# Patient Record
Sex: Female | Born: 1970 | Race: Black or African American | Hispanic: No | Marital: Single | Smoking: Former smoker
Health system: Southern US, Community
[De-identification: ages and names within clinical notes are randomized; demographics above are authoritative.]

## PROBLEM LIST (undated history)

## (undated) DIAGNOSIS — F1910 Other psychoactive substance abuse, uncomplicated: Secondary | ICD-10-CM

## (undated) DIAGNOSIS — M1990 Unspecified osteoarthritis, unspecified site: Secondary | ICD-10-CM

## (undated) DIAGNOSIS — J302 Other seasonal allergic rhinitis: Secondary | ICD-10-CM

## (undated) DIAGNOSIS — M722 Plantar fascial fibromatosis: Secondary | ICD-10-CM

## (undated) DIAGNOSIS — D649 Anemia, unspecified: Secondary | ICD-10-CM

## (undated) DIAGNOSIS — L309 Dermatitis, unspecified: Secondary | ICD-10-CM

## (undated) DIAGNOSIS — I10 Essential (primary) hypertension: Secondary | ICD-10-CM

## (undated) DIAGNOSIS — E785 Hyperlipidemia, unspecified: Secondary | ICD-10-CM

## (undated) DIAGNOSIS — G709 Myoneural disorder, unspecified: Secondary | ICD-10-CM

## (undated) DIAGNOSIS — K219 Gastro-esophageal reflux disease without esophagitis: Secondary | ICD-10-CM

## (undated) DIAGNOSIS — T7840XA Allergy, unspecified, initial encounter: Secondary | ICD-10-CM

## (undated) HISTORY — DX: Anemia, unspecified: D64.9

## (undated) HISTORY — DX: Hyperlipidemia, unspecified: E78.5

## (undated) HISTORY — DX: Plantar fascial fibromatosis: M72.2

## (undated) HISTORY — DX: Unspecified osteoarthritis, unspecified site: M19.90

## (undated) HISTORY — PX: HEMORRHOID SURGERY: SHX153

## (undated) HISTORY — DX: Myoneural disorder, unspecified: G70.9

## (undated) HISTORY — DX: Allergy, unspecified, initial encounter: T78.40XA

---

## 1998-01-14 ENCOUNTER — Emergency Department (HOSPITAL_COMMUNITY): Admission: EM | Admit: 1998-01-14 | Discharge: 1998-01-14 | Payer: Self-pay | Admitting: Family Medicine

## 2000-01-21 ENCOUNTER — Emergency Department (HOSPITAL_COMMUNITY): Admission: EM | Admit: 2000-01-21 | Discharge: 2000-01-21 | Payer: Self-pay | Admitting: Emergency Medicine

## 2000-03-30 DIAGNOSIS — G5760 Lesion of plantar nerve, unspecified lower limb: Secondary | ICD-10-CM | POA: Insufficient documentation

## 2000-12-26 ENCOUNTER — Emergency Department (HOSPITAL_COMMUNITY): Admission: EM | Admit: 2000-12-26 | Discharge: 2000-12-26 | Payer: Self-pay | Admitting: Emergency Medicine

## 2004-02-21 ENCOUNTER — Emergency Department (HOSPITAL_COMMUNITY): Admission: EM | Admit: 2004-02-21 | Discharge: 2004-02-21 | Payer: Self-pay | Admitting: Emergency Medicine

## 2004-03-15 ENCOUNTER — Ambulatory Visit: Payer: Self-pay | Admitting: Internal Medicine

## 2004-03-17 ENCOUNTER — Ambulatory Visit: Payer: Self-pay | Admitting: Internal Medicine

## 2004-03-29 ENCOUNTER — Ambulatory Visit: Payer: Self-pay | Admitting: Internal Medicine

## 2004-04-26 ENCOUNTER — Ambulatory Visit: Payer: Self-pay | Admitting: Internal Medicine

## 2004-05-26 ENCOUNTER — Ambulatory Visit: Payer: Self-pay | Admitting: Internal Medicine

## 2004-06-03 ENCOUNTER — Ambulatory Visit: Payer: Self-pay | Admitting: *Deleted

## 2004-06-25 ENCOUNTER — Ambulatory Visit: Payer: Self-pay | Admitting: Internal Medicine

## 2004-06-25 DIAGNOSIS — M109 Gout, unspecified: Secondary | ICD-10-CM

## 2004-06-26 ENCOUNTER — Ambulatory Visit (HOSPITAL_COMMUNITY): Admission: RE | Admit: 2004-06-26 | Discharge: 2004-06-26 | Payer: Self-pay | Admitting: Internal Medicine

## 2004-07-22 ENCOUNTER — Ambulatory Visit: Payer: Self-pay | Admitting: Internal Medicine

## 2004-11-22 ENCOUNTER — Ambulatory Visit: Payer: Self-pay | Admitting: Internal Medicine

## 2005-02-28 ENCOUNTER — Ambulatory Visit: Payer: Self-pay | Admitting: Internal Medicine

## 2005-03-28 ENCOUNTER — Encounter: Payer: Self-pay | Admitting: Internal Medicine

## 2005-03-29 ENCOUNTER — Inpatient Hospital Stay (HOSPITAL_COMMUNITY): Admission: AD | Admit: 2005-03-29 | Discharge: 2005-03-31 | Payer: Self-pay | Admitting: Internal Medicine

## 2005-07-12 ENCOUNTER — Ambulatory Visit: Payer: Self-pay | Admitting: Internal Medicine

## 2006-03-23 ENCOUNTER — Ambulatory Visit (HOSPITAL_COMMUNITY): Admission: RE | Admit: 2006-03-23 | Discharge: 2006-03-23 | Payer: Self-pay | Admitting: Internal Medicine

## 2006-03-23 ENCOUNTER — Ambulatory Visit: Payer: Self-pay | Admitting: Internal Medicine

## 2006-03-23 DIAGNOSIS — M545 Low back pain: Secondary | ICD-10-CM

## 2006-05-22 ENCOUNTER — Ambulatory Visit: Payer: Self-pay | Admitting: Internal Medicine

## 2006-08-01 ENCOUNTER — Encounter (INDEPENDENT_AMBULATORY_CARE_PROVIDER_SITE_OTHER): Payer: Self-pay | Admitting: Internal Medicine

## 2006-08-01 ENCOUNTER — Ambulatory Visit: Payer: Self-pay | Admitting: Internal Medicine

## 2006-08-01 LAB — CONVERTED CEMR LAB: Pap Smear: NORMAL

## 2006-09-14 ENCOUNTER — Ambulatory Visit: Payer: Self-pay | Admitting: Internal Medicine

## 2006-09-14 DIAGNOSIS — E119 Type 2 diabetes mellitus without complications: Secondary | ICD-10-CM

## 2006-09-14 DIAGNOSIS — R945 Abnormal results of liver function studies: Secondary | ICD-10-CM | POA: Insufficient documentation

## 2006-09-14 LAB — CONVERTED CEMR LAB
ALT: 58 units/L
AST: 65 units/L
Alkaline Phosphatase: 125 units/L
BUN: 17 mg/dL
Cholesterol: 172 mg/dL
Creatinine, Ser: 0.72 mg/dL
Ferritin: 25 ng/mL
HCT: 37.2 %
HCV Ab: NEGATIVE
HDL: 69 mg/dL
Hemoglobin: 11.8 g/dL
Hep A IgM: NEGATIVE
Hep B C IgM: NEGATIVE
Hepatitis B Surface Ag: NEGATIVE
Iron: 64 ug/dL
LDL Cholesterol: 84 mg/dL
MCV: 86.3 fL
Platelets: 230 10*3/uL
RDW: 16.3 %
Saturation Ratios: 13 %
TIBC: 477 ug/dL
TSH: 1.699 microintl units/mL
Triglycerides: 96 mg/dL
UIBC: 413 ug/dL
WBC: 6.8 10*3/uL

## 2006-10-17 ENCOUNTER — Ambulatory Visit: Payer: Self-pay | Admitting: Internal Medicine

## 2006-10-17 LAB — CONVERTED CEMR LAB
ALT: 34 units/L
AST: 30 units/L
Alkaline Phosphatase: 125 units/L
HCT: 38.9 %
Hemoglobin: 12.6 g/dL
MCV: 86.1 fL
Platelets: 241 10*3/uL
RDW: 16.9 %
WBC: 8.9 10*3/uL

## 2006-10-24 ENCOUNTER — Ambulatory Visit: Payer: Self-pay | Admitting: Family Medicine

## 2006-11-28 ENCOUNTER — Ambulatory Visit: Payer: Self-pay | Admitting: Internal Medicine

## 2006-11-28 DIAGNOSIS — K5900 Constipation, unspecified: Secondary | ICD-10-CM | POA: Insufficient documentation

## 2006-11-28 DIAGNOSIS — L2089 Other atopic dermatitis: Secondary | ICD-10-CM

## 2006-11-28 DIAGNOSIS — K219 Gastro-esophageal reflux disease without esophagitis: Secondary | ICD-10-CM

## 2006-11-28 DIAGNOSIS — K648 Other hemorrhoids: Secondary | ICD-10-CM | POA: Insufficient documentation

## 2006-11-28 DIAGNOSIS — F17200 Nicotine dependence, unspecified, uncomplicated: Secondary | ICD-10-CM | POA: Insufficient documentation

## 2006-11-28 DIAGNOSIS — Z9189 Other specified personal risk factors, not elsewhere classified: Secondary | ICD-10-CM | POA: Insufficient documentation

## 2006-11-28 DIAGNOSIS — J301 Allergic rhinitis due to pollen: Secondary | ICD-10-CM | POA: Insufficient documentation

## 2006-11-28 DIAGNOSIS — I10 Essential (primary) hypertension: Secondary | ICD-10-CM | POA: Insufficient documentation

## 2006-12-05 ENCOUNTER — Ambulatory Visit: Payer: Self-pay | Admitting: Internal Medicine

## 2006-12-12 ENCOUNTER — Ambulatory Visit: Payer: Self-pay | Admitting: Internal Medicine

## 2006-12-19 ENCOUNTER — Encounter: Admission: RE | Admit: 2006-12-19 | Discharge: 2007-01-18 | Payer: Self-pay | Admitting: Internal Medicine

## 2007-01-02 ENCOUNTER — Emergency Department (HOSPITAL_COMMUNITY): Admission: EM | Admit: 2007-01-02 | Discharge: 2007-01-02 | Payer: Self-pay | Admitting: Emergency Medicine

## 2007-01-09 ENCOUNTER — Ambulatory Visit: Payer: Self-pay | Admitting: Internal Medicine

## 2007-02-21 ENCOUNTER — Encounter (INDEPENDENT_AMBULATORY_CARE_PROVIDER_SITE_OTHER): Payer: Self-pay | Admitting: *Deleted

## 2007-03-09 ENCOUNTER — Telehealth (INDEPENDENT_AMBULATORY_CARE_PROVIDER_SITE_OTHER): Payer: Self-pay | Admitting: *Deleted

## 2007-03-21 ENCOUNTER — Telehealth (INDEPENDENT_AMBULATORY_CARE_PROVIDER_SITE_OTHER): Payer: Self-pay | Admitting: Internal Medicine

## 2007-04-03 ENCOUNTER — Ambulatory Visit: Payer: Self-pay | Admitting: Internal Medicine

## 2007-04-03 DIAGNOSIS — D509 Iron deficiency anemia, unspecified: Secondary | ICD-10-CM

## 2007-04-03 DIAGNOSIS — N898 Other specified noninflammatory disorders of vagina: Secondary | ICD-10-CM | POA: Insufficient documentation

## 2007-04-09 LAB — CONVERTED CEMR LAB
Basophils Absolute: 0 10*3/uL (ref 0.0–0.1)
Basophils Relative: 0 % (ref 0–1)
Eosinophils Absolute: 0.2 10*3/uL (ref 0.0–0.7)
Eosinophils Relative: 2 % (ref 0–5)
HCT: 40.8 % (ref 36.0–46.0)
Hemoglobin: 13.3 g/dL (ref 12.0–15.0)
Lymphocytes Relative: 32 % (ref 12–46)
Lymphs Abs: 3.7 10*3/uL — ABNORMAL HIGH (ref 0.7–3.3)
MCHC: 32.6 g/dL (ref 30.0–36.0)
MCV: 89.1 fL (ref 78.0–100.0)
Monocytes Absolute: 1.1 10*3/uL — ABNORMAL HIGH (ref 0.2–0.7)
Monocytes Relative: 10 % (ref 3–11)
Neutro Abs: 6.7 10*3/uL (ref 1.7–7.7)
Neutrophils Relative %: 57 % (ref 43–77)
Platelets: 236 10*3/uL (ref 150–400)
RBC: 4.58 M/uL (ref 3.87–5.11)
RDW: 14.4 % — ABNORMAL HIGH (ref 11.5–14.0)
WBC: 11.8 10*3/uL — ABNORMAL HIGH (ref 4.0–10.5)

## 2007-04-10 ENCOUNTER — Telehealth (INDEPENDENT_AMBULATORY_CARE_PROVIDER_SITE_OTHER): Payer: Self-pay | Admitting: Internal Medicine

## 2007-04-13 ENCOUNTER — Encounter: Admission: RE | Admit: 2007-04-13 | Discharge: 2007-05-22 | Payer: Self-pay | Admitting: Internal Medicine

## 2007-04-13 ENCOUNTER — Encounter (INDEPENDENT_AMBULATORY_CARE_PROVIDER_SITE_OTHER): Payer: Self-pay | Admitting: Internal Medicine

## 2007-04-25 ENCOUNTER — Telehealth (INDEPENDENT_AMBULATORY_CARE_PROVIDER_SITE_OTHER): Payer: Self-pay | Admitting: *Deleted

## 2007-05-01 ENCOUNTER — Ambulatory Visit: Payer: Self-pay | Admitting: Internal Medicine

## 2007-05-01 DIAGNOSIS — N979 Female infertility, unspecified: Secondary | ICD-10-CM | POA: Insufficient documentation

## 2007-05-09 ENCOUNTER — Ambulatory Visit (HOSPITAL_COMMUNITY): Admission: RE | Admit: 2007-05-09 | Discharge: 2007-05-09 | Payer: Self-pay | Admitting: Internal Medicine

## 2007-05-10 ENCOUNTER — Encounter (INDEPENDENT_AMBULATORY_CARE_PROVIDER_SITE_OTHER): Payer: Self-pay | Admitting: Internal Medicine

## 2007-05-10 LAB — CONVERTED CEMR LAB
Chlamydia, DNA Probe: NEGATIVE
GC Probe Amp, Genital: NEGATIVE
Microalb, Ur: 0.89 mg/dL (ref 0.00–1.89)

## 2007-05-24 ENCOUNTER — Telehealth (INDEPENDENT_AMBULATORY_CARE_PROVIDER_SITE_OTHER): Payer: Self-pay | Admitting: Internal Medicine

## 2007-06-18 ENCOUNTER — Encounter (INDEPENDENT_AMBULATORY_CARE_PROVIDER_SITE_OTHER): Payer: Self-pay | Admitting: Internal Medicine

## 2007-09-06 ENCOUNTER — Ambulatory Visit: Payer: Self-pay | Admitting: Internal Medicine

## 2007-09-06 ENCOUNTER — Encounter (INDEPENDENT_AMBULATORY_CARE_PROVIDER_SITE_OTHER): Payer: Self-pay | Admitting: Internal Medicine

## 2007-09-06 LAB — CONVERTED CEMR LAB
Blood Glucose, Fingerstick: 170
Chlamydia, DNA Probe: NEGATIVE
GC Probe Amp, Genital: NEGATIVE
Hgb A1c MFr Bld: 7 %
KOH Prep: NEGATIVE
Whiff Test: POSITIVE

## 2007-09-11 ENCOUNTER — Encounter (INDEPENDENT_AMBULATORY_CARE_PROVIDER_SITE_OTHER): Payer: Self-pay | Admitting: Internal Medicine

## 2007-09-28 ENCOUNTER — Encounter (INDEPENDENT_AMBULATORY_CARE_PROVIDER_SITE_OTHER): Payer: Self-pay | Admitting: Internal Medicine

## 2007-10-02 ENCOUNTER — Telehealth (INDEPENDENT_AMBULATORY_CARE_PROVIDER_SITE_OTHER): Payer: Self-pay | Admitting: Internal Medicine

## 2007-10-12 ENCOUNTER — Encounter (INDEPENDENT_AMBULATORY_CARE_PROVIDER_SITE_OTHER): Payer: Self-pay | Admitting: Internal Medicine

## 2007-11-22 ENCOUNTER — Ambulatory Visit: Payer: Self-pay | Admitting: Internal Medicine

## 2007-11-22 DIAGNOSIS — B373 Candidiasis of vulva and vagina: Secondary | ICD-10-CM

## 2007-11-22 LAB — CONVERTED CEMR LAB
KOH Prep: NEGATIVE
Whiff Test: NEGATIVE

## 2007-12-20 ENCOUNTER — Emergency Department (HOSPITAL_BASED_OUTPATIENT_CLINIC_OR_DEPARTMENT_OTHER): Admission: EM | Admit: 2007-12-20 | Discharge: 2007-12-20 | Payer: Self-pay | Admitting: Emergency Medicine

## 2008-01-25 ENCOUNTER — Telehealth (INDEPENDENT_AMBULATORY_CARE_PROVIDER_SITE_OTHER): Payer: Self-pay | Admitting: Internal Medicine

## 2008-03-04 ENCOUNTER — Ambulatory Visit: Payer: Self-pay | Admitting: Internal Medicine

## 2008-03-04 DIAGNOSIS — T7840XA Allergy, unspecified, initial encounter: Secondary | ICD-10-CM | POA: Insufficient documentation

## 2008-03-04 LAB — CONVERTED CEMR LAB
Blood Glucose, Fingerstick: 220
Chlamydia, DNA Probe: NEGATIVE
GC Probe Amp, Genital: NEGATIVE

## 2008-03-19 ENCOUNTER — Telehealth (INDEPENDENT_AMBULATORY_CARE_PROVIDER_SITE_OTHER): Payer: Self-pay | Admitting: Internal Medicine

## 2008-03-19 ENCOUNTER — Encounter (INDEPENDENT_AMBULATORY_CARE_PROVIDER_SITE_OTHER): Payer: Self-pay | Admitting: Nurse Practitioner

## 2008-03-19 ENCOUNTER — Ambulatory Visit: Payer: Self-pay | Admitting: Internal Medicine

## 2008-03-19 DIAGNOSIS — L0100 Impetigo, unspecified: Secondary | ICD-10-CM

## 2008-03-20 ENCOUNTER — Encounter (INDEPENDENT_AMBULATORY_CARE_PROVIDER_SITE_OTHER): Payer: Self-pay | Admitting: Nurse Practitioner

## 2008-03-26 ENCOUNTER — Emergency Department (HOSPITAL_COMMUNITY): Admission: EM | Admit: 2008-03-26 | Discharge: 2008-03-26 | Payer: Self-pay | Admitting: Emergency Medicine

## 2008-03-26 ENCOUNTER — Telehealth (INDEPENDENT_AMBULATORY_CARE_PROVIDER_SITE_OTHER): Payer: Self-pay | Admitting: Internal Medicine

## 2008-04-01 ENCOUNTER — Telehealth (INDEPENDENT_AMBULATORY_CARE_PROVIDER_SITE_OTHER): Payer: Self-pay | Admitting: Nurse Practitioner

## 2008-04-22 ENCOUNTER — Ambulatory Visit: Payer: Self-pay | Admitting: Internal Medicine

## 2008-04-24 ENCOUNTER — Encounter (INDEPENDENT_AMBULATORY_CARE_PROVIDER_SITE_OTHER): Payer: Self-pay | Admitting: Internal Medicine

## 2008-05-22 ENCOUNTER — Ambulatory Visit: Payer: Self-pay | Admitting: Internal Medicine

## 2008-05-22 DIAGNOSIS — L259 Unspecified contact dermatitis, unspecified cause: Secondary | ICD-10-CM | POA: Insufficient documentation

## 2008-06-19 ENCOUNTER — Ambulatory Visit (HOSPITAL_COMMUNITY): Admission: RE | Admit: 2008-06-19 | Discharge: 2008-06-19 | Payer: Self-pay | Admitting: Internal Medicine

## 2008-07-18 ENCOUNTER — Emergency Department (HOSPITAL_BASED_OUTPATIENT_CLINIC_OR_DEPARTMENT_OTHER): Admission: EM | Admit: 2008-07-18 | Discharge: 2008-07-18 | Payer: Self-pay | Admitting: Emergency Medicine

## 2008-07-29 ENCOUNTER — Telehealth (INDEPENDENT_AMBULATORY_CARE_PROVIDER_SITE_OTHER): Payer: Self-pay | Admitting: Internal Medicine

## 2008-09-08 ENCOUNTER — Ambulatory Visit: Payer: Self-pay | Admitting: Family Medicine

## 2008-09-12 ENCOUNTER — Ambulatory Visit: Payer: Self-pay | Admitting: Internal Medicine

## 2008-09-12 ENCOUNTER — Ambulatory Visit (HOSPITAL_COMMUNITY): Admission: RE | Admit: 2008-09-12 | Discharge: 2008-09-12 | Payer: Self-pay | Admitting: Internal Medicine

## 2008-09-12 LAB — CONVERTED CEMR LAB: Blood Glucose, Fingerstick: 147

## 2008-09-19 ENCOUNTER — Encounter (INDEPENDENT_AMBULATORY_CARE_PROVIDER_SITE_OTHER): Payer: Self-pay | Admitting: Internal Medicine

## 2008-09-22 ENCOUNTER — Telehealth (INDEPENDENT_AMBULATORY_CARE_PROVIDER_SITE_OTHER): Payer: Self-pay | Admitting: Internal Medicine

## 2008-09-28 LAB — CONVERTED CEMR LAB
Alkaline Phosphatase: 126 units/L — ABNORMAL HIGH (ref 39–117)
BUN: 13 mg/dL (ref 6–23)
Basophils Absolute: 0 10*3/uL (ref 0.0–0.1)
Basophils Relative: 0 % (ref 0–1)
Glucose, Bld: 116 mg/dL — ABNORMAL HIGH (ref 70–99)
Hemoglobin: 12.3 g/dL (ref 12.0–15.0)
MCHC: 32.2 g/dL (ref 30.0–36.0)
Monocytes Absolute: 0.7 10*3/uL (ref 0.1–1.0)
Neutro Abs: 4.6 10*3/uL (ref 1.7–7.7)
Platelets: 240 10*3/uL (ref 150–400)
RDW: 14.9 % (ref 11.5–15.5)
Sodium: 140 meq/L (ref 135–145)
Total Bilirubin: 0.3 mg/dL (ref 0.3–1.2)
Total Protein: 7.4 g/dL (ref 6.0–8.3)

## 2008-09-29 ENCOUNTER — Telehealth (INDEPENDENT_AMBULATORY_CARE_PROVIDER_SITE_OTHER): Payer: Self-pay | Admitting: Internal Medicine

## 2008-10-01 ENCOUNTER — Encounter: Admission: RE | Admit: 2008-10-01 | Discharge: 2008-11-14 | Payer: Self-pay | Admitting: Internal Medicine

## 2008-10-01 ENCOUNTER — Encounter (INDEPENDENT_AMBULATORY_CARE_PROVIDER_SITE_OTHER): Payer: Self-pay | Admitting: Internal Medicine

## 2008-10-07 ENCOUNTER — Encounter (INDEPENDENT_AMBULATORY_CARE_PROVIDER_SITE_OTHER): Payer: Self-pay | Admitting: Internal Medicine

## 2008-10-09 ENCOUNTER — Encounter (INDEPENDENT_AMBULATORY_CARE_PROVIDER_SITE_OTHER): Payer: Self-pay | Admitting: Internal Medicine

## 2008-10-15 ENCOUNTER — Encounter (INDEPENDENT_AMBULATORY_CARE_PROVIDER_SITE_OTHER): Payer: Self-pay | Admitting: Internal Medicine

## 2008-10-20 ENCOUNTER — Ambulatory Visit (HOSPITAL_COMMUNITY): Admission: RE | Admit: 2008-10-20 | Discharge: 2008-10-20 | Payer: Self-pay | Admitting: Internal Medicine

## 2008-10-20 DIAGNOSIS — K7689 Other specified diseases of liver: Secondary | ICD-10-CM

## 2008-10-20 DIAGNOSIS — K760 Fatty (change of) liver, not elsewhere classified: Secondary | ICD-10-CM | POA: Insufficient documentation

## 2008-10-30 ENCOUNTER — Ambulatory Visit: Payer: Self-pay | Admitting: Internal Medicine

## 2008-11-04 ENCOUNTER — Encounter (INDEPENDENT_AMBULATORY_CARE_PROVIDER_SITE_OTHER): Payer: Self-pay | Admitting: Internal Medicine

## 2008-11-06 ENCOUNTER — Encounter (INDEPENDENT_AMBULATORY_CARE_PROVIDER_SITE_OTHER): Payer: Self-pay | Admitting: Internal Medicine

## 2008-11-06 ENCOUNTER — Inpatient Hospital Stay (HOSPITAL_COMMUNITY): Admission: AD | Admit: 2008-11-06 | Discharge: 2008-11-06 | Payer: Self-pay | Admitting: Obstetrics & Gynecology

## 2008-11-10 ENCOUNTER — Encounter (INDEPENDENT_AMBULATORY_CARE_PROVIDER_SITE_OTHER): Payer: Self-pay | Admitting: Internal Medicine

## 2008-11-11 LAB — CONVERTED CEMR LAB
Cholesterol: 187 mg/dL (ref 0–200)
Total CHOL/HDL Ratio: 2.4
Triglycerides: 89 mg/dL (ref <150)
VLDL: 18 mg/dL (ref 0–40)

## 2008-11-13 ENCOUNTER — Telehealth (INDEPENDENT_AMBULATORY_CARE_PROVIDER_SITE_OTHER): Payer: Self-pay | Admitting: Internal Medicine

## 2008-11-20 ENCOUNTER — Ambulatory Visit: Payer: Self-pay | Admitting: Obstetrics & Gynecology

## 2008-12-05 ENCOUNTER — Ambulatory Visit: Payer: Self-pay | Admitting: Internal Medicine

## 2008-12-05 DIAGNOSIS — E1169 Type 2 diabetes mellitus with other specified complication: Secondary | ICD-10-CM | POA: Insufficient documentation

## 2008-12-05 DIAGNOSIS — E785 Hyperlipidemia, unspecified: Secondary | ICD-10-CM

## 2008-12-10 ENCOUNTER — Encounter (INDEPENDENT_AMBULATORY_CARE_PROVIDER_SITE_OTHER): Payer: Self-pay | Admitting: Internal Medicine

## 2008-12-12 ENCOUNTER — Encounter (INDEPENDENT_AMBULATORY_CARE_PROVIDER_SITE_OTHER): Payer: Self-pay | Admitting: Internal Medicine

## 2008-12-15 ENCOUNTER — Telehealth (INDEPENDENT_AMBULATORY_CARE_PROVIDER_SITE_OTHER): Payer: Self-pay | Admitting: Internal Medicine

## 2009-02-02 ENCOUNTER — Telehealth (INDEPENDENT_AMBULATORY_CARE_PROVIDER_SITE_OTHER): Payer: Self-pay | Admitting: Internal Medicine

## 2009-02-10 ENCOUNTER — Encounter (INDEPENDENT_AMBULATORY_CARE_PROVIDER_SITE_OTHER): Payer: Self-pay | Admitting: Internal Medicine

## 2009-02-10 ENCOUNTER — Ambulatory Visit: Payer: Self-pay | Admitting: Internal Medicine

## 2009-02-10 LAB — CONVERTED CEMR LAB
LDL Cholesterol: 41 mg/dL (ref 0–99)
Total CHOL/HDL Ratio: 1.7
VLDL: 20 mg/dL (ref 0–40)

## 2009-02-19 ENCOUNTER — Encounter (INDEPENDENT_AMBULATORY_CARE_PROVIDER_SITE_OTHER): Payer: Self-pay | Admitting: Internal Medicine

## 2009-03-19 ENCOUNTER — Telehealth (INDEPENDENT_AMBULATORY_CARE_PROVIDER_SITE_OTHER): Payer: Self-pay | Admitting: Internal Medicine

## 2009-03-30 ENCOUNTER — Ambulatory Visit: Payer: Self-pay | Admitting: Internal Medicine

## 2009-04-10 ENCOUNTER — Encounter (INDEPENDENT_AMBULATORY_CARE_PROVIDER_SITE_OTHER): Payer: Self-pay | Admitting: *Deleted

## 2009-05-18 ENCOUNTER — Telehealth (INDEPENDENT_AMBULATORY_CARE_PROVIDER_SITE_OTHER): Payer: Self-pay | Admitting: Internal Medicine

## 2009-06-23 ENCOUNTER — Telehealth (INDEPENDENT_AMBULATORY_CARE_PROVIDER_SITE_OTHER): Payer: Self-pay | Admitting: Internal Medicine

## 2009-06-24 ENCOUNTER — Ambulatory Visit (HOSPITAL_COMMUNITY): Admission: RE | Admit: 2009-06-24 | Discharge: 2009-06-24 | Payer: Self-pay | Admitting: Internal Medicine

## 2009-06-26 ENCOUNTER — Encounter (INDEPENDENT_AMBULATORY_CARE_PROVIDER_SITE_OTHER): Payer: Self-pay | Admitting: Internal Medicine

## 2009-06-30 ENCOUNTER — Encounter (INDEPENDENT_AMBULATORY_CARE_PROVIDER_SITE_OTHER): Payer: Self-pay | Admitting: Internal Medicine

## 2009-06-30 LAB — CONVERTED CEMR LAB: HCT: 40 %

## 2009-07-01 LAB — CONVERTED CEMR LAB: Uric Acid, Serum: 10 mg/dL — ABNORMAL HIGH (ref 2.4–7.0)

## 2009-09-18 ENCOUNTER — Ambulatory Visit: Payer: Self-pay | Admitting: Internal Medicine

## 2009-09-23 ENCOUNTER — Ambulatory Visit: Payer: Self-pay | Admitting: Internal Medicine

## 2009-09-23 ENCOUNTER — Encounter (INDEPENDENT_AMBULATORY_CARE_PROVIDER_SITE_OTHER): Payer: Self-pay | Admitting: Internal Medicine

## 2009-09-24 ENCOUNTER — Telehealth (INDEPENDENT_AMBULATORY_CARE_PROVIDER_SITE_OTHER): Payer: Self-pay | Admitting: Internal Medicine

## 2009-10-01 ENCOUNTER — Ambulatory Visit: Payer: Self-pay | Admitting: Internal Medicine

## 2009-10-01 ENCOUNTER — Telehealth (INDEPENDENT_AMBULATORY_CARE_PROVIDER_SITE_OTHER): Payer: Self-pay | Admitting: Internal Medicine

## 2009-10-01 DIAGNOSIS — R29818 Other symptoms and signs involving the nervous system: Secondary | ICD-10-CM | POA: Insufficient documentation

## 2009-11-03 ENCOUNTER — Ambulatory Visit: Payer: Self-pay | Admitting: Internal Medicine

## 2009-11-03 DIAGNOSIS — M7710 Lateral epicondylitis, unspecified elbow: Secondary | ICD-10-CM | POA: Insufficient documentation

## 2009-11-03 DIAGNOSIS — L980 Pyogenic granuloma: Secondary | ICD-10-CM | POA: Insufficient documentation

## 2009-11-04 ENCOUNTER — Encounter (INDEPENDENT_AMBULATORY_CARE_PROVIDER_SITE_OTHER): Payer: Self-pay | Admitting: Internal Medicine

## 2009-11-17 ENCOUNTER — Ambulatory Visit: Payer: Self-pay | Admitting: Internal Medicine

## 2009-11-17 ENCOUNTER — Telehealth (INDEPENDENT_AMBULATORY_CARE_PROVIDER_SITE_OTHER): Payer: Self-pay | Admitting: Internal Medicine

## 2009-11-30 ENCOUNTER — Encounter: Admission: RE | Admit: 2009-11-30 | Discharge: 2010-01-06 | Payer: Self-pay | Admitting: Internal Medicine

## 2009-11-30 ENCOUNTER — Encounter (INDEPENDENT_AMBULATORY_CARE_PROVIDER_SITE_OTHER): Payer: Self-pay | Admitting: Internal Medicine

## 2009-12-12 ENCOUNTER — Encounter (INDEPENDENT_AMBULATORY_CARE_PROVIDER_SITE_OTHER): Payer: Self-pay | Admitting: Internal Medicine

## 2009-12-15 ENCOUNTER — Ambulatory Visit: Payer: Self-pay | Admitting: Internal Medicine

## 2009-12-15 LAB — CONVERTED CEMR LAB
AST: 21 units/L (ref 0–37)
Albumin: 4.2 g/dL (ref 3.5–5.2)
HDL: 61 mg/dL (ref >39)
LDL Cholesterol: 33 mg/dL (ref 0–99)
Total Bilirubin: 0.2 mg/dL — ABNORMAL LOW (ref 0.3–1.2)
Total CHOL/HDL Ratio: 1.8

## 2009-12-31 ENCOUNTER — Encounter (INDEPENDENT_AMBULATORY_CARE_PROVIDER_SITE_OTHER): Payer: Self-pay | Admitting: Internal Medicine

## 2010-01-06 ENCOUNTER — Encounter (INDEPENDENT_AMBULATORY_CARE_PROVIDER_SITE_OTHER): Payer: Self-pay | Admitting: Internal Medicine

## 2010-03-03 ENCOUNTER — Telehealth (INDEPENDENT_AMBULATORY_CARE_PROVIDER_SITE_OTHER): Payer: Self-pay | Admitting: Internal Medicine

## 2010-03-26 ENCOUNTER — Ambulatory Visit: Payer: Self-pay | Admitting: Internal Medicine

## 2010-03-26 LAB — CONVERTED CEMR LAB
Blood Glucose, Fingerstick: 229
Hgb A1c MFr Bld: 8.2 % — ABNORMAL HIGH (ref <5.7)

## 2010-04-10 ENCOUNTER — Encounter (INDEPENDENT_AMBULATORY_CARE_PROVIDER_SITE_OTHER): Payer: Self-pay | Admitting: Internal Medicine

## 2010-04-27 ENCOUNTER — Telehealth (INDEPENDENT_AMBULATORY_CARE_PROVIDER_SITE_OTHER): Payer: Self-pay | Admitting: Internal Medicine

## 2010-05-10 ENCOUNTER — Ambulatory Visit: Payer: Self-pay | Admitting: Internal Medicine

## 2010-05-10 ENCOUNTER — Telehealth (INDEPENDENT_AMBULATORY_CARE_PROVIDER_SITE_OTHER): Payer: Self-pay | Admitting: Internal Medicine

## 2010-05-10 LAB — CONVERTED CEMR LAB: Blood Glucose, Fingerstick: 188

## 2010-05-19 ENCOUNTER — Telehealth (INDEPENDENT_AMBULATORY_CARE_PROVIDER_SITE_OTHER): Payer: Self-pay | Admitting: Internal Medicine

## 2010-06-21 ENCOUNTER — Telehealth (INDEPENDENT_AMBULATORY_CARE_PROVIDER_SITE_OTHER): Payer: Self-pay | Admitting: Internal Medicine

## 2010-06-23 LAB — COMPREHENSIVE METABOLIC PANEL
ALT: 53 U/L — ABNORMAL HIGH (ref 0–35)
AST: 67 U/L — ABNORMAL HIGH (ref 0–37)
Albumin: 4.3 g/dL (ref 3.5–5.2)
Alkaline Phosphatase: 136 U/L — ABNORMAL HIGH (ref 39–117)
BUN: 14 mg/dL (ref 6–23)
CO2: 25 mEq/L (ref 19–32)
Calcium: 10.3 mg/dL (ref 8.4–10.5)
Chloride: 102 mEq/L (ref 96–112)
Creatinine, Ser: 0.92 mg/dL (ref 0.4–1.2)
GFR calc Af Amer: >60 mL/min (ref >60)
GFR calc non Af Amer: >60 mL/min (ref >60)
Glucose, Bld: 384 mg/dL — ABNORMAL HIGH (ref 70–99)
Potassium: 4.4 mEq/L (ref 3.5–5.1)
Sodium: 139 mEq/L (ref 135–145)
Total Bilirubin: 0.4 mg/dL (ref 0.3–1.2)
Total Protein: 8.1 g/dL (ref 6.0–8.3)

## 2010-06-23 LAB — CBC
HCT: 39.7 % (ref 36.0–46.0)
Hemoglobin: 13.2 g/dL (ref 12.0–15.0)
MCH: 28.5 pg (ref 26.0–34.0)
MCHC: 33.2 g/dL (ref 30.0–36.0)
MCV: 85.7 fL (ref 78.0–100.0)
Platelets: 213 10*3/uL (ref 150–400)
RBC: 4.63 MIL/uL (ref 3.87–5.11)
RDW: 13.5 % (ref 11.5–15.5)
WBC: 10.8 10*3/uL — ABNORMAL HIGH (ref 4.0–10.5)

## 2010-06-23 LAB — SURGICAL PCR SCREEN
MRSA, PCR: NEGATIVE
Staphylococcus aureus: POSITIVE — AB

## 2010-06-23 LAB — PREGNANCY, URINE: Preg Test, Ur: NEGATIVE

## 2010-06-27 ENCOUNTER — Encounter: Payer: Self-pay | Admitting: Internal Medicine

## 2010-06-28 ENCOUNTER — Encounter: Payer: Self-pay | Admitting: Internal Medicine

## 2010-07-02 ENCOUNTER — Ambulatory Visit (HOSPITAL_COMMUNITY)
Admission: RE | Admit: 2010-07-02 | Discharge: 2010-07-02 | Payer: Self-pay | Source: Home / Self Care | Attending: General Surgery | Admitting: General Surgery

## 2010-07-02 LAB — GLUCOSE, CAPILLARY
Glucose-Capillary: 400 mg/dL — ABNORMAL HIGH (ref 70–99)
Glucose-Capillary: 257 mg/dL — ABNORMAL HIGH (ref 70–99)
Glucose-Capillary: 243 mg/dL — ABNORMAL HIGH (ref 70–99)
Glucose-Capillary: 395 mg/dL — ABNORMAL HIGH (ref 70–99)

## 2010-07-05 ENCOUNTER — Telehealth (INDEPENDENT_AMBULATORY_CARE_PROVIDER_SITE_OTHER): Payer: Self-pay | Admitting: Internal Medicine

## 2010-07-05 NOTE — Op Note (Signed)
NAME:  Deborah [name], Deborah [name]               ACCOUNT NO.:  [id number]  MEDICAL RECORD NO.:  [id number]          PATIENT TYPE:  AMB  LOCATION:  DAY                          FACILITY:  [facility]  PHYSICIAN:  Sharlet Salina T. Marites Nath, M.D.DATE OF BIRTH:  11-Apr-1971  DATE OF PROCEDURE:  07/02/2010 DATE OF DISCHARGE:                              OPERATIVE REPORT   PREOPERATIVE DIAGNOSIS:  Grade 4 internal hemorrhoids.  POSTOPERATIVE DIAGNOSIS:  Grade 4 internal hemorrhoids.  SURGICAL PROCEDURE:  Procedure for prolapse and hemorrhoids.  SURGEON:  Lorne Skeens. Isadora Delorey, MD  ANESTHESIA:  General.  BRIEF HISTORY:  Deborah [name] is a 40 year old female who presents with a long history of steadily worsening hemorrhoidal symptoms.  She now presents with spontaneously prolapsing hemorrhoids with each bowel movement which she has to manually reduce.  This has been demonstrated in the office with circumferential grade 4 internal hemorrhoids.  [name] discussed options for treatment and I have elected to proceed with PPH.  We discussed the nature of the procedure, its indications, expected results, risks of anesthetic complications, bleeding, infection, failure to relieve symptoms, pain and small risk of a rectovaginal fistula.  She is now brought to the operating room for this procedure.  DESCRIPTION OF OPERATION:  The patient brought to operating room and general orotracheal anesthesia was induced in the stretcher.  She was carefully rolled into a prone jack-knife position.  The buttocks were draped apart and then rectum and perirectal area was widely sterilely prepped and draped.  PAS were placed.  Correct patient and procedure were verified.  Initially, the hemorrhoidal groups were infiltrated with Marcaine and Wydase.  Following this, a perirectal block with 30 mL of Marcaine was performed.  The anus was then slowly carefully dilated to three fingers.  The large bullet rectal retractor was placed  and circumferential internal hemorrhoids were verified.  No other pathology was seen.  A 2-0 Prolene pursestring suture was then placed carefully measured 5 cm proximal to the dentate line, taking mucosa and submucosa circumferentially around the rectum.  Following this, the bone retractor was removed.  The pursestring suture was palpated and was intact and symmetrical.  The PPH retractor was then placed and the pursestring suture was again seen to be intact and symmetric.  The stapler anvil was then introduced through the pursestring which was tightened and secured into place.  The stapler was then closed advancing it into the rectum about 5 cm.  I then carefully palpated the rectovaginal septum through the vagina and there was no evidence of any impingement upon the vagina. After leaving the staple in place for 1 minute, it was fired, opened and removed.  The specimen was examined and there was a nice approximately 2 to 2-1/2 cm thick cuff of mucosa and submucosa.  At this point, the bullet retractor was replaced and the staple line was carefully examined circumferentially.  There was one area of  minimal bleeding anteriorly that was controlled with a figure- of-eight Vicryl suture.  Staple line was widely patent and seen to be several centimeters above the dentate line.  A Gelfoam and lidocaine pack was placed.  Sponge  and needle counts were correct.  The patient was taken to the recovery in good condition.     Lorne Skeens. Baljit Liebert, M.D.     Tory Emerald  D:  07/02/2010  T:  07/02/2010  Job:  [id number]  Electronically Signed by Glenna Fellows M.D. on 07/05/2010 02:03:57 PM

## 2010-07-06 ENCOUNTER — Encounter (INDEPENDENT_AMBULATORY_CARE_PROVIDER_SITE_OTHER): Payer: Self-pay | Admitting: Internal Medicine

## 2010-07-06 ENCOUNTER — Encounter: Payer: Self-pay | Admitting: Internal Medicine

## 2010-07-06 DIAGNOSIS — L293 Anogenital pruritus, unspecified: Secondary | ICD-10-CM | POA: Insufficient documentation

## 2010-07-06 LAB — CONVERTED CEMR LAB
Bilirubin Urine: NEGATIVE
Nitrite: NEGATIVE
Specific Gravity, Urine: >=1.03
Urobilinogen, UA: 0.2
Whiff Test: NEGATIVE
pH: 5

## 2010-07-06 NOTE — Progress Notes (Signed)
Summary: VERY PAINFUL HEMROIDS  Phone Note Call from Patient Call back at [phone]   Summary of Call: PLEASE CALL PATIENT MEDICINE IS NOT WORKING SHE IS HAVING COMPLICATIONS WITH HEMORRHOIS, SHE IS BLEEDING EVERITIME SHE IS GOING TO THE BATHROOM, SHE ASK FOR APPOINTMENT A GIVE HER [phone] BUT SHE SAID SHE NEED A SOONER ONE I DID NOT SCHEDULE , HER APP FOR PE IS 07-29-10 AT 9:00 WITH DR Percy Winterrowd  PLEASE CALL HER AT [phone] Initial call taken by: Domenic Polite,  April 27, 2010 10:50 AM  Follow-up for Phone Call        [name] WANTS TO KNOW IF YOU CAN REFER HER TO SEE SOMEONE OR ANYBODY BECAUSE SHE WOULD LIKE TO HAVE SURGERY ON THEM, EVERYTIME SHE HAS A BOWEL MOVEMENT THEY ARE HANGING OUT AND TO EVEN PUSH THEM BACK IS A LOT OF PAIN. NONE OF THE MEDS. THAT WAS PRESCRIBED ISN'T HELPING. Follow-up by: Leodis Rains,  April 27, 2010 11:10 AM  Additional Follow-up for Phone Call Additional follow up Details #1::        Left message with female for patient to return call. Gaylyn Cheers RN  April 28, 2010 12:01 PM  Using anusol daily, advised warm soaks in tub, use moist towlettes on rectum be gently when wiping. Avoid constipation may take Miralax, drink plenty of water and increase fiber in diet. May use vaseline around rectum. Discussed with her they are not going to go away but she needs to take above steps to prevent further problems. Additional Follow-up by: Gaylyn Cheers RN,  April 28, 2010 4:03 PM    Additional Follow-up for Phone Call Additional follow up Details #2::    See if we can get her in next week--if someone cancels or double book.  Make sure she is doing all she can with fiber.  Anusol should be used after each BM up to three times a day.  Julieanne Manson MD  April 28, 2010 2:52 PM   Left message on answer machine for pt. to return call. Gaylyn Cheers RN  April 28, 2010 4:05 PM  Left message on answer machine for pt. to return call. Gaylyn Cheers  RN  May 03, 2010 9:13 AM   Left message with female for  patient to return call. Gaylyn Cheers RN  May 04, 2010 10:46 AM      Additional Follow-up for Phone Call Additional follow up Details #3:: Details for Additional Follow-up Action Taken: Pt. returned call appt. scheduled for Mon. stated bleeding has stopped however her hemorrhoids are "hanging out" Gaylyn Cheers RN  May 04, 2010 12:31 PM

## 2010-07-06 NOTE — Letter (Signed)
Summary: [facility]  [facility]   Imported ByArta Bruce 05/05/2010 16:07:34  _____________________________________________________________________  External Attachment:    Type:   Image     Comment:   External Document

## 2010-07-06 NOTE — Assessment & Plan Note (Signed)
Summary: renew medications/ arm and joint pain//gk   Vital Signs:  Patient profile:   40 year old female Weight:      172 pounds Temp:     97.9 degrees F Pulse rate:   76 / minute Pulse rhythm:   regular Resp:     20 per minute BP sitting:   157 / 99  (left arm) Cuff size:   regular  Vitals Entered By: Vesta Mixer CMA (Nov 03, 2009 10:02 AM) CC: xyzal not helping and arms are hurting and hit left foot on a block of wood and has a sore on it Is Patient Diabetic? Yes Pain Assessment Patient in pain? yes     Location: left foot sore Intensity: 5 CBG Result 212  Does patient need assistance? Ambulation Normal   CC:  xyzal not helping and arms are hurting and hit left foot on a block of wood and has a sore on it.  History of Present Illness: 1.  Productive cough--white to light yellow.  Eyes itchy and burning--used some over the counter medication she found--helped.  Itchy throat.  Is using Xyzal regularly as well as Nasocort.  No fever.  Does have windows closed and air conditioning going in home.    2.  Arm Pain:  Still has this and has  actually progressed since stopping Lipitor end of April.  CPK was okay when checked end of April  3.  Left Foot sore as above.  Center of wound now very tender and bleeds if hit.    Allergies (verified): 1)  ! Keflex 2)  ! Neosporin  Physical Exam  General:  NAD Eyes:  Minimal injection of conjunctivae Ears:  External ear exam shows no significant lesions or deformities.  Otoscopic examination reveals clear canals, tympanic membranes are intact bilaterally without bulging, retraction, inflammation or discharge. Hearing is grossly normal bilaterally. Nose:  Mucosa very swollen and boggy.  Clear discharge Mouth:  Unable to see posterior pharynx well Neck:  No deformities, masses, or tenderness noted. Lungs:  Normal respiratory effort, chest expands symmetrically. Lungs are clear to auscultation, no crackles or wheezes. Heart:  Normal  rate and regular rhythm. S1 and S2 normal without gallop, murmur, click, rub or other extra sounds. Msk:  Tender over bilateral traps and left lateral epidondyle area Extremities:  1 cm area in center of peeling area on posterior left heel with granulomatous type tissue--tender on palpations.  Mild weeping.  Friable appearing.   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 2 tabs by mouth bid 2)  Nexium 40 Mg Cpdr (Esomeprazole magnesium) .Marland Kitchen.. 1 cap by mouth daily 3)  Flexeril 5 Mg Tabs (Cyclobenzaprine hcl) .Marland Kitchen.. 1 tab by mouth q hs as needed pain 4)  Metformin Hcl 500 Mg Tabs (Metformin hcl) .Marland Kitchen.. 1 tab  by mouth two times a day with meals 5)  Xyzal 5 Mg Tabs (Levocetirizine dihydrochloride) .Marland Kitchen.. 1 tab by mouth daily 6)  Naproxen 500 Mg Tabs (Naproxen) .Marland Kitchen.. 1 tab by mouth two times a day with food 7)  Tens Unit  .... Use as directed by pt  dx 724.4 8)  Lipitor 10 Mg Tabs (Atorvastatin calcium) .Marland Kitchen.. 1 tab by mouth daily 9)  Amitriptyline Hcl 25 Mg Tabs (Amitriptyline hcl) .Marland Kitchen.. 1 tab by mouth q hs 10)  Indomethacin 50 Mg Caps (Indomethacin) .Marland Kitchen.. 1 tab by mouth two times a day with food as needed gouty pain 11)  Allopurinol 100 Mg Tabs (Allopurinol) .... 2 tabs by  mouth daily 12)  Nasacort Aq 55 Mcg/act Aers (Triamcinolone acetonide(nasal)) .... 2 sprays each nostril daily 13)  Singulair 10 Mg Tabs (Montelukast sodium) .Marland Kitchen.. 1 tab by mouth daily  Other Orders: Capillary Blood Glucose/CBG (82948) Hemoglobin A1C (16109)  Patient Instructions: 1)  Follow up with Dr. Delrae Alfred in 2 weeks--left heel--call if redness, swelling, discharge, pain, fever 2)  Follow up with lab in 6  weeks  fasting:  FLP and liver 3)  Follow up with Dr. Delrae Alfred in 4 months --follow up of DM, htn, etc Prescriptions: LIPITOR 10 MG TABS (ATORVASTATIN CALCIUM) 1 tab by mouth daily  #30 x 11   Entered and Authorized by:   Julieanne Manson MD   Signed by:   Julieanne Manson MD on 11/03/2009   Method used:   Faxed to ...       [facility] (retail)       [address] [place], [place]  [address]       Ph: [phone]       Fax: [phone]   RxID:   [id number] SINGULAIR 10 MG TABS (MONTELUKAST SODIUM) 1 tab by mouth daily  #30 x 11   Entered and Authorized by:   Julieanne Manson MD   Signed by:   Julieanne Manson MD on 11/03/2009   Method used:   Faxed to ...       [facility] (retail)       [address] [place], [place]  [address]       Ph: [phone]       Fax: [phone]   RxID:   [id number]   Laboratory Results   Blood Tests     HGBA1C: 7.5%   (Normal Range: Non-Diabetic - 3-6%   Control Diabetic - 6-8%) CBG Random:: 212mg /dL

## 2010-07-06 NOTE — Assessment & Plan Note (Signed)
Summary: 2 WEEK FU///KT   Vital Signs:  Patient profile:   40 year old female Weight:      172 pounds Temp:     97.8 degrees F Pulse rate:   77 / minute Pulse rhythm:   regular Resp:     18 per minute BP sitting:   173 / 99  (right arm) Cuff size:   regular  Vitals Entered By: Vesta Mixer CMA (November 17, 2009 2:02 PM)  CC: 2 week f/u Is Patient Diabetic? Yes Pain Assessment Patient in pain? no      CBG Result 220  Does patient need assistance? Ambulation Normal   CC:  2 week f/u.  History of Present Illness: Left heel lesion--felt to be pyogenic granuloma at last visit, but a bit drier on exam than would expect.  Treated with silver nitrate stick.  Itching and weeping gone.  Nontender, but is thickened and pt would like to pull the thickened area away.  Allergies (verified): 1)  ! Keflex 2)  ! Neosporin  Physical Exam  Skin:  1 1/2 cm dark thickened almost scabbed area where treated with silver nitrate.  No weeping, no surrounding erythema, nontender.   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 Very thickened area--may be just the scabbed or dried up area that has not yet fallen off, but with the thickeness and pt. being diabetic will refer to podiatry for further evaluation and possibly debridement. Pt. may soak foot for 20 minutes two times a day to soften area--warm water only. Orders: Podiatry Referral (Podiatry)  Complete Medication List: 1)  Pindolol 5 Mg Tabs (Pindolol) .... 2 tabs by mouth bid 2)  Nexium 40 Mg Cpdr (Esomeprazole magnesium) .Marland Kitchen.. 1 cap by mouth daily 3)  Metformin Hcl 500 Mg Tabs (Metformin hcl) .Marland Kitchen.. 1 tab by mouth two times a day with meals 4)  Xyzal 5 Mg Tabs (Levocetirizine dihydrochloride) .Marland Kitchen.. 1 tab by mouth daily 5)  Tens Unit  .... Use as directed by pt  dx 724.4 6)  Lipitor 10 Mg Tabs (Atorvastatin calcium) .Marland Kitchen.. 1 tab by mouth daily 7)  Indomethacin  50 Mg Caps (Indomethacin) .Marland Kitchen.. 1 tab by mouth two times a day with food as needed gouty pain 8)  Allopurinol 100 Mg Tabs (Allopurinol) .... 2 tabs by mouth daily 9)  Nasacort Aq 55 Mcg/act Aers (Triamcinolone acetonide(nasal)) .... 2 sprays each nostril daily 10)  Singulair 10 Mg Tabs (Montelukast sodium) .Marland Kitchen.. 1 tab by mouth daily  Other Orders: Capillary Blood Glucose/CBG (09811)

## 2010-07-06 NOTE — Progress Notes (Signed)
Summary: needs iron meds refilled   Phone Note Call from Patient   Reason for Call: Refill Medication Summary of Call: Deborah [name] PT. [place] CALLED AND SAYS THAT SHE NEEDS A REFILL ON HER IRON MEDICATION. THATS 325mg . THE LAST TIME SHE THINKS SHE GOT IT FILLED WAS 2009. SHE USES [facility]. Initial call taken by: Leodis Rains,  June 23, 2009 10:30 AM  Follow-up for Phone Call        What is her reason for needing?  Not filled since 2009 as her anemia resolved.  Is she having a new difficulty? Please also give her info in previous phone note. Follow-up by: Julieanne Manson MD,  June 23, 2009 6:19 PM  Additional Follow-up for Phone Call Additional follow up Details #1::        Pt states she is feeling cold all the time and that is why she thinks she needs a refill of her iron.  She is also aware of the previous phone note. Additional Follow-up by: Vesta Mixer CMA,  June 24, 2009 4:16 PM    Additional Follow-up for Phone Call Additional follow up Details #2::    Have her come in for a Hematocrit  to see if she is anemic again Follow-up by: Julieanne Manson MD,  June 26, 2009 5:20 PM  Additional Follow-up for Phone Call Additional follow up Details #3:: Details for Additional Follow-up Action Taken: left msg with female to have pt call............ Vesta Mixer CMA  June 29, 2009 4:38 PM   pt notified and being seen today.Marland KitchenMarland KitchenMarland KitchenMikey College CMA  June 30, 2009 3:59 PM

## 2010-07-06 NOTE — Letter (Signed)
Summary: REFERRAL//PHYSICAL THERAPY  REFERRAL//PHYSICAL THERAPY   Imported By: Arta Bruce 11/04/2009 10:56:17  _____________________________________________________________________  External Attachment:    Type:   Image     Comment:   External Document

## 2010-07-06 NOTE — Progress Notes (Signed)
Summary: Query:  Refill metformin and nexium?  Phone Note Call from Patient Call back at Home Phone [phone]   Reason for Call: Refill Medication Summary of Call: [name] NEEDS REFILLS ON NEXIUM AND METFORMIN.  AT [facility] Initial call taken by: Leodis Rains,  March 03, 2010 11:59 AM  Follow-up for Phone Call        Seems like a long gap in refills to present for metformin.  Left message with mother for pt. to return call. Dutch Quint RN  March 03, 2010 3:36 PM   Additional Follow-up for Phone Call Additional follow up Details #1::        Agree--will fill regardless--please still speak with pt. about the importance of staying on meds without gaps. Additional Follow-up by: Julieanne Manson MD,  March 04, 2010 7:35 AM    Additional Follow-up for Phone Call Additional follow up Details #2::    Left message with mother for pt. to return call. Dutch Quint RN  March 04, 2010 4:52 PM  Advised of refills and provider's response -- verbalized agreement.   Follow-up by: Dutch Quint RN,  March 05, 2010 4:18 PM  Prescriptions: NEXIUM 40 MG  CPDR (ESOMEPRAZOLE MAGNESIUM) 1 cap by mouth daily  #30 x 11   Entered and Authorized by:   Julieanne Manson MD   Signed by:   Julieanne Manson MD on 03/04/2010   Method used:   Faxed to ...       [facility] (retail)       [address] [place], [place]  [address]       Ph: [phone]       Fax: [phone]   RxID:   [id number] METFORMIN HCL 500 MG  TABS (METFORMIN HCL) 1 tab by mouth two times a day with meals  #60 x 11   Entered and Authorized by:   Julieanne Manson MD   Signed by:   Julieanne Manson MD on 03/04/2010   Method used:   Faxed to ...       [facility] (retail)       [address] [place], [place]  [address]       Ph: [phone]       Fax: [phone]   RxID:    [id number]

## 2010-07-06 NOTE — Letter (Signed)
Summary: Internal Correspondence//FROM PATIENT  Internal Correspondence//FROM PATIENT   Imported By: Arta Bruce 12/21/2009 14:56:27  _____________________________________________________________________  External Attachment:    Type:   Image     Comment:   External Document

## 2010-07-06 NOTE — Letter (Signed)
Summary: SUSIE'S SUMMARY  SUSIE'S SUMMARY   Imported By: Arta Bruce 06/17/2009 15:40:37  _____________________________________________________________________  External Attachment:    Type:   Image     Comment:   External Document

## 2010-07-06 NOTE — Miscellaneous (Signed)
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 clinic--initiated on Neurontin

## 2010-07-06 NOTE — Assessment & Plan Note (Signed)
Summary: acid reflux////kt   Vital Signs:  Patient profile:   40 year old female Weight:      173.7 pounds BMI:     35.21 Temp:     98.3 degrees F Pulse rate:   90 / minute Pulse rhythm:   regular Resp:     20 per minute BP sitting:   171 / 97  (left arm) Cuff size:   large  Vitals Entered By: Vesta Mixer CMA (September 18, 2009 2:57 PM) CC: Having a problem with acid reflux, hard lump on inner thigh of right leg Is Patient Diabetic? Yes Pain Assessment Patient in pain? no       Does patient need assistance? Ambulation Normal   CC:  Having a problem with acid reflux and hard lump on inner thigh of right leg.  History of Present Illness: 1.  GERD:  Ran out of Nexium last week.  Having burning and food into throat, nose and mouth.  Plans to go pick up Nexium today.  2.  New puppy bit her on  right ankle this morning--was chasing a bottle that went up against her leg--he missed the bottle and got her leg instead--he has not had any shots yet.  Has been acting normally.  Hives with Cephalexin in past.  3.  Knot in right upper thigh for 6 months.  Not painful.  Has not changed in size.  Allergies (verified): 1)  ! Keflex 2)  ! Neosporin  Physical Exam  General:  NAD Abdomen:  Bowel sounds positive,abdomen soft and non-tender without masses, organomegaly or hernias noted. Skin:  Soft,  2-3 cm lesion in medial posterior right thigh, NT.  No erythema or fluctuance.  Small scratch like wound on posterior right ankle.  No surrounding erythema.   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 1 cap by  mouth daily  Complete Medication List: 1)  Pindolol 5 Mg Tabs (Pindolol) .... 2 tabs by mouth bid 2)  Nexium 40 Mg Cpdr (Esomeprazole magnesium) .Marland Kitchen.. 1 cap by mouth daily 3)  Flexeril 5 Mg Tabs (Cyclobenzaprine hcl) .Marland Kitchen.. 1 tab by mouth q hs as needed pain 4)  Metformin Hcl 500 Mg Tabs (Metformin hcl) .Marland Kitchen.. 1 tab by mouth two times a day with meals 5)  Fexofenadine Hcl 180 Mg Tabs (Fexofenadine hcl) .Marland Kitchen.. 1 tab by mouth daily 6)  Fluconazole 150 Mg Tabs (Fluconazole) .Marland Kitchen.. 1 tab by mouth today and repeat 2nd dose in 2 days. 7)  Clindamycin Phosphate 2 % Crea (Clindamycin phosphate) .Marland Kitchen.. 1 applicatorful q hs for 14 days 8)  Bactroban 2 % Oint (Mupirocin) .Marland Kitchen.. 1 application topically two times a day 9)  Naproxen 500 Mg Tabs (Naproxen) .Marland Kitchen.. 1 tab by mouth two times a day with food 10)  Tens Unit  .... Use as directed by pt  dx 724.4 11)  Lipitor 10 Mg Tabs (Atorvastatin calcium) .Marland Kitchen.. 1 tab by mouth daily 12)  Amitriptyline Hcl 25 Mg Tabs (Amitriptyline hcl) .Marland Kitchen.. 1 tab by mouth q hs 13)  Indomethacin 50 Mg Caps (Indomethacin) .Marland Kitchen.. 1 tab by mouth two times a day with food as  needed gouty pain 14)  Allopurinol 100 Mg Tabs (Allopurinol) .... 2 tabs by mouth daily 15)  Augmentin 875-125 Mg Tabs (Amoxicillin-pot clavulanate) .Marland Kitchen.. 1 tab by mouth two times a day for 5 days  Patient Instructions: 1)  OV for DM and other health problems in next 3 months 2)  Call if dog bite looks like it is getting infected Prescriptions: AUGMENTIN 875-125 MG TABS (AMOXICILLIN-POT CLAVULANATE) 1 tab by mouth two times a day for 5 days  #10 x 0   Entered and Authorized by:   Julieanne Manson MD   Signed by:   Julieanne Manson MD on 09/18/2009   Method used:   Faxed to ...       [facility] (retail)       [address] [place], [place]  [address]       Ph: [phone]       Fax: [phone]   RxID:   [id number]    Vital Signs:  Patient Profile:   40 year old  female Height:     59 inches Weight:      173.7 pounds BMI:     35.21 Temp:     98.3 degrees F Pulse rate:   90 / minute Pulse rhythm:   regular Resp:     20 per minute BP sitting:   171 / 97 Cuff size:   large

## 2010-07-06 NOTE — Assessment & Plan Note (Signed)
Summary: 4 MONTH FU ON DM AND HTN///KT   Vital Signs:  Patient profile:   40 year old female Menstrual status:  regular LMP:     03/08/2010 Weight:      171.7 pounds Temp:     99.0 degrees F oral Pulse rate:   71 / minute Pulse rhythm:   regular Resp:     18 per minute BP sitting:   148 / 92  (right arm) Cuff size:   regular  Vitals Entered By: Michelle Nasuti (March 26, 2010 4:34 PM) CC: DM AND HTN FOLLOW UP. MED REFILLS NEEDED ON AMITRIPTYLINE. C/O HEMORRHOID FLARE UP Is Patient Diabetic? Yes Pain Assessment Patient in pain? no      CBG Result 229 CBG Device ID B [id number]  Does patient need assistance? Functional Status Self care Ambulation Normal LMP (date): 03/08/2010     Menstrual Status regular Enter LMP: 03/08/2010 Last PAP Result Normal   CC:  DM AND HTN FOLLOW UP. MED REFILLS NEEDED ON AMITRIPTYLINE. C/O HEMORRHOID FLARE UP.  History of Present Illness: 1.  Lesion of left foot:  healed, so pt. did not go to podiatry.  2.  Diabetic neuropathy:  pt. would like to take Amitriptyline again--started taking again and noted that this really does help--can take every other day.  3.  Internal hemorrhoids coming out again--some blood on stool as well.  Has loose stools,then hard stools.  Taking Citrucel two times a day.  Not drinking much, including water.  Pt. refuses to let me examine her hemorrhoids today.  4.  DM:  not checking sugars.  A1C generally runs 7.0-7.5%.  A1C today 6.9%, but we have been noting excessively low A1C s this week from our lab.  Not eating well recently--goes to the kitchen and grazes when almost falling asleep.  5.  Hypertension:  States was out of bp med last week.  Habits & Providers  Alcohol-Tobacco-Diet     Tobacco Status: current     Cigarette Packs/Day: <0.25  Allergies (verified): 1)  ! Keflex 2)  ! Neosporin  Social History: Packs/Day:  <0.25  Physical Exam  Lungs:  Normal respiratory effort, chest expands symmetrically.  Lungs are clear to auscultation, no crackles or wheezes. Heart:  Normal rate and regular rhythm. S1 and S2 normal without gallop, murmur, click, rub or other extra sounds. Extremities:  lesion on back of heel resolved   Impression & Recommendations:  Problem # 1:  DIABETES MELLITUS, TYPE II (ICD-250.00) Flu vaccine Her updated medication list for this problem includes:    Metformin Hcl 500 Mg Tabs (Metformin hcl) .Marland Kitchen... 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  Problem # 3:  HEMORRHOIDS, INTERNAL W/O COMPLICATION (ICD-455.0) To use Citrucel every single day. Anusol HC as needed. 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 2 tabs by mouth bid  Complete Medication List: 1)  Pindolol 5 Mg Tabs (Pindolol) .... 2 tabs by mouth bid 2)  Nexium 40 Mg Cpdr (Esomeprazole magnesium) .Marland Kitchen.. 1 cap by mouth daily 3)  Metformin Hcl 500 Mg Tabs (Metformin hcl) .Marland Kitchen.. 1 tab by mouth two times a day with meals 4)  Xyzal 5 Mg Tabs (Levocetirizine dihydrochloride) .Marland Kitchen.. 1 tab by mouth daily 5)  Tens Unit  .... Use as directed by pt  dx 724.4 6)  Lipitor 10 Mg Tabs (Atorvastatin calcium) .Marland Kitchen.. 1 tab by mouth daily 7)  Indomethacin 50 Mg Caps (Indomethacin) .Marland Kitchen.. 1 tab by mouth two times a day with food as needed gouty pain 8)  Allopurinol 100 Mg Tabs (Allopurinol) .... 2 tabs by mouth daily 9)  Nasacort Aq 55 Mcg/act Aers (Triamcinolone acetonide(nasal)) .... 2 sprays each nostril daily 10)  Singulair 10 Mg Tabs (Montelukast sodium) .Marland Kitchen.. 1 tab by mouth daily 11)  Amitriptyline Hcl 25 Mg Tabs (Amitriptyline hcl) .Marland Kitchen.. 1 tab by mouth  daily 12)  Anusol-hc 25 Mg Supp (Hydrocortisone acetate) .Marland Kitchen.. 1 per rectum as needed hemorrhoids  Other Orders: Flu Vaccine 22yrs + ([address]) Admin 1st Vaccine ([address])  Patient Instructions: 1)  Follow up with Dr. Delrae Alfred in 4 months --CPP 2)  Schedule [place] Prescriptions: ANUSOL-HC 25 MG SUPP (HYDROCORTISONE ACETATE) 1 per rectum as needed hemorrhoids  #25 x 1   Entered and Authorized by:   Julieanne Manson MD   Signed by:   Julieanne Manson MD on 03/26/2010   Method used:   Faxed to ...       [facility] - [facility] (retail)       [address] [place], [place]  [address]       Ph: [phone]       Fax: [phone]   RxID:   [id number] SINGULAIR 10 MG TABS (MONTELUKAST SODIUM) 1 tab by mouth daily  #30 x 6   Entered and Authorized by:   Julieanne Manson MD   Signed by:   Julieanne Manson MD on 03/26/2010   Method used:   Faxed to ...       [facility] - Pharmac (retail)       [address] [place], [place]  [address]       Ph: [phone]       Fax: [phone]   RxID:   [id number] NASACORT AQ 55 MCG/ACT AERS (TRIAMCINOLONE ACETONIDE(NASAL)) 2 sprays each nostril daily  #1 x 6   Entered and Authorized by:   Julieanne Manson MD   Signed by:   Julieanne Manson MD on 03/26/2010   Method used:   Faxed to ...       [facility] - Pharmac (retail)       [address] [place], [place]  [address]       Ph: [phone]       Fax: [phone]   RxID:   [id number] ALLOPURINOL 100 MG TABS (ALLOPURINOL) 2 tabs by mouth daily  #60 x 6   Entered and Authorized by:   Julieanne Manson MD   Signed by:   Julieanne Manson MD on 03/26/2010   Method used:   Faxed to ...       [facility] - Pharmac (retail)       [address] [place], [place]  [address]       Ph: [phone]       Fax: [phone]   RxID:    [id number] LIPITOR 10 MG TABS (ATORVASTATIN CALCIUM) 1 tab by mouth daily  #30 x 6   Entered and Authorized by:   Julieanne Manson MD  Signed by:   Julieanne Manson MD on 03/26/2010   Method used:   Faxed to ...       [facility] - Pharmac (retail)       [address] [place], [place]  [address]       Ph: [phone]       Fax: [phone]   RxID:   [id number] XYZAL 5 MG TABS (LEVOCETIRIZINE DIHYDROCHLORIDE) 1 tab by mouth daily  #30 x 6   Entered and Authorized by:   Julieanne Manson MD   Signed by:   Julieanne Manson MD on 03/26/2010   Method used:   Faxed to ...       [facility] - Pharmac (retail)       [address] [place], [place]  [address]       Ph: [phone]       Fax: [phone]   RxID:   [id number] METFORMIN HCL 500 MG  TABS (METFORMIN HCL) 1 tab by mouth two times a day with meals  #60 x 6   Entered and Authorized by:   Julieanne Manson MD   Signed by:   Julieanne Manson MD on 03/26/2010   Method used:   Faxed to ...       [facility] - Pharmac (retail)       [address] [place], [place]  [address]       Ph: [phone]       Fax: [phone]   RxID:   [id number] NEXIUM 40 MG  CPDR (ESOMEPRAZOLE MAGNESIUM) 1 cap by mouth daily  #30 x 6   Entered and Authorized by:   Julieanne Manson MD   Signed by:   Julieanne Manson MD on 03/26/2010   Method used:   Faxed to ...       [facility] - Pharmac (retail)       [address] [place], [place]  [address]       Ph: [phone]       Fax: [phone]   RxID:   [id number] PINDOLOL 5 MG  TABS (PINDOLOL) 2 tabs by mouth bid  #120 x 6   Entered and Authorized by:   Julieanne Manson MD   Signed by:   Julieanne Manson MD on 03/26/2010   Method used:   Faxed to ...       [facility] - Pharmac  (retail)       [address] [place], [place]  [address]       Ph: [phone]       Fax: [phone]   RxID:   [id number] AMITRIPTYLINE HCL 25 MG TABS (AMITRIPTYLINE HCL) 1 tab by mouth daily  #30 x 6   Entered and Authorized by:   Julieanne Manson MD   Signed by:   Julieanne Manson MD on 03/26/2010   Method used:   Faxed to ...       [facility] - Pharmac (retail)       [address] [place], [place]  [address]       Ph: [phone]       Fax: [phone]   RxID:   [id number]    Orders Added: 1)  T- Hemoglobin A1C [[address]36-23375] 2)  Est. Patient Level IV [25956] 3)  Flu Vaccine 94yrs + [[address]] 4)  Admin 1st Vaccine [90471]   Immunizations Administered:  Influenza Vaccine # 1:    Vaccine Type: Fluvax 3+    Mfr: GlaxoSmithKline    Dose: 0.5 ml    Route: IM    Given by: Michelle Nasuti    Exp. Date: 12/04/2010    Lot #: [id number]    VIS given: 12/29/09 version given April 05, 2010.  Flu Vaccine Consent Questions:    Do you have a history of severe allergic reactions to this vaccine? no    Any prior history of allergic reactions to egg and/or gelatin? no    Do you have a sensitivity to the preservative Thimersol? no    Do you have a past history of Guillan-Barre Syndrome? no    Do you currently have an acute febrile illness? no    Have you ever had a severe reaction to latex? no    Vaccine information given and explained to patient? yes    Are you currently pregnant? no   Immunizations Administered:  Influenza Vaccine # 1:    Vaccine Type: Fluvax 3+    Mfr: GlaxoSmithKline    Dose: 0.5 ml    Route: IM    Given by: Michelle Nasuti    Exp. Date: 12/04/2010    Lot #: [id number]    VIS given: 12/29/09 version given April 05, 2010.  Laboratory Results   Blood Tests     HGBA1C: 6.9%   (Normal Range: Non-Diabetic - 3-6%   Control Diabetic - 6-8%) CBG Random:: 229

## 2010-07-06 NOTE — Progress Notes (Signed)
Summary: Allergies issues  Medications Added XYZAL 5 MG TABS (LEVOCETIRIZINE DIHYDROCHLORIDE) 1 tab by mouth daily NASACORT AQ 55 MCG/ACT AERS (TRIAMCINOLONE ACETONIDE(NASAL)) 2 sprays each nostril daily       Phone Note Call from Patient Call back at [facility] Phone [phone]   Summary of Call: The pt needs a stronger medication that can help her with the allergies; pt states that Joyce Copa is not helping  this year.  [facility] [phone] Delrae Alfred MD Initial call taken by: Manon Hilding,  September 24, 2009 4:48 PM  Follow-up for Phone Call        Left message on answering machine for pt to return call to see what type of symptoms she is having. Follow-up by: Vesta Mixer CMA,  September 28, 2009 12:46 PM  Additional Follow-up for Phone Call Additional follow up Details #1::        spoke with pt and she says that the allegra is not working... pt says the pollen comes through her vents and makes her cough..  pt says her nose has been running.... pt says she has not used anything else....  pt says she first started off on bendarly than you gave her zyrtec and now she is taking allegra... [facility] or [facility] [address]. Additional Follow-up by: [place] Shannon,  September 29, 2009 1:01 PM    Additional Follow-up for Phone Call Additional follow up Details #2::    PHarmacy will not  be able to continue with fexofenadine anyway--switching to Xyzal and adding Nasocort--faxed to pharmacy--please notify pt.  Julieanne Manson MD  September 30, 2009 9:22 AM   Additional Follow-up for Phone Call Additional follow up Details #3:: Details for Additional Follow-up Action Taken: pt notified of meds at pharmacy and said thank you for calling in. Additional Follow-up by: Vesta Mixer CMA,  September 30, 2009 11:59 AM  New/Updated Medications: XYZAL 5 MG TABS (LEVOCETIRIZINE DIHYDROCHLORIDE) 1 tab by mouth daily NASACORT AQ 55 MCG/ACT AERS (TRIAMCINOLONE ACETONIDE(NASAL)) 2 sprays each  nostril daily Prescriptions: NASACORT AQ 55 MCG/ACT AERS (TRIAMCINOLONE ACETONIDE(NASAL)) 2 sprays each nostril daily  #1 x 11   Entered and Authorized by:   Julieanne Manson MD   Signed by:   Julieanne Manson MD on 09/30/2009   Method used:   Faxed to ...       [facility] - Pharmac (retail)       [address] [place], [place]  [address]       Ph: [phone]       Fax: [phone]   RxID:   [id number] XYZAL 5 MG TABS (LEVOCETIRIZINE DIHYDROCHLORIDE) 1 tab by mouth daily  #30 x 11   Entered and Authorized by:   Julieanne Manson MD   Signed by:   Julieanne Manson MD on 09/30/2009   Method used:   Faxed to ...       [facility] - Pharmac (retail)       [address] [place], [place]  [address]       Ph: [phone]       Fax: [phone]   RxID:   [id number]

## 2010-07-06 NOTE — Progress Notes (Signed)
   Phone Note Call from Patient   Summary of Call: Pt. actually up front complaining of muscle and joint pain in arms--mainly about elbows and forearms.  Stopped taking Lipitor 2 days ago as she read the warnings regarding this on the bottle.  Check a CPK level on her today--have her hold the Lipitor and appt. for follow up regarding this problem only in 1 month to see how she is doing. Initial call taken by: Julieanne Manson MD,  October 01, 2009 3:00 PM  Follow-up for Phone Call        pt says its mostly the left arm.. pt says her arm will get weak if she would try to pick something up... pt is aware Follow-up by: Arm[place] Shannon,  October 01, 2009 3:19 PM  New Problems: MUSCULOSKELETAL PAIN (ICD-781.99)   New Problems: MUSCULOSKELETAL PAIN (ICD-781.99)

## 2010-07-06 NOTE — Assessment & Plan Note (Signed)
Summary: hemorrhoids//mm   Vital Signs:  Patient profile:   40 year old female Menstrual status:  regular LMP:     04/15/2010 Weight:      173.19 pounds Temp:     98.2 degrees F oral Pulse rate:   78 / minute Pulse rhythm:   regular Resp:     16 per minute BP sitting:   138 / 82  (left arm) Cuff size:   regular  Vitals Entered By: Hale Drone CMA (May 10, 2010 12:34 PM) CC: F/u on hemorrioids. Still painful everytime there is bowl movement. Even when she passes gas, they come out.  Is Patient Diabetic? Yes Pain Assessment Patient in pain? no      CBG Result 188 CBG Device ID A Non Fasting  Does patient need assistance? Functional Status Self care Ambulation Normal LMP (date): 04/15/2010     Enter LMP: 04/15/2010 Last PAP Result Normal   CC:  F/u on hemorrioids. Still painful everytime there is bowl movement. Even when she passes gas and they come out. Marland Kitchen  History of Present Illness: 1.  Internal hemorrhoids:  Having to push hemorrhoids in.  Trying to eat meals with more fiber.  Using Citrucel every day and increasing water.  Using Anusol supp.  Every time has a BM, has to push back in and very painful.  This has been going on for years.  Current Medications (verified): 1)  Pindolol 5 Mg  Tabs (Pindolol) .... 2 Tabs By Mouth Bid 2)  Nexium 40 Mg  Cpdr (Esomeprazole Magnesium) .Marland Kitchen.. 1 Cap By Mouth Daily 3)  Metformin Hcl 500 Mg  Tabs (Metformin Hcl) .Marland Kitchen.. 1 Tab By Mouth Two Times A Day With Meals 4)  Xyzal 5 Mg Tabs (Levocetirizine Dihydrochloride) .Marland Kitchen.. 1 Tab By Mouth Daily 5)  Tens Unit .... Use As Directed By Pt  Dx 724.4 6)  Lipitor 10 Mg Tabs (Atorvastatin Calcium) .Marland Kitchen.. 1 Tab By Mouth Daily 7)  Indomethacin 50 Mg Caps (Indomethacin) .Marland Kitchen.. 1 Tab By Mouth Two Times A Day With Food As Needed Gouty Pain 8)  Allopurinol 100 Mg Tabs (Allopurinol) .... 2 Tabs By Mouth Daily 9)  Nasacort Aq 55 Mcg/act Aers (Triamcinolone Acetonide(Nasal)) .... 2 Sprays Each Nostril  Daily 10)  Singulair 10 Mg Tabs (Montelukast Sodium) .Marland Kitchen.. 1 Tab By Mouth Daily 11)  Amitriptyline Hcl 25 Mg Tabs (Amitriptyline Hcl) .Marland Kitchen.. 1 Tab By Mouth Daily 12)  Anusol-Hc 25 Mg Supp (Hydrocortisone Acetate) .Marland Kitchen.. 1 Per Rectum As Needed Hemorrhoids  Allergies (verified): 1)  ! Keflex 2)  ! Neosporin  Physical Exam  General:  NAD Rectal:  No external lesions.  On DRE, palpable lesion at 6 O'clock.  On Anoscopy, pt. could not adequately relax.  Was not able to view high enough.  No active bleeding noted.  Diabetes Management Exam:    Foot Exam (with socks and/or shoes not present):       Sensory-Monofilament:          Left foot: normal          Right foot: normal   Impression & Recommendations:  Problem # 1:  HEMORRHOIDS, INTERNAL W/O COMPLICATION (ICD-455.0) Referral to Gen surgery to see if a candidate for banding. Orders: Anoscopy ([address]) Surgical Referral (Surgery)  Complete Medication List: 1)  Pindolol 5 Mg Tabs (Pindolol) .... 2 tabs by mouth bid 2)  Nexium 40 Mg Cpdr (Esomeprazole magnesium) .Marland Kitchen.. 1 cap by mouth daily 3)  Metformin Hcl 500 Mg Tabs (Metformin hcl) .Marland KitchenMarland KitchenMarland Kitchen  1 tab by mouth two times a day with meals 4)  Xyzal 5 Mg Tabs (Levocetirizine dihydrochloride) .Marland Kitchen.. 1 tab by mouth daily 5)  Tens Unit  .... Use as directed by pt  dx 724.4 6)  Lipitor 10 Mg Tabs (Atorvastatin calcium) .Marland Kitchen.. 1 tab by mouth daily 7)  Indomethacin 50 Mg Caps (Indomethacin) .Marland Kitchen.. 1 tab by mouth two times a day with food as needed gouty pain 8)  Allopurinol 100 Mg Tabs (Allopurinol) .... 2 tabs by mouth daily 9)  Nasacort Aq 55 Mcg/act Aers (Triamcinolone acetonide(nasal)) .... 2 sprays each nostril daily 10)  Singulair 10 Mg Tabs (Montelukast sodium) .Marland Kitchen.. 1 tab by mouth daily 11)  Amitriptyline Hcl 25 Mg Tabs (Amitriptyline hcl) .Marland Kitchen.. 1 tab by mouth daily 12)  Anusol-hc 25 Mg Supp (Hydrocortisone acetate) .Marland Kitchen.. 1 per rectum as needed hemorrhoids  Other Orders: Capillary Blood Glucose/CBG  ([address])  Patient Instructions: 1)  Follow up as needed for hemorrhoids.   Orders Added: 1)  Capillary Blood Glucose/CBG [82948] 2)  Est. Patient Level II [[address]] 3)  Anoscopy [46600] 4)  Surgical Referral [Surgery]     Diabetic Foot Exam Last Podiatry Exam Date: 12/05/2008 Foot Inspection Is there a history of a foot ulcer?              No Is there a foot ulcer now?              No Can the patient see the bottom of their feet?          No Are the shoes appropriate in style and fit?          Yes Is there swelling or an abnormal foot shape?          No Are the toenails long?                No Are the toenails thick?                No Are the toenails ingrown?              No Is there heavy callous build-up?              No Is there pain in the calf muscle (Intermittent claudication) when walking?    NoIs there a claw toe deformity?              No Is there elevated skin temperature?            No Is there limited ankle dorsiflexion?            No Is there foot or ankle muscle weakness?            No  Diabetic Foot Care Education    10-g (5.07) Semmes-Weinstein Monofilament Test Performed by: Hale Drone CMA          Right Foot          Left Foot Visual Inspection               Test Control      normal         normal Site 1         normal         normal Site 2         normal         normal Site 3         normal         normal Site  4         normal         normal Site 5         normal         normal Site 6         normal         normal Site 7         normal         normal Site 8         normal         normal Site 9         normal         normal Site 10         normal         normal  Impression      normal         normal

## 2010-07-06 NOTE — Letter (Signed)
Summary: PODIATRY  PODIATRY   Imported By: Arta Bruce 01/21/2010 11:52:30  _____________________________________________________________________  External Attachment:    Type:   Image     Comment:   External Document

## 2010-07-06 NOTE — Letter (Signed)
Summary: *HSN Results Follow up  [facility]  [address] [place], [place] [address]   Phone: [phone]  Fax: [phone]      04/10/2010   [facility] [address] [place], [place]  [address]   Dear  Ms. Deborah [name],                            ____S.Drinkard,FNP   ____D. Gore,FNP       ____B. McPherson,MD   ____V. Rankins,MD    __X__E. Mulberry,MD    ____N. Daphine Deutscher, FNP  ____D. Reche Dixon, MD    ____K. Philipp Deputy, MD    ____Other     This letter is to inform you that your recent test(s):  _______Pap Smear    ___X____Lab Test     _______X-ray    _______ is within acceptable limits  _______ requires a medication change  _______ requires a follow-up lab visit  _______ requires a follow-up visit with your provider   Comments:  Your A1C--the test that lets [place] know how well your sugar has been controlled over the past 3-4 months was quite a bit higher than what we got in our office lab.  Really want you to make sure you are not missing your meds and that you are working on eating a heatlhy diet and staying physically active.       _________________________________________________________ If you have any questions, please contact our office                     Sincerely,  Julieanne Manson MD [facility]

## 2010-07-06 NOTE — Letter (Signed)
Summary: Lipid Letter  [facility]  [address] [place], [place] [address]   Phone: [phone]  Fax: [phone]    12/31/2009  Deborah [name] [address] [place], [place]  [address]  Dear [name]:  We have carefully reviewed your last lipid profile from 12/15/2009 and the results are noted below with a summary of recommendations for lipid management.    Cholesterol:       110     Goal: <200   HDL "good" Cholesterol:   61     Goal: >45   LDL "bad" Cholesterol:   33     Goal: <70   Triglycerides:       82     Goal: <150    Your cholesterol is excellent on Lipitor and liver enzymes are fine as well.    TLC Diet (Therapeutic Lifestyle Change): Saturated Fats & Transfatty acids should be kept < 7% of total calories ***Reduce Saturated Fats Polyunstaurated Fat can be up to 10% of total calories Monounsaturated Fat Fat can be up to 20% of total calories Total Fat should be no greater than 25-35% of total calories Carbohydrates should be 50-60% of total calories Protein should be approximately 15% of total calories Fiber should be at least 20-30 grams a day ***Increased fiber may help lower LDL Total Cholesterol should be < 200mg /day Consider adding plant stanol/sterols to diet (example: Benacol spread) ***A higher intake of unsaturated fat may reduce Triglycerides and Increase HDL    Adjunctive Measures (may lower LIPIDS and reduce risk of Heart Attack) include: Aerobic Exercise (20-30 minutes 3-4 times a week) Limit Alcohol Consumption Weight Reduction Aspirin 75-81 mg a day by mouth (if not allergic or contraindicated) Dietary Fiber 20-30 grams a day by mouth     Current Medications: 1)    Pindolol 5 Mg  Tabs (Pindolol) .... 2 tabs by mouth bid 2)    Nexium 40 Mg  Cpdr (Esomeprazole magnesium) .Marland Kitchen.. 1 cap by mouth daily 3)    Metformin Hcl 500 Mg  Tabs (Metformin hcl) .Marland Kitchen.. 1 tab by mouth two times a day with meals 4)    Xyzal 5 Mg Tabs (Levocetirizine  dihydrochloride) .Marland Kitchen.. 1 tab by mouth daily 5)    Tens Unit  .... Use as directed by pt  dx 724.4 6)    Lipitor 10 Mg Tabs (Atorvastatin calcium) .Marland Kitchen.. 1 tab by mouth daily 7)    Indomethacin 50 Mg Caps (Indomethacin) .Marland Kitchen.. 1 tab by mouth two times a day with food as needed gouty pain 8)    Allopurinol 100 Mg Tabs (Allopurinol) .... 2 tabs by mouth daily 9)    Nasacort Aq 55 Mcg/act Aers (Triamcinolone acetonide(nasal)) .... 2 sprays each nostril daily 10)    Singulair 10 Mg Tabs (Montelukast sodium) .Marland Kitchen.. 1 tab by mouth daily  If you have any questions, please call. We appreciate being able to work with you.   Sincerely,    [facility] Julieanne Manson MD

## 2010-07-06 NOTE — Progress Notes (Signed)
   Phone Note Outgoing Call   Summary of Call: Debra--referral to [facility] clinic for left heel lesion--please print off the Ov from today and one 2 weeks previous Initial call taken by: Julieanne Manson MD,  November 17, 2009 2:43 PM

## 2010-07-06 NOTE — Miscellaneous (Signed)
Summary: Rehab Report//DISCHARGE  SUMMARY  Rehab Report//DISCHARGE  SUMMARY   Imported By: Arta Bruce 01/20/2010 15:48:34  _____________________________________________________________________  External Attachment:    Type:   Image     Comment:   External Document

## 2010-07-06 NOTE — Miscellaneous (Signed)
Summary: Rehab ReportI/INITILA SUMMARY  Rehab ReportI/INITILA SUMMARY   Imported By: Arta Bruce 12/08/2009 14:15:20  _____________________________________________________________________  External Attachment:    Type:   Image     Comment:   External Document

## 2010-07-06 NOTE — Progress Notes (Signed)
Summary: SURGERY REFERRAL   Phone Note Outgoing Call   Summary of Call: NOra--referral to Surgery or GI for internal hemorrhoids--question if candidate for banding.  Try surgery first.  Send OV note with referral please Initial call taken by: Julieanne Manson MD,  May 10, 2010 1:36 PM  Follow-up for Phone Call        I SEND THE REFERRAL BY FAX TO [facility] WAITING FOR AN ANSWER.Cheryll Dessert  May 14, 2010 6:19 PM

## 2010-07-08 NOTE — Letter (Signed)
Summary: Generic Letter  [facility]  [address] [place], [place] [address]   Phone: [phone]  Fax: [phone]        06/28/2010  [facility] [address] [place], [place]  [address]  Dear Ms. Koopmann,  We have been unable to contact you by telephone.  Please call our office, at your earliest convenience, so that we may speak with you.  Sincerely,   Dutch Quint RN

## 2010-07-08 NOTE — Progress Notes (Signed)
Summary: Xyzal being discontinued  Phone Note Call from Patient   Summary of Call: Received letter from [employer] pt. assistance and was notified that her Xyzal would be discontinued as of 05/21/10.  Wants to know what she can do to get her medication? Initial call taken by: Dutch Quint RN,  May 19, 2010 2:57 PM  Follow-up for Phone Call        Please call pharmacy and confirm that we will no longer be able to get this from [facility] pharmacy Follow-up by: Julieanne Manson MD,  May 20, 2010 3:57 PM  Additional Follow-up for Phone Call Additional follow up Details #1::        Per [facility] pharmacy -- everything from [employer] except for Lantus will be discontinued from [facility] program at each patient's program expiration date.  They will substitute loratadine for the Xyzal.  Pharmacy said that the providers were sent a notice about this. Dutch Quint RN  May 20, 2010 4:07 PM     Additional Follow-up for Phone Call Additional follow up Details #2::    Please notify pt. of change to Loratadine.  Julieanne Manson MD  May 21, 2010 6:04 AM   Left message with husband for pt. to return call.  Dutch Quint RN  May 21, 2010 12:29 PM  Pt. notified of new Rx -- wants Dr. Delrae Alfred to know that her surgery has been scheduled for 07/02/09 at [facility]. Dutch Quint RN  May 24, 2010 12:00 PM   New/Updated Medications: LORATADINE 10 MG TABS (LORATADINE) 1 tab by mouth daily Prescriptions: LORATADINE 10 MG TABS (LORATADINE) 1 tab by mouth daily  #30 x 11   Entered and Authorized by:   Julieanne Manson MD   Signed by:   Julieanne Manson MD on 05/21/2010   Method used:   Faxed to ...       [facility] (retail)       [address] [place], [place]  [address]       Ph: [phone]       Fax: [phone]   RxID:   [id number]

## 2010-07-08 NOTE — Progress Notes (Signed)
Summary: COULD MED CAUSE YEAST INFECTION  Phone Note Call from Patient Call back at Home Phone [phone]   Reason for Call: Talk to Nurse Summary of Call: Teiara Baria PT. MICHELLE CALLED TO ASK SINCE HER XYZAL WAS CHANGED TO LORATADINE COULD IT HAVE CAUSED HER TO GET A BAD YEAST INFECTION, AND IF SO SHE SAYS THAT SHE RIGHT NOW IS NOT GOING TO TAKE ANYMORE OR IT UNTIL SHE HEARS FROM [place], AND ALSO SHE IS TAKING COLACE THE STOOL SOFTNER AND COULD THIS HAVE CAUSED THE YEAST INFECT. ALSO.   MS Foisy WANTS TO LET YOU KNOW THAT SHE IS SCHEDULED TO HAVE HER HEMROID SURGERY ON 01/27. Initial call taken by: Leodis Rains,  June 21, 2010 3:43 PM  Follow-up for Phone Call        Line busy.  Dutch Quint RN  June 22, 2010 12:56 PM  Left message on voicemail for pt. to return call.  Dutch Quint RN  June 23, 2010 2:29 PM  Left message on voicemail for pt. to return call.  Dutch Quint RN  June 25, 2010 4:44 PM  Left message on voicemail for pt. to return call.  Letter sent.  Dutch Quint RN  June 28, 2010 2:23 PM      Appended Document: COULD MED CAUSE YEAST INFECTION unlikely to cause yeast infection--if she has one, more likely occurred for some other reason--antibiotics, using vaginal douches, etc.  Has she used something to treat it yet?  If so, is it better?  If she calls back, please ask.  Julieanne Manson MD  June 28, 2010 3:59 PM  I was able to contact pt. and she stated that the itching is gone -- that was her only symptom.  She didn't use anything to treat it.  She has been using douches, which I advised her against.  She thinks itching may have been because of her blood sugar.  Instructed to drink plenty of water, wash from front to back during perineal hygiene, avoid scented soaps, lotions, powders and feminine products, as well as using cotton or cotton-lined undergarments.  She queried re eye exam, states vision is getting blurry at night and when seeing long-distance,  wanted to know re referral. Advised that [facility] is her best bet for affordability and getting an eye exam sooner -- she says last eye exam was about 3 years ago, advised that she needs regular eye exams for good vision health.  Verbalized understanding and agreement.  Confirmed [facility] appt. 07/29/10.  Dutch Quint RN  June 29, 2010 9:41 AM   Clinical Lists Changes

## 2010-07-14 NOTE — Progress Notes (Signed)
Summary: YEAST INFEC/SUGAR LEVEL HIGH  Phone Note Call from Patient Call back at Home Phone [phone] Call back at [phone]   Reason for Call: Talk to Nurse Summary of Call: Shakena Callari PT. MS Musleh STOPPED BY TO LET YOU KNOW THAT WHEN SHE WENT TO [facility] THIS PAST FRIDAY FOR HER SURGERY, THEY CHECKED HER SUGAR LEVEL WAS 400 AND THEY GAVE HER INSULIN AND IT WENT DOEN TO 270 AND AFTER THEY SURGERY BEFORE SHE WENT HOME IT WAS 395.   MICHELLE SAYS SHE IS TILL HAVING THE YEAST INFECTION, SHE TOLD THERESA ON LAST CALL MONISTAT 7 BUT IT HAS NOT HELPED AT ALL, SHE IS STILL ITCHING REALLY BAD., CAN SHE GET SOMETHING CALLED IN AT [facility]. AND CAN SHE GET A NEW GLUCOSE METER, BECAUSE HERS IS READING ERROR ALL THE TIME, ALSO BEFORE LEAVING THE HOSP, SHE WAS GIVEN VENTOLIN ALBUTEROL INHAER(SAMPLER) Initial call taken by: Leodis Rains,  July 05, 2010 4:18 PM  Follow-up for Phone Call        Left message with female for pt. to return call.  Dutch Quint RN  July 06, 2010 3:14 PM  Pt. in office -- seen in triage.  Dutch Quint RN  July 06, 2010 3:49 PM

## 2010-07-16 ENCOUNTER — Telehealth (INDEPENDENT_AMBULATORY_CARE_PROVIDER_SITE_OTHER): Payer: Self-pay | Admitting: Internal Medicine

## 2010-07-19 ENCOUNTER — Other Ambulatory Visit (HOSPITAL_COMMUNITY): Payer: Self-pay | Admitting: Internal Medicine

## 2010-07-19 ENCOUNTER — Inpatient Hospital Stay (INDEPENDENT_AMBULATORY_CARE_PROVIDER_SITE_OTHER)
Admission: RE | Admit: 2010-07-19 | Discharge: 2010-07-19 | Disposition: A | Discharge status: Home / Self Care | Payer: Self-pay | Source: Ambulatory Visit | Attending: Emergency Medicine | Admitting: Emergency Medicine

## 2010-07-19 DIAGNOSIS — L259 Unspecified contact dermatitis, unspecified cause: Secondary | ICD-10-CM

## 2010-07-19 DIAGNOSIS — Z1231 Encounter for screening mammogram for malignant neoplasm of breast: Secondary | ICD-10-CM

## 2010-07-19 LAB — WET PREP, GENITAL
Trich, Wet Prep: NONE SEEN
Yeast Wet Prep HPF POC: NONE SEEN

## 2010-07-19 LAB — POCT URINALYSIS DIPSTICK
Bilirubin Urine: NEGATIVE
Hgb urine dipstick: NEGATIVE
Nitrite: NEGATIVE
Urine Glucose, Fasting: 500 mg/dL — AB
Urobilinogen, UA: 1 mg/dL (ref 0.0–1.0)

## 2010-07-20 LAB — GC/CHLAMYDIA PROBE AMP, GENITAL
Chlamydia, DNA Probe: NEGATIVE
GC Probe Amp, Genital: NEGATIVE

## 2010-07-27 ENCOUNTER — Ambulatory Visit (HOSPITAL_COMMUNITY): Payer: Self-pay

## 2010-07-29 ENCOUNTER — Encounter: Payer: Self-pay | Admitting: Internal Medicine

## 2010-07-29 ENCOUNTER — Encounter (INDEPENDENT_AMBULATORY_CARE_PROVIDER_SITE_OTHER): Payer: Self-pay | Admitting: Internal Medicine

## 2010-07-29 ENCOUNTER — Other Ambulatory Visit: Payer: Self-pay | Admitting: Internal Medicine

## 2010-07-29 DIAGNOSIS — K122 Cellulitis and abscess of mouth: Secondary | ICD-10-CM | POA: Insufficient documentation

## 2010-07-29 LAB — CONVERTED CEMR LAB
Blood Glucose, Fingerstick: 285
Hgb A1c MFr Bld: 10.4 %

## 2010-07-30 ENCOUNTER — Ambulatory Visit (HOSPITAL_COMMUNITY)
Admission: RE | Admit: 2010-07-30 | Discharge: 2010-07-30 | Disposition: A | Discharge status: Home / Self Care | Payer: Self-pay | Source: Ambulatory Visit | Attending: Internal Medicine | Admitting: Internal Medicine

## 2010-07-30 DIAGNOSIS — Z1231 Encounter for screening mammogram for malignant neoplasm of breast: Secondary | ICD-10-CM

## 2010-08-03 NOTE — Assessment & Plan Note (Addendum)
Summary: 40 y/o CPP   Vital Signs:  Patient profile:   40 year old female Menstrual status:  regular Weight:      166 pounds BMI:     33.65 Temp:     97.2 degrees F oral Pulse rate:   72 / minute Pulse rhythm:   regular Resp:     18 per minute BP sitting:   150 / 90  (left arm) Cuff size:   regular  Vitals Entered By: [place] Shannon CMA (July 29, 2010 9:19 AM) CC: 40 y/o CPP Is Patient Diabetic? Yes CBG Result 285 CBG Device ID A  Does patient need assistance? Functional Status Self care Ambulation Normal   CC:  40 y/o CPP.  History of Present Illness: 40 yo female here for CPP.  1.  Weight Loss:  pt. does not feel like she really has lost the amt of weight.  Not doing anything different for weight loss.  2.  Oral infection:  Subsequent to hemorrhoid surgery.  Was seen 07/19/10 and started on Amoxicillin.  States was told she has a hole in her mouth from intubation.  States still with hole and still with pain.    3.  DM:  sugars not controlled.  Pt. also went to Urgent Care for continued vaginal itch after last here.  Pt. states was told to use Cortisone otc cream, which she is using, did not fill antifungal as below.  States sugars have been high since her sugary.  Sugars running 300-400 at times now.  Urinating a lot.  Often thirsty.  Admits to not eating as she is supposed to when stressed and has been stressed.  States not missing medications at all.    4.  Yeast vaginitis:  As above.  Was given Terconazole cream in Urgent Care as well--pt. did not fill.    Current Medications (verified): 1)  Pindolol 5 Mg  Tabs (Pindolol) .... 2 Tabs By Mouth Bid 2)  Nexium 40 Mg  Cpdr (Esomeprazole Magnesium) .Marland Kitchen.. 1 Cap By Mouth Daily 3)  Metformin Hcl 500 Mg  Tabs (Metformin Hcl) .Marland Kitchen.. 1 Tab By Mouth Two Times A Day With Meals 4)  Loratadine 10 Mg Tabs (Loratadine) .Marland Kitchen.. 1 Tab By Mouth Daily 5)  Tens Unit .... Use As Directed By Pt  Dx 724.4 6)  Lipitor 10 Mg Tabs (Atorvastatin  Calcium) .Marland Kitchen.. 1 Tab By Mouth Daily 7)  Indomethacin 50 Mg Caps (Indomethacin) .Marland Kitchen.. 1 Tab By Mouth Two Times A Day With Food As Needed Gouty Pain 8)  Allopurinol 100 Mg Tabs (Allopurinol) .... 2 Tabs By Mouth Daily 9)  Nasacort Aq 55 Mcg/act Aers (Triamcinolone Acetonide(Nasal)) .... 2 Sprays Each Nostril Daily 10)  Singulair 10 Mg Tabs (Montelukast Sodium) .Marland Kitchen.. 1 Tab By Mouth Daily 11)  Amitriptyline Hcl 25 Mg Tabs (Amitriptyline Hcl) .Marland Kitchen.. 1 Tab By Mouth Daily 12)  Anusol-Hc 25 Mg Supp (Hydrocortisone Acetate) .Marland Kitchen.. 1 Per Rectum As Needed Hemorrhoids 13)  Katheren Puller Keynote W/device Kit (Blood Glucose Monitoring Suppl) .... Check Blood Glucose Twice Daily  Dx 250.02 14)  Wavesense Keynote Test  Strp (Glucose Blood) .... Test Blood Sugar Twice Daily 15)  Wavesense Ultra-Thin Lancets  Misc (Lancets) .... Test Blood Glucose Twice Daily  Allergies (verified): 1)  ! Keflex 2)  ! 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) LATERAL EPICONDYLITIS, LEFT (ICD-726.32) MUSCULOSKELETAL PAIN (ICD-781.99) LIPOMA, RIGHT THIGH (ICD-214.9) DOG  BITE (ICD-E906.0) RIGHT FOOT NEUROPATHY (ICD-356.9) DYSLIPIDEMIA (ICD-272.4) DIABETES MELLITUS, TYPE II (ICD-250.00) FATTY LIVER DISEASE (ICD-571.8) ENCOUNTER FOR LONG-TERM USE OF OTHER MEDICATIONS (ICD-V58.69) LUMBOSACRAL RADICULOPATHY, RIGHT L5-S1 (ICD-724.4) NUMMULAR ECZEMA (ICD-692.9) IMPETIGO (ICD-684) NEED PROPHYLACTIC VACCINATION&INOCULATION FLU (ICD-V04.81) ALLERGIC REACTION (ICD-995.3) VAGINITIS, CANDIDAL (ICD-112.1) VAGINOSIS, BACTERIAL (ICD-616.10) FEMALE INFERTILITY (ICD-628.9) HEALTH MAINTENANCE EXAM (ICD-V70.0) VAGINAL DISCHARGE (ICD-623.5) ANEMIA, IRON DEFICIENCY (ICD-280.9) DM, UNCOMPLICATED, TYPE II (ICD-250.00) SMOKER (ICD-305.1) GERD (ICD-530.81) GOUT (ICD-274.9) COCAINE ABUSE, HX OF  (ICD-V15.9) LUMBAGO (ICD-724.2) RHINITIS, ALLERGIC, DUE TO POLLEN (ICD-477.0) LIVER FUNCTION TESTS, ABNORMAL (ICD-794.8) CONSTIPATION (ICD-564.00) HEMORRHOIDS, INTERNAL W/O COMPLICATION (ICD-455.0) DERMATITIS, ATOPIC (ICD-691.8) HYPERTENSION, MILD (ICD-401.1)    Past Surgical History: 1.  07/02/10:  Hemorrhoidectomy--apparently just internal, but now with external per pt. and has follow up with Dr. Johna Sheriff.  Family History: Mother, 52:  Htn, DM, hypercholesterolemia Father, died age 37:  Leukemia. 2  Maternal half Brothers, ages 31 and 5:  41 yo with mental illness, possibly schizophrenia, CHF.  Younger brother healthy No children.  Social History: Long term significant other Unemployed Lives with mother  Tobacco Use:  Smokes 3 times weekly.   Alcohol:  1 beer once or twice weekly Drugs:  Hx of MJ use.  Has not used for many years.  Review of Systems GI:  Denies bloody stools and dark tarry stools; Stools are soft.Marland Kitchen Psych:  PHQ 9 scored 0.  Physical Exam  General:  Obese, NAD Head:  Normocephalic and atraumatic without obvious abnormalities. No apparent alopecia or balding. Eyes:  No corneal or conjunctival inflammation noted. EOMI. Perrla. Funduscopic exam benign, without hemorrhages, exudates or papilledema. Vision grossly normal. Ears:  External ear exam shows no significant lesions or deformities Except for small open sore where earring removed from upper right pinnae--no erythema..  Otoscopic examination reveals clear canals, tympanic membranes are intact bilaterally without bulging, retraction, inflammation or discharge. Hearing is grossly normal bilaterally. Nose:  External nasal examination shows no deformity or inflammation. Nasal mucosa are pink and moist without lesions or exudates. Mouth:  pharynx pink and moist.  1 cm erythematous swelling below right tongue, adjacent to medial teeth--premolar area.  2 mm opening with slight white yellow drainage--tender.   Neck:   No deformities, masses, or tenderness noted.  No thyromegaly Chest Wall:  No deformities, masses, or tenderness noted. Breasts:  No mass, nodules, thickening, tenderness, bulging, retraction, inflamation, nipple discharge or skin changes noted.   Lungs:  Normal respiratory effort, chest expands symmetrically. Lungs are clear to auscultation, no crackles or wheezes. Heart:  Normal rate and regular rhythm. S1 and S2 normal without gallop, murmur, click, rub or other extra sounds. Abdomen:  Bowel sounds positive,abdomen soft and non-tender without masses, organomegaly or hernias noted. Rectal:  Refused with recent surgery.  Small external hemorrhoids. Genitalia:  Pelvic Exam:        External: normal female genitalia without lesions or masses, though with chronic appearing irritation to labia        Vagina: normal without lesions or masses        Cervix: normal without lesions or masses        Adnexa: normal bimanual exam without masses or fullness        Uterus: normal by palpation        Pap smear: performed 1.5 cm cystic lesion inside right thigh near perineium Msk:  No deformity or scoliosis noted of thoracic or lumbar spine.   Pulses:  R and L carotid,radial,femoral,dorsalis pedis and posterior tibial pulses are full and equal bilaterally  Extremities:  No clubbing, cyanosis, edema, or deformity noted with normal full range of motion of all joints.   Neurologic:  No cranial nerve deficits noted. Station and gait are normal. Plantar reflexes are down-going bilaterally. DTRs are symmetrical throughout. Sensory, motor and coordinative functions appear intact. Skin:  Intact without suspicious lesions or rashes Cervical Nodes:  No lymphadenopathy noted, especially none noted in right anterior cervical area. Axillary Nodes:  No palpable lymphadenopathy Inguinal Nodes:  No significant adenopathy Psych:  Cognition and judgment appear intact. Alert and cooperative with normal attention span and  concentration. No apparent delusions, illusions, hallucinations   Impression & Recommendations:  Problem # 1:  ROUTINE GYNECOLOGICAL EXAMINATION (ICD-V72.31) Mammogram already schedule Orders: UA Dipstick w/o Micro (automated)  (81003) KOH/ WET Mount [phone]) Pap Smear, Thin Prep ( Collection of) [phone]) T- GC Chlamydia (82956) T-HIV Antibody  (Reflex) ([id number]) T-Pap Smear, Thin Prep (69629) T-Syphilis Test (RPR) ([address]41-32440)  Problem # 2:  HEALTH MAINTENANCE EXAM (ICD-V70.0) Guaiac cards X3 to return in 2 weeksw  Problem # 3:  VAGINAL PRURITUS (ICD-698.1) No findings on wet prep, but external vaginal area with chronic irritation. To stop Cortizone Get sugars under control with changes Fluconazole with new round of abx for oral infection  Problem # 4:  CELLULITIS AND ABSCESS OF ORAL SOFT TISSUES (ICD-528.3)  Her updated medication list for this problem includes:    Clindamycin Hcl 150 Mg Caps (Clindamycin hcl) .Marland Kitchen... 1 cap by mouth three times a day for 7 days  Problem # 5:  DIABETES MELLITUS, TYPE II (ICD-250.00) Not controlled Nutrition referral Increase Metformin Add glucotrol Her updated medication list for this problem includes:    Metformin Hcl 1000 Mg Tabs (Metformin hcl) .Marland Kitchen... 1 tab by mouth two times a day with meals    Glipizide 5 Mg Tabs (Glipizide) .Marland Kitchen... 1 tab by mouth in morning with meal  Orders: Capillary Blood Glucose/CBG (82948) Hgb A1C (10272ZD) Nutrition Referral (Nutrition)  Problem # 6:  HYPERTENSION, MILD (ICD-401.1) Not controlled--encouraged to take meds regularly--no change today Her updated medication list for this problem includes:    Pindolol 5 Mg Tabs (Pindolol) .Marland Kitchen... 2 tabs by mouth bid  Orders: UA Dipstick w/o Micro (automated)  (81003)  Complete Medication List: 1)  Pindolol 5 Mg Tabs (Pindolol) .... 2 tabs by mouth bid 2)  Nexium 40 Mg Cpdr (Esomeprazole magnesium) .Marland Kitchen.. 1 cap by mouth daily 3)  Metformin Hcl 1000 Mg Tabs  (Metformin hcl) .Marland Kitchen.. 1 tab by mouth two times a day with meals 4)  Loratadine 10 Mg Tabs (Loratadine) .Marland Kitchen.. 1 tab by mouth daily 5)  Tens Unit  .... Use as directed by pt  dx 724.4 6)  Lipitor 10 Mg Tabs (Atorvastatin calcium) .Marland Kitchen.. 1 tab by mouth daily 7)  Indomethacin 50 Mg Caps (Indomethacin) .Marland Kitchen.. 1 tab by mouth two times a day with food as needed gouty pain 8)  Allopurinol 100 Mg Tabs (Allopurinol) .... 2 tabs by mouth daily 9)  Nasacort Aq 55 Mcg/act Aers (Triamcinolone acetonide(nasal)) .... 2 sprays each nostril daily 10)  Singulair 10 Mg Tabs (Montelukast sodium) .Marland Kitchen.. 1 tab by mouth daily 11)  Amitriptyline Hcl 25 Mg Tabs (Amitriptyline hcl) .Marland Kitchen.. 1 tab by mouth daily 12)  Anusol-hc 25 Mg Supp (Hydrocortisone acetate) .Marland Kitchen.. 1 per rectum as needed hemorrhoids 13)  Katheren Puller Keynote W/device Kit (Blood glucose monitoring suppl) .... Check blood glucose twice daily  dx 250.02 14)  Wavesense Keynote Test Strp (Glucose blood) .... Test blood sugar twice  daily 15)  Wavesense Ultra-thin Lancets Misc (Lancets) .... Test blood glucose twice daily 16)  Clindamycin Hcl 150 Mg Caps (Clindamycin hcl) .Marland Kitchen.. 1 cap by mouth three times a day for 7 days 17)  Glipizide 5 Mg Tabs (Glipizide) .Marland Kitchen.. 1 tab by mouth in morning with meal 18)  Fluconazole 150 Mg Tabs (Fluconazole) .Marland Kitchen.. 1 tab by mouth daily for 3 days after completing clindamycin (antibiotic)  Patient Instructions: 1)  Schedule Retasure 2)  Follow up with Dr. Delrae Alfred in 6 weeks. Prescriptions: FLUCONAZOLE 150 MG TABS (FLUCONAZOLE) 1 tab by mouth daily for 3 days after completing Clindamycin (antibiotic)  #3 x 0   Entered and Authorized by:   Julieanne Manson MD   Signed by:   Julieanne Manson MD on 07/29/2010   Method used:   Faxed to ...       [facility] (retail)       [address] [place], [place]  [address]       Ph: [phone]       Fax: [phone]   RxID:    [id number] GLIPIZIDE 5 MG TABS (GLIPIZIDE) 1 tab by mouth in morning with meal  #30 x 6   Entered and Authorized by:   Julieanne Manson MD   Signed by:   Julieanne Manson MD on 07/29/2010   Method used:   Electronically to        [facility] [phone]* (retail)       [address]       [place], [place]  [address]       Ph: [phone]       Fax: [phone]   RxID:   [id number] WAVESENSE ULTRA-THIN LANCETS  MISC (LANCETS) Test blood glucose twice daily  #100 x 11   Entered and Authorized by:   Julieanne Manson MD   Signed by:   Julieanne Manson MD on 07/29/2010   Method used:   Faxed to ...       [facility] - [facility] (retail)       [address] [place], [place]  [address]       Ph: [phone]       Fax: [phone]   RxID:   [id number] WAVESENSE KEYNOTE TEST  STRP (GLUCOSE BLOOD) Test blood sugar twice daily  #100 x 11   Entered and Authorized by:   Julieanne Manson MD   Signed by:   Julieanne Manson MD on 07/29/2010   Method used:   Faxed to ...       [facility] - [facility] (retail)       [address] [place], [place]  [address]       Ph: [phone]       Fax: [phone]   RxID:   [id number] AMITRIPTYLINE HCL 25 MG TABS (AMITRIPTYLINE HCL) 1 tab by mouth daily  #30 x 11   Entered and Authorized by:   Julieanne Manson MD   Signed by:   Julieanne Manson MD on 07/29/2010   Method used:   Faxed to ...       [facility] - [facility] (retail)       [address] [place], [place]  [address]       Ph: [phone]       Fax: [phone]   RxID:  [id number] SINGULAIR 10 MG TABS (MONTELUKAST SODIUM) 1 tab by mouth daily  #30 x 11   Entered and Authorized by:   Julieanne Manson MD   Signed by:   Julieanne Manson MD on 07/29/2010   Method used:   Faxed to ...       [facility] - [facility]  (retail)       [address] [place], [place]  [address]       Ph: [phone]       Fax: [phone]   RxID:   [id number] NASACORT AQ 55 MCG/ACT AERS (TRIAMCINOLONE ACETONIDE(NASAL)) 2 sprays each nostril daily  #1 x 11   Entered and Authorized by:   Julieanne Manson MD   Signed by:   Julieanne Manson MD on 07/29/2010   Method used:   Faxed to ...       [facility] - [facility] (retail)       [address] [place], [place]  [address]       Ph: [phone]       Fax: [phone]   RxID:   [id number] ALLOPURINOL 100 MG TABS (ALLOPURINOL) 2 tabs by mouth daily  #60 x 11   Entered and Authorized by:   Julieanne Manson MD   Signed by:   Julieanne Manson MD on 07/29/2010   Method used:   Faxed to ...       [facility] - [facility] (retail)       [address] [place], [place]  [address]       Ph: [phone]       Fax: [phone]   RxID:   [id number] LIPITOR 10 MG TABS (ATORVASTATIN CALCIUM) 1 tab by mouth daily  #30 x 11   Entered and Authorized by:   Julieanne Manson MD   Signed by:   Julieanne Manson MD on 07/29/2010   Method used:   Faxed to ...       [facility] - [facility] (retail)       [address] [place], [place]  [address]       Ph: [phone]       Fax: [phone]   RxID:   [id number] LORATADINE 10 MG TABS (LORATADINE) 1 tab by mouth daily  #30 x 11   Entered and Authorized by:   Julieanne Manson MD   Signed by:   Julieanne Manson MD on 07/29/2010   Method used:   Faxed to ...       [facility] - [facility] (retail)       [address] [place], [place]  [address]       Ph: [phone]       Fax: [phone]   RxID:   [id number] NEXIUM 40 MG  CPDR (ESOMEPRAZOLE MAGNESIUM) 1 cap by mouth daily  #30 x 11   Entered and Authorized by:   Julieanne Manson MD   Signed by:    Julieanne Manson MD on 07/29/2010   Method used:   Faxed to ...       [facility] - [facility] (retail)       [address] [place], [place]  [address]       Ph: [phone]  Fax: [phone]   RxID:   [id number] PINDOLOL 5 MG  TABS (PINDOLOL) 2 tabs by mouth bid  #120 x 11   Entered and Authorized by:   Julieanne Manson MD   Signed by:   Julieanne Manson MD on 07/29/2010   Method used:   Faxed to ...       [facility] - [facility] (retail)       [address] [place], [place]  [address]       Ph: [phone]       Fax: [phone]   RxID:   [id number] CLINDAMYCIN HCL 150 MG CAPS (CLINDAMYCIN HCL) 1 cap by mouth three times a day for 7 days  #21 x 0   Entered and Authorized by:   Julieanne Manson MD   Signed by:   Julieanne Manson MD on 07/29/2010   Method used:   Faxed to ...       [facility] - [facility] (retail)       [address] [place], [place]  [address]       Ph: [phone]       Fax: [phone]   RxID:   [id number] METFORMIN HCL 1000 MG TABS (METFORMIN HCL) 1 tab by mouth two times a day with meals  #60 x 11   Entered and Authorized by:   Julieanne Manson MD   Signed by:   Julieanne Manson MD on 07/29/2010   Method used:   Electronically to        [facility] [phone]* (retail)       [address]       [place], [place]  [address]       Ph: [phone]       Fax: [phone]   RxID:   [id number]    Orders Added: 1)  Capillary Blood Glucose/CBG [82948] 2)  Hgb A1C [83036QW] 3)  UA Dipstick w/o Micro (automated)  [81003] 4)  Est. Patient age 3-64 [21] 5)  UA Dipstick w/o Micro (automated)  [81003] 6)  KOH/ WET Mount [87210] 7)  Pap Smear, Thin Prep ( Collection of) [Q0091] 8)  T- GC Chlamydia [38101] 9)  T-HIV Antibody  (Reflex) [[id number]] 10)  T-Pap Smear, Thin Prep [88142] 11)  T-Syphilis Test  (RPR) [78[address]-35361] 12)  Nutrition Referral [Nutrition]     Preventive Care Screening  Prior Values:    Pap Smear:  Normal (08/01/2006)    Mammogram:  ASSESSMENT: Negative - BI-RADS 1^MM DIGITAL SCREENING (06/24/2009)    Last Tetanus Booster:  Td (03/23/2006)    Last Flu Shot:  Fluvax 3+ (03/26/2010)    Last Pneumovax:  Pneumovax (04/03/2007)     Pap:  always normal in past Mammogram:  has appt. tomorrow for this SBE:  No LMP:  07/24/10--regular.  No problems Osteoprevention:  1-2 servings daily.  Not exercising currently. Guaiac Cards:    Laboratory Results   Blood Tests   Date/Time Received: July 29, 2010 9:39 AM   HGBA1C: 10.4%   (Normal Range: Non-Diabetic - 3-6%   Control Diabetic - 6-8%) CBG Random:: 285mg /dL    Wet Mount Source: vaginal WBC/hpf: 1-5 Bacteria/hpf: 1+ Clue cells/hpf: none Yeast/hpf: none Wet Mount KOH: Negative Trichomonas/hpf: none   Laboratory Results   Blood Tests     HGBA1C: 10.4%   (Normal Range: Non-Diabetic - 3-6%   Control Diabetic - 6-8%) CBG Random:: 285    Wet  Mount/KOH   Appended Document: UA and rapid HIV     Allergies: 1)  ! Keflex 2)  ! Neosporin 3)  Adhesive Bandages (Adhesive Bandages)   Complete Medication List: 1)  Pindolol 5 Mg Tabs (Pindolol) .... 2 tabs by mouth bid 2)  Nexium 40 Mg Cpdr (Esomeprazole magnesium) .Marland Kitchen.. 1 cap by mouth daily 3)  Metformin Hcl 1000 Mg Tabs (Metformin hcl) .Marland Kitchen.. 1 tab by mouth two times a day with meals 4)  Loratadine 10 Mg Tabs (Loratadine) .Marland Kitchen.. 1 tab by mouth daily 5)  Tens Unit  .... Use as directed by pt  dx 724.4 6)  Lipitor 10 Mg Tabs (Atorvastatin calcium) .Marland Kitchen.. 1 tab by mouth daily 7)  Indomethacin 50 Mg Caps (Indomethacin) .Marland Kitchen.. 1 tab by mouth two times a day with food as needed gouty pain 8)  Allopurinol 100 Mg Tabs (Allopurinol) .... 2 tabs by mouth daily 9)  Nasacort Aq 55 Mcg/act Aers (Triamcinolone acetonide(nasal)) .... 2 sprays each nostril daily 10)   Singulair 10 Mg Tabs (Montelukast sodium) .Marland Kitchen.. 1 tab by mouth daily 11)  Amitriptyline Hcl 25 Mg Tabs (Amitriptyline hcl) .Marland Kitchen.. 1 tab by mouth daily 12)  Anusol-hc 25 Mg Supp (Hydrocortisone acetate) .Marland Kitchen.. 1 per rectum as needed hemorrhoids 13)  Katheren Puller Keynote W/device Kit (Blood glucose monitoring suppl) .... Check blood glucose twice daily  dx 250.02 14)  Wavesense Keynote Test Strp (Glucose blood) .... Test blood sugar twice daily 15)  Wavesense Ultra-thin Lancets Misc (Lancets) .... Test blood glucose twice daily 16)  Clindamycin Hcl 150 Mg Caps (Clindamycin hcl) .Marland Kitchen.. 1 cap by mouth three times a day for 7 days 17)  Glipizide 5 Mg Tabs (Glipizide) .Marland Kitchen.. 1 tab by mouth in morning with meal 18)  Fluconazole 150 Mg Tabs (Fluconazole) .Marland Kitchen.. 1 tab by mouth daily for 3 days after completing clindamycin (antibiotic)       Laboratory Results   Urine Tests  Date/Time Received: July 29, 2010 12:04 PM   Routine Urinalysis   Color: lt. yellow Glucose: >=1000   (Normal Range: Negative) Bilirubin: negative   (Normal Range: Negative) Ketone: negative   (Normal Range: Negative) Spec. Gravity: 1.010   (Normal Range: 1.003-1.035) Blood: negative   (Normal Range: Negative) pH: 5.0   (Normal Range: 5.0-8.0) Protein: negative   (Normal Range: Negative) Urobilinogen: 0.2   (Normal Range: 0-1) Nitrite: negative   (Normal Range: Negative) Leukocyte Esterace: negative   (Normal Range: Negative)    Date/Time Received: July 29, 2010 12:05 PM   Other Tests  Rapid HIV: negative

## 2010-08-03 NOTE — Progress Notes (Signed)
Summary: Office Visit//DEPRESSION SCREENING  Office Visit//DEPRESSION SCREENING   Imported By: Arta Bruce 07/29/2010 12:28:30  _____________________________________________________________________  External Attachment:    Type:   Image     Comment:   External Document

## 2010-08-03 NOTE — Assessment & Plan Note (Signed)
Summary: Vaginal itching  Nurse Visit   Vital Signs:  Patient profile:   40 year old female Menstrual status:  regular Weight:      174.4 pounds Temp:     98.2 degrees F oral Pulse rate:   92 / minute Pulse rhythm:   regular Resp:     16 per minute BP sitting:   144 / 86  (right arm) Cuff size:   regular  Vitals Entered By: Dutch Quint RN (July 06, 2010 4:01 PM)  Patient Instructions: 1)  Reviewed with Dr. Delrae Alfred 2)  You do not have a yeast infection. 3)  Stop using any vaginal products for itching.  Just wash with unscented soap, such as Dove for sensitive skin or vagisil and water -- do not use any scented or deodorant hygiene products. 4)  If the itching does not stop in a week or two, call and make appointment to have repeat test -- do not use any vaginal products! 5)  Make sure you keep follow-up appointment with provider in February. 6)  We have sent prescriptions for a glucose meter, strips and lancets over to the [facility] pharmacy. 7)  Call if anything changes or if you have any questions.   Impression & Recommendations:  Problem # 1:  VAGINAL PRURITUS (ICD-698.1) Persistent vaginal itching No discharge, odor - particulate when wiping after void   Orders: KOH/ WET Mount [phone]) UA Dipstick w/o Micro (automated)  (81003)  Complete Medication List: 1)  Pindolol 5 Mg Tabs (Pindolol) .... 2 tabs by mouth bid 2)  Nexium 40 Mg Cpdr (Esomeprazole magnesium) .Marland Kitchen.. 1 cap by mouth daily 3)  Metformin Hcl 500 Mg Tabs (Metformin hcl) .Marland Kitchen.. 1 tab by mouth two times a day with meals 4)  Loratadine 10 Mg Tabs (Loratadine) .Marland Kitchen.. 1 tab by mouth daily 5)  Tens Unit  .... Use as directed by pt  dx 724.4 6)  Lipitor 10 Mg Tabs (Atorvastatin calcium) .Marland Kitchen.. 1 tab by mouth daily 7)  Indomethacin 50 Mg Caps (Indomethacin) .Marland Kitchen.. 1 tab by mouth two times a day with food as needed gouty pain 8)  Allopurinol 100 Mg Tabs (Allopurinol) .... 2 tabs by mouth daily 9)  Nasacort Aq 55  Mcg/act Aers (Triamcinolone acetonide(nasal)) .... 2 sprays each nostril daily 10)  Singulair 10 Mg Tabs (Montelukast sodium) .Marland Kitchen.. 1 tab by mouth daily 11)  Amitriptyline Hcl 25 Mg Tabs (Amitriptyline hcl) .Marland Kitchen.. 1 tab by mouth daily 12)  Anusol-hc 25 Mg Supp (Hydrocortisone acetate) .Marland Kitchen.. 1 per rectum as needed hemorrhoids 13)  Katheren Puller Keynote W/device Kit (Blood glucose monitoring suppl) .... Check blood glucose twice daily  dx 250.02 14)  Wavesense Keynote Test Strp (Glucose blood) .... Test blood sugar twice daily 15)  Wavesense Ultra-thin Lancets Misc (Lancets) .... Test blood glucose twice daily  CC: Vaginal itching Is Patient Diabetic? Yes Did you bring your meter with you today? No Pain Assessment Patient in pain? yes     Location: rectal Intensity: 8 Type: throbbing, stabbing Onset of pain  had hemorrhoid surgery 07/02/10  Does patient need assistance? Functional Status Self care Ambulation Normal   CC:  Vaginal itching.  History of Present Illness: Called on 06/21/10 - having vaginal itching.  Had hemorrhoid surgery 07/02/10.  States Monistat 7 did not help.  Still having bad vaginal itching, looks like "dirt" when she wipes after toileting.     Review of Systems GU:  Complains of nocturia; States looks like "dirt" when she wipes, no discharge,  no odor.  Just having bad itching, itches worse after she voids and wipes.  Denies abdominal pain or cramping, is not having her menses at present..   Physical Exam  General:  alert, well-developed, well-nourished, and well-hydrated.     Allergies: 1)  ! Keflex 2)  ! Neosporin Laboratory Results   Urine Tests  Date/Time Received: July 06, 2010 4:22 PM   Routine Urinalysis   Color: yellow Glucose: >=1000   (Normal Range: Negative) Bilirubin: negative   (Normal Range: Negative) Ketone: moderate (40)   (Normal Range: Negative) Spec. Gravity: >=1.030   (Normal Range: 1.003-1.035) Blood: trace-intact   (Normal Range:  Negative) pH: 5.0   (Normal Range: 5.0-8.0) Protein: negative   (Normal Range: Negative) Urobilinogen: 0.2   (Normal Range: 0-1) Nitrite: negative   (Normal Range: Negative) Leukocyte Esterace: negative   (Normal Range: Negative)      Wet Mount Source: self swab WBC/hpf: 1-5 Bacteria/hpf: 1+ Clue cells/hpf: none  Negative whiff Yeast/hpf: none Wet Mount KOH: Negative Trichomonas/hpf: none   Orders Added: 1)  Est. Patient Level II [[address]] 2)  KOH/ WET Mount [87210] 3)  UA Dipstick w/o Micro (automated)  [81003] Prescriptions: WAVESENSE KEYNOTE W/DEVICE KIT (BLOOD GLUCOSE MONITORING SUPPL) Check blood glucose twice daily  Dx 250.02  #1 x 0   Entered by:   Dutch Quint RN   Authorized by:   Julieanne Manson MD   Signed by:   Dutch Quint RN on 07/06/2010   Method used:   Faxed to ...       [facility] (retail)       [address] [place], [place]  [address]       Ph: [phone]       Fax: [phone]   RxID:   [id number] WAVESENSE ULTRA-THIN LANCETS  MISC (LANCETS) Test blood glucose twice daily  #100 x 3   Entered by:   Dutch Quint RN   Authorized by:   Julieanne Manson MD   Signed by:   Dutch Quint RN on 07/06/2010   Method used:   Faxed to ...       [facility] (retail)       [address] [place], [place]  [address]       Ph: [phone]       Fax: [phone]   RxID:   [id number] WAVESENSE KEYNOTE TEST  STRP (GLUCOSE BLOOD) Test blood sugar twice daily  #50 x 3   Entered by:   Dutch Quint RN   Authorized by:   Julieanne Manson MD   Signed by:   Dutch Quint RN on 07/06/2010   Method used:   Faxed to ...       [facility] (retail)       [address] [place], [place]  [address]       Ph: [phone]       Fax: [phone]   RxID:   [id number] Pollyann Samples W/DEVICE KIT (BLOOD GLUCOSE  MONITORING SUPPL) Check blood glucose twice daily  Dx 250.02  #1 x 0   Entered by:   Dutch Quint RN   Authorized by:   Julieanne Manson MD   Signed by:   Dutch Quint RN on 07/06/2010   Method used:   Electronically to        [facility]  [address] (retail)       [phone]       [place], [place]  [address]       Ph: [phone]       Fax: [phone]   RxID:   [id number]   Laboratory Results   Urine Tests    Routine Urinalysis   Color: yellow Glucose: >=1000   (Normal Range: Negative) Bilirubin: negative   (Normal Range: Negative) Ketone: moderate (40)   (Normal Range: Negative) Spec. Gravity: >=1.030   (Normal Range: 1.003-1.035) Blood: trace-intact   (Normal Range: Negative) pH: 5.0   (Normal Range: 5.0-8.0) Protein: negative   (Normal Range: Negative) Urobilinogen: 0.2   (Normal Range: 0-1) Nitrite: negative   (Normal Range: Negative) Leukocyte Esterace: negative   (Normal Range: Negative)      Wet Mount/KOH  Negative whiff

## 2010-08-03 NOTE — Progress Notes (Signed)
  Phone Note Outgoing Call   Summary of Call: Please call pt--does she have Medicaid now?  Note from [facility] that they cannot get a Wavesense glucose monitor, which normally would go to [facility] she does not have Medicaid, she needs to take to [facility] where she can get her strips. Initial call taken by: Julieanne Manson MD,  July 16, 2010 7:52 AM  Follow-up for Phone Call        Dr. Delrae Alfred -- Called pt. -- Has p/u [facility] monitor and strips -- Also wants to let you know that she will be going to [facility] urgent care today since her vaginal itching has gotten worse. -- Has broken out w/a rash on the ouside of vagina and legs. -- Noticing a black discharge -- Wiegel when she urinates -- No appt. avail -- Adv. her to keep the f/u appt. w/you on 07/29/10 -- Hale Drone CMA  July 19, 2010 2:42 PM

## 2010-08-19 ENCOUNTER — Encounter (INDEPENDENT_AMBULATORY_CARE_PROVIDER_SITE_OTHER): Payer: Self-pay | Admitting: Internal Medicine

## 2010-08-24 NOTE — Letter (Signed)
Summary: *HSN Results Follow up  [facility]  [address] [place], [place] [address]   Phone: [phone]  Fax: [phone]      08/19/2010   [facility] [place] [address], [place]  [address]   Dear  Ms. Parrie Ulysse,                            ____S.Drinkard,FNP   ____D. Gore,FNP       ____B. McPherson,MD   ____V. Rankins,MD    _X___E. Roald Lukacs,MD    ____N. Daphine Deutscher, FNP  ____D. Reche Dixon, MD    ____K. Philipp Deputy, MD    ____Other     This letter is to inform you that your recent test(s):  ___X____Pap Smear    ____X___Lab Test     _______X-ray    ____X___ is within acceptable limits  _______ requires a medication change  _______ requires a follow-up lab visit  _______ requires a follow-up visit with your provider   Comments:  STD testing was all okay and pap was fine as well.       _________________________________________________________ If you have any questions, please contact our office                     Sincerely,  Julieanne Manson MD [facility]

## 2010-08-27 ENCOUNTER — Encounter (INDEPENDENT_AMBULATORY_CARE_PROVIDER_SITE_OTHER): Payer: Self-pay | Admitting: *Deleted

## 2010-08-30 ENCOUNTER — Other Ambulatory Visit: Payer: Self-pay | Admitting: General Surgery

## 2010-08-30 ENCOUNTER — Encounter (HOSPITAL_COMMUNITY): Payer: Self-pay | Attending: General Surgery

## 2010-08-30 DIAGNOSIS — Z01812 Encounter for preprocedural laboratory examination: Secondary | ICD-10-CM | POA: Insufficient documentation

## 2010-08-30 LAB — BASIC METABOLIC PANEL
BUN: 6 mg/dL (ref 6–23)
CO2: 25 mEq/L (ref 19–32)
Chloride: 105 mEq/L (ref 96–112)
Creatinine, Ser: 0.59 mg/dL (ref 0.4–1.2)
Glucose, Bld: 75 mg/dL (ref 70–99)
Potassium: 3.4 mEq/L — ABNORMAL LOW (ref 3.5–5.1)

## 2010-08-30 LAB — CBC
Hemoglobin: 12 g/dL (ref 12.0–15.0)
MCH: 27.3 pg (ref 26.0–34.0)
MCHC: 32.3 g/dL (ref 30.0–36.0)
MCV: 84.5 fL (ref 78.0–100.0)
RBC: 4.39 MIL/uL (ref 3.87–5.11)

## 2010-08-30 LAB — SURGICAL PCR SCREEN: Staphylococcus aureus: NEGATIVE

## 2010-08-31 ENCOUNTER — Ambulatory Visit (HOSPITAL_COMMUNITY)
Admission: RE | Admit: 2010-08-31 | Discharge: 2010-08-31 | Disposition: A | Discharge status: Home / Self Care | Payer: Self-pay | Source: Ambulatory Visit | Attending: General Surgery | Admitting: General Surgery

## 2010-08-31 ENCOUNTER — Other Ambulatory Visit: Payer: Self-pay | Admitting: General Surgery

## 2010-08-31 DIAGNOSIS — K644 Residual hemorrhoidal skin tags: Secondary | ICD-10-CM | POA: Insufficient documentation

## 2010-08-31 DIAGNOSIS — M109 Gout, unspecified: Secondary | ICD-10-CM | POA: Insufficient documentation

## 2010-08-31 DIAGNOSIS — E119 Type 2 diabetes mellitus without complications: Secondary | ICD-10-CM | POA: Insufficient documentation

## 2010-08-31 DIAGNOSIS — F17200 Nicotine dependence, unspecified, uncomplicated: Secondary | ICD-10-CM | POA: Insufficient documentation

## 2010-08-31 DIAGNOSIS — Z79899 Other long term (current) drug therapy: Secondary | ICD-10-CM | POA: Insufficient documentation

## 2010-08-31 DIAGNOSIS — Z01812 Encounter for preprocedural laboratory examination: Secondary | ICD-10-CM | POA: Insufficient documentation

## 2010-08-31 DIAGNOSIS — I10 Essential (primary) hypertension: Secondary | ICD-10-CM | POA: Insufficient documentation

## 2010-08-31 DIAGNOSIS — M279 Disease of jaws, unspecified: Secondary | ICD-10-CM | POA: Insufficient documentation

## 2010-08-31 DIAGNOSIS — Z91040 Latex allergy status: Secondary | ICD-10-CM | POA: Insufficient documentation

## 2010-08-31 LAB — GLUCOSE, CAPILLARY: Glucose-Capillary: 180 mg/dL — ABNORMAL HIGH (ref 70–99)

## 2010-09-01 ENCOUNTER — Ambulatory Visit: Payer: Self-pay | Admitting: *Deleted

## 2010-09-02 NOTE — Letter (Signed)
Summary: *HSN Results Follow up  [facility]  [address] [place], [place] [address]   Phone: [phone]  Fax: [phone]      08/27/2010   [facility] [name] [address] [place], [place]  [address]   Dear  Ms. Deborah [name],                            Comments: WHEN YOU CAME TO [facility] 07-29-10 TO SEE DR MULBERRY SHE MENTION TO YOU THAT THE NUTRITIONIST WILL CALL YOU THEY BEING TRYNG TO CONTACT YOU TO SCHEDULE AN APPT PLEASE CALLED [phone] TO SHEDULE AN APPT AT YOUR EARLIEST CONVINIENCE  THANK YOU.        _________________________________________________________ If you have any questions, please contact our office                     Sincerely,  Cheryll Dessert [facility]

## 2010-09-06 ENCOUNTER — Ambulatory Visit (HOSPITAL_COMMUNITY): Payer: Self-pay | Admitting: Dentistry

## 2010-09-06 DIAGNOSIS — K0889 Other specified disorders of teeth and supporting structures: Secondary | ICD-10-CM

## 2010-09-06 DIAGNOSIS — M278 Other specified diseases of jaws: Secondary | ICD-10-CM

## 2010-09-06 NOTE — Op Note (Signed)
  NAME:  Deborah [name], Deborah [name]               ACCOUNT NO.:  [id number]  MEDICAL RECORD NO.:  [id number]           PATIENT TYPE:  O  LOCATION:  DAYL                         FACILITY:  [facility]  PHYSICIAN:  Sharlet Salina T. Shaena Parkerson, M.D.DATE OF BIRTH:  [birth year]  DATE OF PROCEDURE: DATE OF DISCHARGE:                              OPERATIVE REPORT   PREOPERATIVE DIAGNOSIS:  Inflamed external hemorrhoids.  POSTOPERATIVE DIAGNOSIS:  Inflamed external hemorrhoids.  SURGICAL PROCEDURES:  Hemorrhoidectomy, single column.  SURGEON:  Lorne Skeens. Jia Dottavio, M.D.  ANESTHESIA:  General.  BRIEF HISTORY:  Deborah [name] is a 40 year old female who is several weeks status post PPH for severe prolapsing and bleeding internal hemorrhoids.  She generally has done well from this procedure with relief of her bleeding and prolapse but has developed a single swollen, tender external hemorrhoid postoperatively, which continues to remain symptomatic on followup.  We discussed options of excision versus continued conservative management and have elected to proceed with removal.  We discussed risks of bleeding, infection, anesthetic, complications and alternatives.  She brought to the operating room for the procedure.  DESCRIPTION OF OPERATION:  The patient underwent a rectal prep at home. She is brought to the operating room and placed in supine position on the operating table and general endotracheal anesthesia was induced. She was carefully placed in lithotomy position, the perineum sterilely prepped and draped.  Correct patient's procedure was confirmed.  Rectal exam showed PPH staple line widely patent and intact.  It was visualized with a bullet retractor.  It was somewhat friable and was little bleeding with manipulation, but otherwise, it appeared to be healing well.  The anal canal was normal, except for the posterior midline which contained a small to moderate-sized, slightly inflamed external hemorrhoid.   This was exposed and a 3-0 chromic suture placed at the apex.  The hemorrhoid was then elliptically excised out on to the anoderm and dissected up off the anal sphincter which was carefully protected and completely removed.  Hemostasis obtained with cautery and with the running 2-0 chromic suture which was used to close the incision.  Remainder of the anal canal appeared normal.  The operative site was infiltrated with 0.25% Marcaine with epinephrine.  Sponge, needle, instrument counts correct.  Gelfoam pack was placed to friability of PPH staple line.  The patient was taken recovery in good condition.    Lorne Skeens. Xaniyah Buchholz, M.D.    Tory Emerald  D:  08/31/2010  T:  08/31/2010  Job:  [id number]  Electronically Signed by Glenna Fellows M.D. on 09/06/2010 01:02:29 PM

## 2010-09-13 LAB — CBC
HCT: 34 % — ABNORMAL LOW (ref 36.0–46.0)
Platelets: 191 10*3/uL (ref 150–400)
RBC: 3.93 MIL/uL (ref 3.87–5.11)
WBC: 11.7 10*3/uL — ABNORMAL HIGH (ref 4.0–10.5)

## 2010-09-13 LAB — DIFFERENTIAL
Eosinophils Relative: 1 % (ref 0–5)
Lymphocytes Relative: 17 % (ref 12–46)
Lymphs Abs: 2 10*3/uL (ref 0.7–4.0)
Neutrophils Relative %: 72 % (ref 43–77)

## 2010-09-13 LAB — URINALYSIS, ROUTINE W REFLEX MICROSCOPIC
Nitrite: NEGATIVE
Protein, ur: NEGATIVE mg/dL
Urobilinogen, UA: 4 mg/dL — ABNORMAL HIGH (ref 0.0–1.0)

## 2010-09-13 LAB — WET PREP, GENITAL: Yeast Wet Prep HPF POC: NONE SEEN

## 2010-09-13 LAB — URINE MICROSCOPIC-ADD ON

## 2010-09-21 LAB — URINALYSIS, ROUTINE W REFLEX MICROSCOPIC
Glucose, UA: NEGATIVE mg/dL
Ketones, ur: NEGATIVE mg/dL
Protein, ur: NEGATIVE mg/dL

## 2010-09-21 LAB — CBC
Hemoglobin: 13.1 g/dL (ref 12.0–15.0)
MCHC: 33.2 g/dL (ref 30.0–36.0)
MCV: 84.8 fL (ref 78.0–100.0)
RBC: 4.65 MIL/uL (ref 3.87–5.11)

## 2010-09-21 LAB — BASIC METABOLIC PANEL
CO2: 27 mEq/L (ref 19–32)
Chloride: 98 mEq/L (ref 96–112)
GFR calc Af Amer: >60 mL/min (ref >60)
Sodium: 134 mEq/L — ABNORMAL LOW (ref 135–145)

## 2010-09-21 LAB — WET PREP, GENITAL: Trich, Wet Prep: NONE SEEN

## 2010-09-21 LAB — URINE MICROSCOPIC-ADD ON

## 2010-09-21 LAB — GC/CHLAMYDIA PROBE AMP, GENITAL: GC Probe Amp, Genital: NEGATIVE

## 2010-09-21 LAB — DIFFERENTIAL
Basophils Relative: 1 % (ref 0–1)
Eosinophils Absolute: 0.1 10*3/uL (ref 0.0–0.7)
Monocytes Absolute: 0.7 10*3/uL (ref 0.1–1.0)
Monocytes Relative: 7 % (ref 3–12)

## 2010-09-27 ENCOUNTER — Ambulatory Visit: Payer: Self-pay | Admitting: *Deleted

## 2010-10-13 ENCOUNTER — Ambulatory Visit: Payer: Self-pay | Admitting: *Deleted

## 2010-10-19 NOTE — Group Therapy Note (Signed)
NAME:  Deborah [name], Deborah [name]               ACCOUNT NO.:  [id number]   MEDICAL RECORD NO.:  [id number]          PATIENT TYPE:  WOC   LOCATION:  [facility]                   FACILITY:  [facility]   PHYSICIAN:  Dorthula Perfect, MD     DATE OF BIRTH:  14-Oct-1970   DATE OF SERVICE:  11/20/2008                                  CLINIC NOTE   [age]-year-old black female gravida 0 returns today for followup from an  MAU visit on June 3.  She was seen there with abdominal pain, severe in  both lower quadrants.  As part of the evaluation she had an ultrasound  which revealed both of her fallopian tubes to be dilated and the  suggestion of the left tube containing a pyosalpinx.  There are no  actual measurements of the fallopian tubes.  She also had a pedunculated  fibroid measuring 5.2 x 5.7 x 5,9 cm.  She was treated with antibiotics,  treated with doxycycline 100 mg take one b.i.d. for 14 days, Flagyl 500  mg one b.i.d. for 14 days and 1 tablet of Diflucan.  She states she has  been following the instructions, but she still has a lot of pills left  of both the doxycycline and the Flagyl.  Today she is completely  asymptomatic.   CULTURES:  GC and Chlamydia cultures were obtained and the results show  that both were negative.   PHYSICAL EXAM:  Weight 164, blood pressure 155/93.  ABDOMEN:  Soft and completely nontender.  PELVIC EXAM:  External genitalia and BS glands are normal.  Vaginal  vault epithelialized as was the cervix.  Bimanual examination:  Uterus  is of normal size and shape.  Cervix, uterus are completely nontender.  The ovaries are not palpable.  The adnexal areas are completely  nontender.   DISPOSITION:  Pelvic inflammatory disease, probably chronic, resolved.   DISPOSITION:  She is instructed to finish up both the doxycycline and  the metronidazole.           ______________________________  Dorthula Perfect, MD     ER/MEDQ  D:  11/20/2008  T:  11/20/2008  Job:  [id number]

## 2010-10-21 ENCOUNTER — Encounter: Payer: Self-pay | Attending: Internal Medicine | Admitting: *Deleted

## 2010-10-21 DIAGNOSIS — Z713 Dietary counseling and surveillance: Secondary | ICD-10-CM | POA: Insufficient documentation

## 2010-10-21 DIAGNOSIS — E119 Type 2 diabetes mellitus without complications: Secondary | ICD-10-CM | POA: Insufficient documentation

## 2010-10-22 NOTE — H&P (Signed)
NAMEKENNAH, Deborah [name]               ACCOUNT NO.:  [id number]   MEDICAL RECORD NO.:  [id number]          PATIENT TYPE:  EMS   LOCATION:  ED                           FACILITY:  [facility]   PHYSICIAN:  Isidor Holts, M.D.  DATE OF BIRTH:  [birth year]   DATE OF ADMISSION:  03/28/2005  DATE OF DISCHARGE:                                HISTORY & PHYSICAL   PMD:  Unassigned.   CHIEF COMPLAINT:  Right facial swelling/redness for four days.   HISTORY OF PRESENT ILLNESS:  This is a 40 year old female, with no  significant past medical history.  She was plucking her eyebrows on March 25, 2005, when the right eyebrow started bleeding.  She applied some  Neosporin cream to the area, and has done so daily.  On March 26, 2005,  she noted a puffed up appearance on the right side of her face, and the  area became itchy and red.  This has progressed.  By October, 22, 2006, was  finding it difficult to open her right eye.  Her boyfriend brought her to  the emergency department at [facility] on March 28, 2005.  She denies pain.  She denies visual disturbance.  She denies fever or  eye discharge.   PAST MEDICAL HISTORY:  Nothing of note.   MEDICATIONS:  Nothing regular.  As noted above, she applied Neosporin cream  topically to the right periorbital region daily, since March 25, 2005.   ALLERGIES:  No known drug allergies.   REVIEW OF SYSTEMS:  As per HPI and chief complaint, otherwise negative.   FAMILY/SOCIAL HISTORY:  Patient is unemployed.  She is single.  Has no  offspring.  She is a smoker, about 2-3 cigarettes per day.  Drinks alcohol  only occasionally.  Denies drug abuse.  Family history is otherwise  noncontributory.   PHYSICAL EXAMINATION:  VITALS:  Temperature 98.5, pulse 99 per minute and  regular, respiratory rate 18, BP 158/102 mmHg.  Pulse oximetry 99% on room  air.  GENERAL:  Patient appears alert and oriented.  Not in obvious acute  distress.  Not  short of breath at rest.  HEENT:  Patient has marked swelling, right periorbital region with erythema  and miliary papules in the region of the right brow.  No redness below the  right palpebral fissure.  That area appears edematous.  Extraocular eye  muscles movements are intact bilaterally.  There is no obvious conjunctival  injection or exudate.  NECK:  Supple.  JVP is not seen.  No palpable lymphadenopathy.  No palpable  goiter.  LUNGS:  Clinically clear to auscultation.  No wheezes or crackles.  HEART:  S1 and S2 heard.  No murmur, rub or gallop.  ABDOMEN:  Full, soft, and nontender.  There is no palpable organomegaly.  No  palpable masses.  Normal bowel sounds.  EXTREMITIES:  No pitting edema.  Palpable peripheral pulses.  MUSCULOSKELETAL:  Unremarkable.  CENTRAL NERVOUS SYSTEM:  No focal neurologic deficits on gross examination.   INVESTIGATIONS:  CBC:  WBC 10.4, hemoglobin 11.8, hematocrit 37.5, platelets  233.  Electrolytes:  Sodium 136, potassium 4.3, chloride 101, CO2 25, BUN  11, creatinine 0.9, glucose 112.  LFTs are normal.   ASSESSMENT/PLAN:  1.  Periorbital cellulitis:  +/-  Neosporin allergy.  We shall admit      patient.  Do blood cultures for completeness.  Arrange orbital CT scan      to rule out global involvement.  Commence intravenous vancomycin,      antihistamines, and steroids.  The patient may need ENT input.  We shall      therefore consult accordingly.   1.  Elevated blood pressure. Likely secondary to anxiety:  Patient has no      documented prior history of hypertension.  We shall monitor for now, and      address appropriately if persists.   Further management will depend on clinical course.      Isidor Holts, M.D.  Electronically Signed     CO/MEDQ  D:  03/28/2005  T:  03/28/2005  Job:  [id number]

## 2010-10-22 NOTE — Consult Note (Signed)
NAME:  [name], Deborah               ACCOUNT NO.:  [id number]   MEDICAL RECORD NO.:  [id number]          PATIENT TYPE:  INP   LOCATION:  5729                         FACILITY:  [facility]   PHYSICIAN:  Jefry H. Pollyann Kennedy, MD     DATE OF BIRTH:  Oct 07, 1970   DATE OF CONSULTATION:  03/29/2005  DATE OF DISCHARGE:                                   CONSULTATION   REQUESTING SERVICE:  [facility].   REASON FOR CONSULTATION:  Swelling of the eye.   HISTORY:  This is a 40 year old female who about 4 days ago was plucking her  right eyebrow and developed some bleeding.  She then started using some  Neosporin ointment and then developed severe swelling over the past couple  of days of the right eyelid.  At the time of admission to the [facility]  Emergency Department, her eyelid was swollen shut.  CT scan was performed at  the [facility] Emergency Department and there was no evidence of abscess;  there was simply preseptal soft tissue swelling.  There was no evidence of  any sinus disease.  I have reviewed these films.   PAST MEDICAL HISTORY:  Noncontributory, although she did have a similar  reaction on her leg at some time in the past also related to using  Neosporin.   EXAMINATION:  GENERAL:  She is a healthy-appearing lady in no distress.  HEAD AND NECK:  There is some periorbital edema on the right side, but her  globe looks normal and healthy with normal grossly intact vision and normal  mobility.  There is some erythema of the skin around the eyebrows and some  scaly type of dermatitis.  There is edema present today around the upper and  lower lid.  There is no chemosis or hyphema.  Remainder of the head and neck  examination is unremarkable.   IMPRESSION:  probable Neosporin allergy.  The other possibility is a Staph  infection or possibly MRSA.  She is on appropriate antibiotic coverage for  this.  No evidence of abscess or any surgical disease present.   FOLLOWUP:  Followup with me  as needed.      Jefry H. Pollyann Kennedy, MD  Electronically Signed     JHR/MEDQ  D:  03/29/2005  T:  03/29/2005  Job:  [id number]

## 2010-10-22 NOTE — Discharge Summary (Signed)
NAME:  Deborah [name], Deborah [name]               ACCOUNT NO.:  [id number]   MEDICAL RECORD NO.:  [id number]          PATIENT TYPE:  INP   LOCATION:  5729                         FACILITY:  [facility]   PHYSICIAN:  Michaelyn Barter, M.D. DATE OF BIRTH:  1970/07/15   DATE OF ADMISSION:  03/29/2005  DATE OF DISCHARGE:  03/31/2005                                 DISCHARGE SUMMARY   PRIMARY CARE PHYSICIAN:  [facility], therefore she is unassigned.   DISCHARGE DIAGNOSES:  1.  Periorbital cellulitis.  2.  Exophthalmos.   PROCEDURE:  CT scan of the orbits without contrast completed on March 28, 2005.   CONSULTATIONS:  With ENT, Dr. Jeannett Senior. Rosen.   HISTORY OF PRESENT ILLNESS:  Deborah [name] is a 40 year old female who arrived  with the chief complaint of right facial swelling and redness for four days.  She stated that she had been plucking her eyebrows on March 25, 2005, when  the right eyebrow started bleeding.  She applied some Neosporin daily to  that region, and on March 26, 2005, she noted a puffed-up appearance on  the right side of her face.  This was followed by itching and redness.  Her  symptoms progressed and by March 27, 2005, she found it difficult to open  her right eye.  She subsequently was brought to the hospital for further  evaluation.   PAST MEDICAL HISTORY:  No significant past medical history.   FAMILY HISTORY:  Unremarkable.   SOCIAL HISTORY:  The patient smokes approximately two to three cigarettes  per day.  Drinks alcohol occasionally.   HOSPITAL COURSE:  #1 - PERIORBITAL CELLULITIS:  The patient was admitted  into the hospital for further evaluation.  She was started on vancomycin 1  gram q.12h., prednisone 20 mg p.o. b.i.d. and Benadryl 25 mg q.6h.  A CT  scan of the orbits was completed on March 28, 2005, the final impression  of which was right facial pre-septal soft tissue swelling without focal  collection or underlying bony abnormality.  No post-septal or  intracranial  abnormality.  ENT was consulted and Dr. Pollyann Kennedy was the physician who  responded to the consultation.  His impression was that the patient probably  had an allergy to Neosporin.  The other possibility was Staphylococcus  infection or possibly methicillin-resistant Staphylococcus aureus.  He  stated that she was already on the appropriate antibiotic coverage, and that  the patient had no evidence of abscess or any surgical disease.  Therefore  he recommended a follow-up evaluation on a p.r.n. basis.  The patient also  had blood cultures completed. the final impression of which were negative on  October 23rd and October 24th respectively.  Over the course of her  hospitalization the patient states that her symptoms have improved.  Her  facial swelling has decreased along with the erythema.  Likewise there is no  compromise of her vision and her eye is completely open.   #2 - EXOPHTHALMOS:  The patient had a T4 completed which was found to be 8.4  which is within normal range.  Likewise a TSH  was also done and was found to  be 1.617.   CONDITION ON DISCHARGE:  Improved.  Today the patient states that she feels  much better and states that she would like very much to go home today.   VITAL SIGNS ON DISCHARGE:  Temperature 98.3 degrees, heart rate 68,  respirations 18, blood pressure 128/70, O2 saturation 99% on room air.  HEENT:  The patient's right face appears to be still slightly erythematous;  however, not significantly swollen.  Likewise her eye is completely opened.  There is no other visible finding of distress.   DISPOSITION:  Therefore the decision has been made to discharge the patient  home.   DISCHARGE MEDICATIONS:  1.  Keflex 500 mg, one tab q.12h.  2.  Motrin 600 mg, one tab t.i.d. p.r.n. pain.  3.  Protonix 40 mg p.o. daily.  4.  Loratadine 10 mg, one tab p.o. daily.   FOLLOWUP:  The patient has been instructed to follow up with her primary  care physician  within the next two to three weeks for further evaluation.      Michaelyn Barter, M.D.  Electronically Signed     OR/MEDQ  D:  03/31/2005  T:  04/01/2005  Job:  [id number]

## 2010-11-22 ENCOUNTER — Ambulatory Visit: Payer: Self-pay | Admitting: *Deleted

## 2010-11-24 ENCOUNTER — Ambulatory Visit: Payer: Self-pay | Admitting: *Deleted

## 2011-07-08 ENCOUNTER — Encounter (HOSPITAL_BASED_OUTPATIENT_CLINIC_OR_DEPARTMENT_OTHER): Payer: Self-pay | Admitting: Family Medicine

## 2011-07-08 ENCOUNTER — Emergency Department (HOSPITAL_BASED_OUTPATIENT_CLINIC_OR_DEPARTMENT_OTHER)
Admission: EM | Admit: 2011-07-08 | Discharge: 2011-07-08 | Disposition: A | Discharge status: Home / Self Care | Payer: Self-pay | Attending: Emergency Medicine | Admitting: Emergency Medicine

## 2011-07-08 DIAGNOSIS — IMO0002 Reserved for concepts with insufficient information to code with codable children: Secondary | ICD-10-CM

## 2011-07-08 DIAGNOSIS — E119 Type 2 diabetes mellitus without complications: Secondary | ICD-10-CM | POA: Insufficient documentation

## 2011-07-08 DIAGNOSIS — K219 Gastro-esophageal reflux disease without esophagitis: Secondary | ICD-10-CM | POA: Insufficient documentation

## 2011-07-08 DIAGNOSIS — L03019 Cellulitis of unspecified finger: Secondary | ICD-10-CM | POA: Insufficient documentation

## 2011-07-08 DIAGNOSIS — F17200 Nicotine dependence, unspecified, uncomplicated: Secondary | ICD-10-CM | POA: Insufficient documentation

## 2011-07-08 DIAGNOSIS — I10 Essential (primary) hypertension: Secondary | ICD-10-CM | POA: Insufficient documentation

## 2011-07-08 HISTORY — DX: Essential (primary) hypertension: I10

## 2011-07-08 HISTORY — DX: Gastro-esophageal reflux disease without esophagitis: K21.9

## 2011-07-08 MED ORDER — HYDROCODONE-ACETAMINOPHEN 5-325 MG PO TABS
1.0000 | ORAL_TABLET | Freq: Once | ORAL | Status: AC
  Administered 2011-07-08: 1 via ORAL
  Filled 2011-07-08: qty 1

## 2011-07-08 MED ORDER — HYDROCODONE-ACETAMINOPHEN 5-500 MG PO TABS: 1.0000 | ORAL_TABLET | Freq: Four times a day (QID) | ORAL | Status: AC | PRN

## 2011-07-08 MED ORDER — SULFAMETHOXAZOLE-TRIMETHOPRIM 800-160 MG PO TABS: 1.0000 | ORAL_TABLET | Freq: Two times a day (BID) | ORAL | Status: AC

## 2011-07-08 NOTE — ED Notes (Signed)
Pt sts she had a hangnail on Sunday and pulled it off. Pt c/o right index finger pain and "infected".

## 2011-07-08 NOTE — ED Provider Notes (Signed)
History     CSN: [id number]  Arrival date & time 07/08/11  1428   First MD Initiated Contact with Patient 07/08/11 1545      Chief Complaint  Patient presents with  . Finger Injury    (Consider location/radiation/quality/duration/timing/severity/associated sxs/prior treatment) HPI Comments: Pt states that she pulled a hangnail and now her finger is swollen and painful  Patient is a 41 y.o. female presenting with hand pain. The history is provided by the patient. No language interpreter was used.  Hand Pain This is a new problem. The current episode started in the past 7 days. The problem occurs constantly. The problem has been gradually worsening. Exacerbated by: palpation. She has tried nothing for the symptoms.    Past Medical History  Diagnosis Date  . Diabetes mellitus   . Hypertension   . GERD (gastroesophageal reflux disease)   . Gout     Past Surgical History  Procedure Date  . Hemorrhoid surgery     No family history on file.  History  Substance Use Topics  . Smoking status: Current Everyday Smoker  . Smokeless tobacco: Not on file  . Alcohol Use: Yes    OB History    Grav Para Term Preterm Abortions TAB SAB Ect Mult Living                  Review of Systems  Constitutional: Negative.   Respiratory: Negative.   Cardiovascular: Negative.   Skin: Positive for wound.    Allergies  Adhesive; Cephalexin; and Triple antibiotic  Home Medications   Current Outpatient Rx  Name Route Sig Dispense Refill  . HYDROCODONE-ACETAMINOPHEN 5-500 MG PO TABS Oral Take 1-2 tablets by mouth every 6 (six) hours as needed for pain. 10 tablet 0  . SULFAMETHOXAZOLE-TRIMETHOPRIM 800-160 MG PO TABS Oral Take 1 tablet by mouth every 12 (twelve) hours. 10 tablet 0    BP 169/89  Pulse 91  Temp(Src) 98.4 F (36.9 C) (Oral)  Resp 16  SpO2 100%  LMP 07/03/2011  Physical Exam  Nursing note and vitals reviewed. Constitutional: She is oriented to person, place, and  time. She appears well-developed and well-nourished.  Cardiovascular: Normal rate and regular rhythm.   Pulmonary/Chest: Effort normal and breath sounds normal.  Musculoskeletal:       No swelling noted to the pad of right index finger  Neurological: She is alert and oriented to person, place, and time.  Skin:       Pt has swelling noted to the medial aspect of the right index finger nail with some fluctuance noted  Psychiatric: She has a normal mood and affect.    ED Course  Drain paronychia Performed by: Teressa Lower Authorized by: Teressa Lower Consent: Verbal consent obtained. Written consent not obtained. Risks and benefits: risks, benefits and alternatives were discussed Consent given by: patient Patient identity confirmed: verbally with patient Time out: Immediately prior to procedure a "time out" was called to verify the correct patient, procedure, equipment, support staff and site/side marked as required. Local anesthesia used: no Patient sedated: no Patient tolerance: Patient tolerated the procedure well with no immediate complications. Comments: Drained with 18 gauge needle   (including critical care time)  Labs Reviewed - No data to display No results found.   1. Paronychia       MDM  Pt tolerated procedure fine:no sign of felon at this time        Teressa Lower, NP 07/08/11 1710

## 2011-07-14 NOTE — ED Provider Notes (Signed)
Medical screening examination/treatment/procedure(s) were performed by non-physician practitioner and as supervising physician I was immediately available for consultation/collaboration.  Raeford Razor, MD 07/14/11 [phone]

## 2011-07-21 ENCOUNTER — Other Ambulatory Visit: Payer: Self-pay | Admitting: Internal Medicine

## 2011-07-21 DIAGNOSIS — Z1231 Encounter for screening mammogram for malignant neoplasm of breast: Secondary | ICD-10-CM

## 2011-08-16 ENCOUNTER — Ambulatory Visit (HOSPITAL_COMMUNITY): Payer: Self-pay

## 2011-09-07 ENCOUNTER — Ambulatory Visit (HOSPITAL_COMMUNITY)
Admission: RE | Admit: 2011-09-07 | Discharge: 2011-09-07 | Disposition: A | Discharge status: Home / Self Care | Payer: Self-pay | Source: Ambulatory Visit | Attending: Internal Medicine | Admitting: Internal Medicine

## 2011-09-07 DIAGNOSIS — Z1231 Encounter for screening mammogram for malignant neoplasm of breast: Secondary | ICD-10-CM | POA: Insufficient documentation

## 2011-11-23 ENCOUNTER — Encounter (HOSPITAL_COMMUNITY): Payer: Self-pay

## 2011-11-23 ENCOUNTER — Emergency Department (HOSPITAL_COMMUNITY)
Admission: EM | Admit: 2011-11-23 | Discharge: 2011-11-23 | Disposition: A | Discharge status: Home / Self Care | Payer: Self-pay | Source: Home / Self Care | Attending: Emergency Medicine | Admitting: Emergency Medicine

## 2011-11-23 DIAGNOSIS — J069 Acute upper respiratory infection, unspecified: Secondary | ICD-10-CM

## 2011-11-23 DIAGNOSIS — J209 Acute bronchitis, unspecified: Secondary | ICD-10-CM

## 2011-11-23 DIAGNOSIS — J019 Acute sinusitis, unspecified: Secondary | ICD-10-CM

## 2011-11-23 HISTORY — DX: Dermatitis, unspecified: L30.9

## 2011-11-23 HISTORY — DX: Other seasonal allergic rhinitis: J30.2

## 2011-11-23 MED ORDER — ALBUTEROL SULFATE HFA 108 (90 BASE) MCG/ACT IN AERS: 1.0000 | INHALATION_SPRAY | Freq: Four times a day (QID) | RESPIRATORY_TRACT | Status: DC | PRN

## 2011-11-23 MED ORDER — TRAMADOL HCL 50 MG PO TABS: 100.0000 mg | ORAL_TABLET | Freq: Three times a day (TID) | ORAL | Status: AC | PRN

## 2011-11-23 MED ORDER — AZITHROMYCIN 250 MG PO TABS: ORAL_TABLET | ORAL | Status: AC

## 2011-11-23 MED ORDER — BENZONATATE 200 MG PO CAPS: 200.0000 mg | ORAL_CAPSULE | Freq: Three times a day (TID) | ORAL | Status: AC | PRN

## 2011-11-23 NOTE — ED Provider Notes (Signed)
Chief Complaint  Patient presents with  . Cough  . Facial Pain    History of Present Illness:   The patient is a 41 year old female with a five-day history of cough productive of clear to yellow to green sputum, nasal congestion with yellow drainage, headache, facial pain, hoarseness, chest pain when she coughs, chills, and sore throat. She denies any fever, abdominal pain, nausea, vomiting, or diarrhea.  Review of Systems:  Other than noted above, the patient denies any of the following symptoms. Systemic:  No fever, chills, sweats, fatigue, myalgias, headache, or anorexia. Eye:  No redness, pain or drainage. ENT:  No earache, ear congestion, nasal congestion, sneezing, rhinorrhea, sinus pressure, sinus pain, post nasal drip, or sore throat. Lungs:  No cough, sputum production, wheezing, shortness of breath, or chest pain. GI:  No abdominal pain, nausea, vomiting, or diarrhea. Skin:  No rash or itching.  PMFSH:  Past medical history, family history, social history, meds, and allergies were reviewed.  Physical Exam:   Vital signs:  BP 182/95  Pulse 89  Temp 99.6 F (37.6 C) (Oral)  Resp 20  SpO2 99%  LMP 11/21/2011 General:  Alert, in no distress. Eye:  No conjunctival injection or drainage. Lids were normal. ENT:  TMs and canals were normal, without erythema or inflammation.  Nasal mucosa was congested with some yellowish drainage on the right.  Mucous membranes were moist.  Pharynx was clear, without exudate or drainage.  There were no oral ulcerations or lesions. Neck:  Supple, no adenopathy, tenderness or mass. Lungs:  No respiratory distress.  Lungs were clear to auscultation, without wheezes, rales or rhonchi.  Breath sounds were clear and equal bilaterally. Lungs were resonant to percussion.  No egophony. Heart:  Regular rhythm, without gallops, murmers or rubs. Skin:  Clear, warm, and dry, without rash or lesions.  Assessment:  The primary encounter diagnosis was Acute  sinusitis. Diagnoses of Acute bronchitis and Viral upper respiratory infection were also pertinent to this visit.  Plan:   1.  The following meds were prescribed:   New Prescriptions   ALBUTEROL (PROVENTIL HFA;VENTOLIN HFA) 108 (90 BASE) MCG/ACT INHALER    Inhale 1-2 puffs into the lungs every 6 (six) hours as needed for wheezing.   AZITHROMYCIN (ZITHROMAX Z-PAK) 250 MG TABLET    Take as directed.   BENZONATATE (TESSALON) 200 MG CAPSULE    Take 1 capsule (200 mg total) by mouth 3 (three) times daily as needed for cough.   TRAMADOL (ULTRAM) 50 MG TABLET    Take 2 tablets (100 mg total) by mouth every 8 (eight) hours as needed for pain.   2.  The patient was instructed in symptomatic care and handouts were given. 3.  The patient was told to return if becoming worse in any way, if no better in 3 or 4 days, and given some red flag symptoms that would indicate earlier return.   Reuben Likes, MD 11/23/11 [phone]

## 2011-11-23 NOTE — ED Notes (Signed)
C/o productive cough of yellow sputum, sinus congestion, headaches and voice hoarse since this weekend.

## 2011-11-23 NOTE — Discharge Instructions (Signed)

## 2012-01-21 ENCOUNTER — Encounter (HOSPITAL_COMMUNITY): Payer: Self-pay | Admitting: *Deleted

## 2012-01-21 ENCOUNTER — Emergency Department (HOSPITAL_COMMUNITY)
Admission: EM | Admit: 2012-01-21 | Discharge: 2012-01-21 | Disposition: A | Discharge status: Home / Self Care | Payer: Self-pay | Attending: Emergency Medicine | Admitting: Emergency Medicine

## 2012-01-21 DIAGNOSIS — E119 Type 2 diabetes mellitus without complications: Secondary | ICD-10-CM | POA: Insufficient documentation

## 2012-01-21 DIAGNOSIS — K219 Gastro-esophageal reflux disease without esophagitis: Secondary | ICD-10-CM | POA: Insufficient documentation

## 2012-01-21 DIAGNOSIS — I10 Essential (primary) hypertension: Secondary | ICD-10-CM | POA: Insufficient documentation

## 2012-01-21 DIAGNOSIS — Z79899 Other long term (current) drug therapy: Secondary | ICD-10-CM | POA: Insufficient documentation

## 2012-01-21 DIAGNOSIS — F17200 Nicotine dependence, unspecified, uncomplicated: Secondary | ICD-10-CM | POA: Insufficient documentation

## 2012-01-21 DIAGNOSIS — L03019 Cellulitis of unspecified finger: Secondary | ICD-10-CM | POA: Insufficient documentation

## 2012-01-21 DIAGNOSIS — IMO0002 Reserved for concepts with insufficient information to code with codable children: Secondary | ICD-10-CM

## 2012-01-21 MED ORDER — LISINOPRIL 10 MG PO TABS: 10.0000 mg | ORAL_TABLET | Freq: Every day | ORAL | Status: DC

## 2012-01-21 MED ORDER — HYDROCODONE-ACETAMINOPHEN 5-325 MG PO TABS
1.0000 | ORAL_TABLET | Freq: Once | ORAL | Status: AC
  Administered 2012-01-21: 1 via ORAL

## 2012-01-21 MED ORDER — HYDROCODONE-ACETAMINOPHEN 5-325 MG PO TABS: 1.0000 | ORAL_TABLET | Freq: Four times a day (QID) | ORAL | Status: AC | PRN

## 2012-01-21 MED ORDER — HYDROCODONE-ACETAMINOPHEN 5-325 MG PO TABS
ORAL_TABLET | ORAL | Status: AC
  Filled 2012-01-21: qty 1

## 2012-01-21 NOTE — ED Notes (Signed)
Reports having hang nail to right middle finger, is causing throbbing pain. No acute distress noted at triage.

## 2012-01-25 NOTE — ED Provider Notes (Signed)
History     CSN: [id number]  Arrival date & time 01/21/12  7829   First MD Initiated Contact with Patient 01/21/12 1038      Chief Complaint  Patient presents with  . Finger Injury    (Consider location/radiation/quality/duration/timing/severity/associated sxs/prior treatment) HPI The patient pulled a hang nail 2 days ago and noted swelling and pain along the side of her R middle finger. The patient denies any treatment prior to arrival. The patient states that she did keep the area cleansed. The patient did try motrin for pain with some relief. Patient denies fever, nausea or vomiting. Past Medical History  Diagnosis Date  . Diabetes mellitus   . Hypertension   . GERD (gastroesophageal reflux disease)   . Gout   . Seasonal allergies   . Eczema     Past Surgical History  Procedure Date  . Hemorrhoid surgery     History reviewed. No pertinent family history.  History  Substance Use Topics  . Smoking status: Current Everyday Smoker -- 0.5 packs/day  . Smokeless tobacco: Not on file  . Alcohol Use: Yes    OB History    Grav Para Term Preterm Abortions TAB SAB Ect Mult Living                  Review of Systems All other systems negative except as documented in the HPI. All pertinent positives and negatives as reviewed in the HPI.  Allergies  Adhesive; Cephalexin; and Neomycin-bacitracin zn-polymyx  Home Medications   Current Outpatient Rx  Name Route Sig Dispense Refill  . INDOMETHACIN 25 MG PO CAPS Oral Take 25 mg by mouth 2 (two) times daily as needed. For gout    . LISINOPRIL PO Oral Take 1 tablet by mouth daily.     Marland Kitchen LORATADINE 10 MG PO TABS Oral Take 10 mg by mouth daily as needed. For allergies    . METFORMIN HCL 500 MG PO TABS Oral Take 500 mg by mouth 2 (two) times daily with a meal.    . MONTELUKAST SODIUM 10 MG PO TABS Oral Take 10 mg by mouth daily as needed. For allergies    . PRESCRIPTION MEDICATION Oral Take 1 capsule by mouth daily. Acid  reflux med    . HYDROCODONE-ACETAMINOPHEN 5-325 MG PO TABS Oral Take 1 tablet by mouth every 6 (six) hours as needed for pain. 10 tablet 0  . LISINOPRIL 10 MG PO TABS Oral Take 1 tablet (10 mg total) by mouth daily. 30 tablet 1    BP 150/100  Pulse 101  Temp 97.5 F (36.4 C) (Oral)  Resp 20  SpO2 98%  LMP 01/14/2012  Physical Exam  Nursing note and vitals reviewed. Constitutional: She appears well-developed and well-nourished. No distress.  Musculoskeletal:       Right hand: She exhibits tenderness. She exhibits normal range of motion. normal sensation noted. Normal strength noted.       Hands: Skin: Skin is warm and dry.    ED Course  Procedures (including critical care time)  Labs Reviewed - No data to display No results found.   1. Paronychia    INCISION AND DRAINAGE Performed by: Carlyle Dolly Consent: Verbal consent obtained. Risks and benefits: risks, benefits and alternatives were discussed Type: abscess  Body area: R middle finger. Paronychia  Anesthesia: local infiltration  Local anesthetic: lidocaine 2% wo epinephrine  Anesthetic total: 4 ml  Complexity: complex   Drainage: purulent  Drainage amount: small  Packing material:none  Patient tolerance: Patient tolerated the procedure well with no immediate complications.       MDM          Carlyle Dolly, PA-C 01/25/12 [phone]

## 2012-01-27 NOTE — ED Provider Notes (Signed)
Medical screening examination/treatment/procedure(s) were performed by non-physician practitioner and as supervising physician I was immediately available for consultation/collaboration.  Toy Baker, MD 01/27/12 [phone]

## 2012-06-10 ENCOUNTER — Emergency Department (HOSPITAL_COMMUNITY): Payer: Self-pay

## 2012-06-10 ENCOUNTER — Encounter (HOSPITAL_COMMUNITY): Payer: Self-pay | Admitting: Emergency Medicine

## 2012-06-10 ENCOUNTER — Emergency Department (HOSPITAL_COMMUNITY)
Admission: EM | Admit: 2012-06-10 | Discharge: 2012-06-10 | Disposition: A | Discharge status: Home / Self Care | Payer: Self-pay | Attending: Emergency Medicine | Admitting: Emergency Medicine

## 2012-06-10 DIAGNOSIS — Z79899 Other long term (current) drug therapy: Secondary | ICD-10-CM | POA: Insufficient documentation

## 2012-06-10 DIAGNOSIS — X58XXXA Exposure to other specified factors, initial encounter: Secondary | ICD-10-CM | POA: Insufficient documentation

## 2012-06-10 DIAGNOSIS — I10 Essential (primary) hypertension: Secondary | ICD-10-CM | POA: Insufficient documentation

## 2012-06-10 DIAGNOSIS — M109 Gout, unspecified: Secondary | ICD-10-CM | POA: Insufficient documentation

## 2012-06-10 DIAGNOSIS — Y929 Unspecified place or not applicable: Secondary | ICD-10-CM | POA: Insufficient documentation

## 2012-06-10 DIAGNOSIS — J309 Allergic rhinitis, unspecified: Secondary | ICD-10-CM | POA: Insufficient documentation

## 2012-06-10 DIAGNOSIS — Y939 Activity, unspecified: Secondary | ICD-10-CM | POA: Insufficient documentation

## 2012-06-10 DIAGNOSIS — F17200 Nicotine dependence, unspecified, uncomplicated: Secondary | ICD-10-CM | POA: Insufficient documentation

## 2012-06-10 DIAGNOSIS — E119 Type 2 diabetes mellitus without complications: Secondary | ICD-10-CM | POA: Insufficient documentation

## 2012-06-10 DIAGNOSIS — T148XXA Other injury of unspecified body region, initial encounter: Secondary | ICD-10-CM

## 2012-06-10 DIAGNOSIS — K219 Gastro-esophageal reflux disease without esophagitis: Secondary | ICD-10-CM | POA: Insufficient documentation

## 2012-06-10 DIAGNOSIS — R220 Localized swelling, mass and lump, head: Secondary | ICD-10-CM | POA: Insufficient documentation

## 2012-06-10 DIAGNOSIS — IMO0002 Reserved for concepts with insufficient information to code with codable children: Secondary | ICD-10-CM | POA: Insufficient documentation

## 2012-06-10 DIAGNOSIS — Z3202 Encounter for pregnancy test, result negative: Secondary | ICD-10-CM | POA: Insufficient documentation

## 2012-06-10 DIAGNOSIS — Z872 Personal history of diseases of the skin and subcutaneous tissue: Secondary | ICD-10-CM | POA: Insufficient documentation

## 2012-06-10 LAB — GLUCOSE, CAPILLARY

## 2012-06-10 LAB — POCT PREGNANCY, URINE: Preg Test, Ur: NEGATIVE

## 2012-06-10 MED ORDER — OXYCODONE-ACETAMINOPHEN 5-325 MG PO TABS
2.0000 | ORAL_TABLET | Freq: Once | ORAL | Status: AC
  Administered 2012-06-10: 2 via ORAL
  Filled 2012-06-10: qty 2

## 2012-06-10 NOTE — ED Notes (Signed)
Patient returned from CT

## 2012-06-10 NOTE — ED Notes (Signed)
Pts R side of face cleaned w/ normal saline and guaze.
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
## 2012-06-13 NOTE — ED Provider Notes (Signed)
Medical screening examination/treatment/procedure(s) were performed by non-physician practitioner and as supervising physician I was immediately available for consultation/collaboration.  Derwood Kaplan, MD 06/13/12 [phone]

## 2012-12-02 ENCOUNTER — Encounter (HOSPITAL_COMMUNITY): Payer: Self-pay | Admitting: Emergency Medicine

## 2012-12-02 ENCOUNTER — Emergency Department (HOSPITAL_COMMUNITY)
Admission: EM | Admit: 2012-12-02 | Discharge: 2012-12-02 | Disposition: A | Discharge status: Home / Self Care | Payer: Self-pay | Attending: Emergency Medicine | Admitting: Emergency Medicine

## 2012-12-02 DIAGNOSIS — Z872 Personal history of diseases of the skin and subcutaneous tissue: Secondary | ICD-10-CM | POA: Insufficient documentation

## 2012-12-02 DIAGNOSIS — M79609 Pain in unspecified limb: Secondary | ICD-10-CM | POA: Insufficient documentation

## 2012-12-02 DIAGNOSIS — F17200 Nicotine dependence, unspecified, uncomplicated: Secondary | ICD-10-CM | POA: Insufficient documentation

## 2012-12-02 DIAGNOSIS — Z79899 Other long term (current) drug therapy: Secondary | ICD-10-CM | POA: Insufficient documentation

## 2012-12-02 DIAGNOSIS — K219 Gastro-esophageal reflux disease without esophagitis: Secondary | ICD-10-CM | POA: Insufficient documentation

## 2012-12-02 DIAGNOSIS — M109 Gout, unspecified: Secondary | ICD-10-CM

## 2012-12-02 DIAGNOSIS — I10 Essential (primary) hypertension: Secondary | ICD-10-CM | POA: Insufficient documentation

## 2012-12-02 DIAGNOSIS — R51 Headache: Secondary | ICD-10-CM | POA: Insufficient documentation

## 2012-12-02 DIAGNOSIS — E119 Type 2 diabetes mellitus without complications: Secondary | ICD-10-CM | POA: Insufficient documentation

## 2012-12-02 MED ORDER — OXYCODONE-ACETAMINOPHEN 5-325 MG PO TABS: 2.0000 | ORAL_TABLET | Freq: Four times a day (QID) | ORAL | Status: DC | PRN

## 2012-12-02 MED ORDER — OXYCODONE-ACETAMINOPHEN 5-325 MG PO TABS
2.0000 | ORAL_TABLET | Freq: Once | ORAL | Status: AC
  Administered 2012-12-02: 2 via ORAL
  Filled 2012-12-02: qty 2

## 2012-12-02 MED ORDER — PROMETHAZINE HCL 25 MG PO TABS: 25.0000 mg | ORAL_TABLET | Freq: Four times a day (QID) | ORAL | Status: DC | PRN

## 2012-12-02 MED ORDER — ONDANSETRON 8 MG PO TBDP
8.0000 mg | ORAL_TABLET | Freq: Once | ORAL | Status: AC
  Administered 2012-12-02: 8 mg via ORAL
  Filled 2012-12-02: qty 1

## 2012-12-02 MED ORDER — DEXAMETHASONE SODIUM PHOSPHATE 10 MG/ML IJ SOLN
10.0000 mg | Freq: Once | INTRAMUSCULAR | Status: AC
  Administered 2012-12-02: 10 mg via INTRAMUSCULAR
  Filled 2012-12-02: qty 1

## 2012-12-02 NOTE — ED Notes (Signed)
Pt from home c/o L knee and toe pain. Pt reports a hx of gout and indomethacin and tramadol is not helping her pain. Pt L knee is visibly swollen and warm to the touch. Pt has good movement, color in LLE. Pt A&O and in NAD

## 2012-12-02 NOTE — ED Provider Notes (Signed)
History    This chart was scribed for non-physician practitioner, Mora Bellman, PA-C, working with Hilario Quarry, MD by Melba Coon, ED Scribe. This patient was seen in room WTR5/WTR5 and the patient's care was started at 6:57PM.  CSN: [id number] Arrival date & time 12/02/12  1825  None    Chief Complaint  Patient presents with  . Knee Pain  . Toe Pain   (Consider location/radiation/quality/duration/timing/severity/associated sxs/prior Treatment) The history is provided by the patient. No language interpreter was used.  HPI Comments: Deborah [name] is a 42 y.o. female who presents to the Emergency Department complaining of constant, moderate left knee pain with swelling and pain at the ball of her left foot with an onset 2 days ago that has gotten progressively worse. Irelynn has a history of gout. She reports that the present pain feels similar to her typical gout pain. She denies any trauma or injuries. Ambulation is decreased compared to baseline secondary to pain. Indomethacin and tramadol are not alleviating the pain. She reports her usual flares of gout last for about 3 days. She reports she has a headaches which she normally gets when she gets gout. No other pertinent medical symptoms.  Past Medical History  Diagnosis Date  . Diabetes mellitus   . Hypertension   . GERD (gastroesophageal reflux disease)   . Gout   . Seasonal allergies   . Eczema    Past Surgical History  Procedure Laterality Date  . Hemorrhoid surgery     Family History  Problem Relation Age of Onset  . Hypertension Father   . Diabetes Father   . Hypertension Other   . Diabetes Other    History  Substance Use Topics  . Smoking status: Current Every Day Smoker -- 0.50 packs/day  . Smokeless tobacco: Not on file  . Alcohol Use: Yes   OB History   Grav Para Term Preterm Abortions TAB SAB Ect Mult Living                 Review of Systems  Constitutional: Negative for chills, appetite  change and fatigue.  HENT: Negative for congestion, sinus pressure and ear discharge.   Eyes: Negative for discharge.  Respiratory: Negative for cough.   Cardiovascular: Negative for chest pain.  Gastrointestinal: Negative for nausea, vomiting, abdominal pain and diarrhea.  Genitourinary: Negative for frequency and hematuria.  Musculoskeletal: Positive for joint swelling. Negative for back pain.  Skin: Negative for rash.  Neurological: Positive for headaches. Negative for seizures.  Psychiatric/Behavioral: Negative for hallucinations.  All other systems reviewed and are negative.    Allergies  Adhesive; Cephalexin; and Neomycin-bacitracin zn-polymyx  Home Medications   Current Outpatient Rx  Name  Route  Sig  Dispense  Refill  . albuterol (PROVENTIL HFA;VENTOLIN HFA) 108 (90 BASE) MCG/ACT inhaler   Inhalation   Inhale 2 puffs into the lungs every 6 (six) hours as needed. Shortness of breath         . ATENOLOL PO   Oral   Take 25 mg by mouth daily.         Marland Kitchen esomeprazole (NEXIUM) 40 MG capsule   Oral   Take 40 mg by mouth daily before breakfast.         . indomethacin (INDOCIN) 25 MG capsule   Oral   Take 25 mg by mouth 2 (two) times daily as needed. For gout         . lisinopril (PRINIVIL,ZESTRIL) 40 MG tablet  Oral   Take 40 mg by mouth daily.         . traMADol (ULTRAM) 50 MG tablet   Oral   Take 50 mg by mouth every 6 (six) hours as needed for pain.          BP 169/89  Pulse 78  Temp(Src) 99.3 F (37.4 C) (Oral)  Resp 20  SpO2 98%  LMP 11/06/2012 Physical Exam  Nursing note and vitals reviewed. Constitutional: She is oriented to person, place, and time. She appears well-developed and well-nourished. No distress.  HENT:  Head: Normocephalic and atraumatic.  Right Ear: External ear normal.  Left Ear: External ear normal.  Nose: Nose normal.  Mouth/Throat: Oropharynx is clear and moist.  Eyes: Conjunctivae are normal.  Neck: Normal range of  motion.  Cardiovascular: Normal rate, regular rhythm and normal heart sounds.   Pulmonary/Chest: Effort normal and breath sounds normal. No stridor. No respiratory distress. She has no wheezes. She has no rales.  Abdominal: Soft. She exhibits no distension.  Musculoskeletal: Normal range of motion. She exhibits edema and tenderness.  Very swollen, mildly erythematous, painful left knee without erythema. ROM of the knee is limited secondary to pain. NV intact.  Neurological: She is alert and oriented to person, place, and time. She has normal strength.  Skin: Skin is warm and dry. She is not diaphoretic. No erythema.  Psychiatric: She has a normal mood and affect. Her behavior is normal.    ED Course  Procedures (including critical care time)  COORDINATION OF CARE:  7:01PM - Decadron and percocet will be ordered for Annette Stable. Work note given for tomorrow.  Labs Reviewed - No data to display  No results found.  1. Gout     MDM  Pt presents with monoarticular pain, swelling and erythema.  Pt is afebrile and stable. Patient has indomethicin at home which she states has been ineffective. Rx for pain medication. Decadron given in ED. Discussed that pt should respond to treatment with in 24 hour of begining treatment & likely resolve in 2-3 days. Return instructions discussed. Vital signs stable for discharge. Patient / Family / Caregiver informed of clinical course, understand medical decision-making process, and agree with plan.    I personally performed the services described in this documentation, which was scribed in my presence. The recorded information has been reviewed and is accurate.    Mora Bellman, PA-C 12/02/12 2029

## 2012-12-04 NOTE — ED Provider Notes (Signed)
History/physical exam/procedure(s) were performed by non-physician practitioner and as supervising physician I was immediately available for consultation/collaboration. I have reviewed all notes and am in agreement with care and plan.   Hilario Quarry, MD 12/04/12 [phone]

## 2013-09-23 ENCOUNTER — Encounter (HOSPITAL_COMMUNITY): Payer: Self-pay | Admitting: Emergency Medicine

## 2013-09-23 ENCOUNTER — Emergency Department (HOSPITAL_COMMUNITY)
Admission: EM | Admit: 2013-09-23 | Discharge: 2013-09-23 | Disposition: A | Discharge status: Home / Self Care | Payer: No Typology Code available for payment source | Attending: Emergency Medicine | Admitting: Emergency Medicine

## 2013-09-23 ENCOUNTER — Emergency Department (HOSPITAL_COMMUNITY): Payer: No Typology Code available for payment source

## 2013-09-23 DIAGNOSIS — E119 Type 2 diabetes mellitus without complications: Secondary | ICD-10-CM | POA: Insufficient documentation

## 2013-09-23 DIAGNOSIS — I10 Essential (primary) hypertension: Secondary | ICD-10-CM | POA: Insufficient documentation

## 2013-09-23 DIAGNOSIS — M109 Gout, unspecified: Secondary | ICD-10-CM | POA: Insufficient documentation

## 2013-09-23 DIAGNOSIS — Y9389 Activity, other specified: Secondary | ICD-10-CM | POA: Insufficient documentation

## 2013-09-23 DIAGNOSIS — IMO0002 Reserved for concepts with insufficient information to code with codable children: Secondary | ICD-10-CM | POA: Insufficient documentation

## 2013-09-23 DIAGNOSIS — M542 Cervicalgia: Secondary | ICD-10-CM

## 2013-09-23 DIAGNOSIS — Y92410 Unspecified street and highway as the place of occurrence of the external cause: Secondary | ICD-10-CM | POA: Insufficient documentation

## 2013-09-23 DIAGNOSIS — S0993XA Unspecified injury of face, initial encounter: Secondary | ICD-10-CM | POA: Insufficient documentation

## 2013-09-23 DIAGNOSIS — Z872 Personal history of diseases of the skin and subcutaneous tissue: Secondary | ICD-10-CM | POA: Insufficient documentation

## 2013-09-23 DIAGNOSIS — M549 Dorsalgia, unspecified: Secondary | ICD-10-CM

## 2013-09-23 DIAGNOSIS — F17200 Nicotine dependence, unspecified, uncomplicated: Secondary | ICD-10-CM | POA: Insufficient documentation

## 2013-09-23 DIAGNOSIS — S199XXA Unspecified injury of neck, initial encounter: Principal | ICD-10-CM

## 2013-09-23 DIAGNOSIS — Z791 Long term (current) use of non-steroidal anti-inflammatories (NSAID): Secondary | ICD-10-CM | POA: Insufficient documentation

## 2013-09-23 DIAGNOSIS — Z79899 Other long term (current) drug therapy: Secondary | ICD-10-CM | POA: Insufficient documentation

## 2013-09-23 DIAGNOSIS — K219 Gastro-esophageal reflux disease without esophagitis: Secondary | ICD-10-CM | POA: Insufficient documentation

## 2013-09-23 MED ORDER — CYCLOBENZAPRINE HCL 10 MG PO TABS: 10.0000 mg | ORAL_TABLET | Freq: Two times a day (BID) | ORAL | Status: DC | PRN

## 2013-09-23 MED ORDER — KETOROLAC TROMETHAMINE 60 MG/2ML IM SOLN
60.0000 mg | Freq: Once | INTRAMUSCULAR | Status: AC
  Administered 2013-09-23: 60 mg via INTRAMUSCULAR
  Filled 2013-09-23: qty 2

## 2013-09-23 MED ORDER — NAPROXEN 500 MG PO TABS: 500.0000 mg | ORAL_TABLET | Freq: Two times a day (BID) | ORAL | Status: DC
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
## 2013-09-23 NOTE — Progress Notes (Signed)
[facility] Stacy, provided pt with a list of primary care resources and a [employer] application to help patient establish primary care.
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
## 2013-09-23 NOTE — Discharge Instructions (Signed)
X-rays were normal.  You'll be sore for several days.  Medication for pain and muscle spasm. °
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
## 2014-02-04 ENCOUNTER — Inpatient Hospital Stay (HOSPITAL_COMMUNITY)
Admission: AD | Admit: 2014-02-04 | Discharge: 2014-02-04 | Disposition: A | Discharge status: Home / Self Care | Payer: Self-pay | Source: Ambulatory Visit | Attending: Family Medicine | Admitting: Family Medicine

## 2014-02-04 ENCOUNTER — Encounter (HOSPITAL_COMMUNITY): Payer: Self-pay | Admitting: *Deleted

## 2014-02-04 DIAGNOSIS — L293 Anogenital pruritus, unspecified: Secondary | ICD-10-CM | POA: Insufficient documentation

## 2014-02-04 DIAGNOSIS — N898 Other specified noninflammatory disorders of vagina: Secondary | ICD-10-CM | POA: Insufficient documentation

## 2014-02-04 LAB — URINALYSIS, ROUTINE W REFLEX MICROSCOPIC
Glucose, UA: NEGATIVE mg/dL
Hgb urine dipstick: NEGATIVE
Ketones, ur: 15 mg/dL — AB
Leukocytes, UA: NEGATIVE
NITRITE: NEGATIVE
PH: 6 (ref 5.0–8.0)
Protein, ur: NEGATIVE mg/dL
Specific Gravity, Urine: 1.02 (ref 1.005–1.030)
Urobilinogen, UA: 0.2 mg/dL (ref 0.0–1.0)

## 2014-02-04 LAB — WET PREP, GENITAL
TRICH WET PREP: NONE SEEN
Yeast Wet Prep HPF POC: NONE SEEN

## 2014-02-04 LAB — POCT PREGNANCY, URINE: Preg Test, Ur: NEGATIVE
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
## 2014-02-04 NOTE — Discharge Instructions (Signed)

## 2014-02-05 LAB — GC/CHLAMYDIA PROBE AMP
CT PROBE, AMP APTIMA: NEGATIVE
GC PROBE AMP APTIMA: NEGATIVE

## 2014-09-12 ENCOUNTER — Emergency Department (INDEPENDENT_AMBULATORY_CARE_PROVIDER_SITE_OTHER)
Admission: EM | Admit: 2014-09-12 | Discharge: 2014-09-12 | Disposition: A | Discharge status: Home / Self Care | Payer: Self-pay | Source: Home / Self Care | Attending: Family Medicine | Admitting: Family Medicine

## 2014-09-12 ENCOUNTER — Encounter (HOSPITAL_COMMUNITY): Payer: Self-pay | Admitting: Emergency Medicine

## 2014-09-12 DIAGNOSIS — E1165 Type 2 diabetes mellitus with hyperglycemia: Secondary | ICD-10-CM

## 2014-09-12 DIAGNOSIS — IMO0002 Reserved for concepts with insufficient information to code with codable children: Secondary | ICD-10-CM

## 2014-09-12 DIAGNOSIS — I10 Essential (primary) hypertension: Secondary | ICD-10-CM

## 2014-09-12 LAB — POCT I-STAT, CHEM 8
BUN: 23 mg/dL (ref 6–23)
CREATININE: 0.7 mg/dL (ref 0.50–1.10)
Calcium, Ion: 1.21 mmol/L (ref 1.12–1.23)
Chloride: 100 mmol/L (ref 96–112)
GLUCOSE: 205 mg/dL — AB (ref 70–99)
HCT: 43 % (ref 36.0–46.0)
HEMOGLOBIN: 14.6 g/dL (ref 12.0–15.0)
Potassium: 3.9 mmol/L (ref 3.5–5.1)
SODIUM: 141 mmol/L (ref 135–145)
TCO2: 26 mmol/L (ref 0–100)

## 2014-09-12 NOTE — Discharge Instructions (Signed)
Stop catapres blood pressure medicine, your sugar today was 205 and needs further diabetes care. Call your doctor on mon to change medicines as needed.

## 2014-09-12 NOTE — ED Notes (Signed)
Patient reports her PCP has been trying to regulate her blood pressure x 2 months and she is currently taking a few new medications. Patient states she has had chills, hot flashes, involuntary shaking and episodes of weakness/near syncope. Patient is in NAD.

## 2014-09-12 NOTE — ED Provider Notes (Signed)
CSN: [id number]     Arrival date & time 09/12/14  1344 History   First MD Initiated Contact with Patient 09/12/14 1544     Chief Complaint  Patient presents with  . Near Syncope   (Consider location/radiation/quality/duration/timing/severity/associated sxs/prior Treatment) Patient is a 44 y.o. female presenting with near-syncope. The history is provided by the patient.  Near Syncope This is a new problem. 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 The problem has been gradually worsening. Pertinent negatives include no chest pain, no abdominal pain, no headaches and no shortness of breath.    Past Medical History  Diagnosis Date  . Diabetes mellitus   . Hypertension   . GERD (gastroesophageal reflux disease)   . Gout   . Seasonal allergies   . Eczema    Past Surgical History  Procedure Laterality Date  . Hemorrhoid surgery     Family History  Problem Relation Age of Onset  . Hypertension Father   . Diabetes Father   . Hypertension Other   . Diabetes Other    History  Substance Use Topics  . Smoking status: Former Smoker -- 0.50 packs/day  . Smokeless tobacco: Former Systems developer    Quit date: 03/14/2013  . Alcohol Use: No     Comment: none since 03-14-14   OB History    Gravida Para Term Preterm AB TAB SAB Ectopic Multiple Living   0         0     Review of Systems  Constitutional: Positive for chills and diaphoresis.  Respiratory: Negative.  Negative for shortness of breath.   Cardiovascular: Positive for near-syncope. Negative for chest pain, palpitations and leg swelling.  Gastrointestinal: Negative.  Negative for abdominal pain.  Neurological: Positive for weakness. Negative for headaches.    Allergies  Cephalexin; Hydrocodone; Neomycin-bacitracin zn-polymyx; Sertraline; and Adhesive  Home Medications   Prior to Admission medications    Medication Sig Start Date End Date Taking? Authorizing Provider  allopurinol (ZYLOPRIM) 100 MG tablet Take 100 mg by mouth daily.   Yes Historical Provider, MD  cloNIDine (CATAPRES) 0.1 MG tablet Take 0.1 mg by mouth 2 (two) times daily.   Yes Historical Provider, MD  atenolol (TENORMIN) 25 MG tablet Take 25 mg by mouth daily.    Historical Provider, MD  furosemide (LASIX) 20 MG tablet Take 20 mg by mouth daily.    Historical Provider, MD  hydrocortisone cream 1 % Apply 1 application topically 2 (two) times daily as needed for itching.    Historical Provider, MD  indomethacin (INDOCIN) 25 MG capsule Take 25 mg by mouth 2 (two) times daily with a meal. For gout    Historical Provider, MD  lisinopril (PRINIVIL,ZESTRIL) 40 MG tablet Take 40 mg by mouth daily.    Historical Provider, MD  loratadine (CLARITIN) 10 MG tablet Take 10 mg by mouth daily.    Historical Provider, MD  metFORMIN (GLUCOPHAGE) 500 MG tablet Take 500 mg by mouth 2 (two) times daily with a meal.    Historical Provider, MD  Multiple Vitamin (MULTIVITAMIN WITH MINERALS) TABS tablet Take 1 tablet by mouth daily. Only takes Monday through Friday    Historical Provider, MD  omeprazole (PRILOSEC) 40 MG capsule Take 40 mg by mouth daily.    Historical Provider, MD   BP 134/68 mmHg  Pulse 73  Temp(Src) 98  F (36.7 C) (Oral)  Resp 18  SpO2 100%  LMP 08/12/2014 Physical Exam  Constitutional: She is oriented to person, place, and time. She appears well-developed and well-nourished. No distress.  HENT:  Head: Normocephalic.  Right Ear: External ear normal.  Left Ear: External ear normal.  Mouth/Throat: Oropharynx is clear and moist.  Eyes: Pupils are equal, round, and reactive to light.  Neck: Normal range of motion. Neck supple.  Cardiovascular: Normal rate, regular rhythm, normal heart sounds and intact distal pulses.   Pulmonary/Chest: Effort normal and breath sounds normal. She exhibits tenderness.  Abdominal: Soft. Bowel  sounds are normal.  Lymphadenopathy:    She has no cervical adenopathy.  Neurological: She is alert and oriented to person, place, and time.  Skin: Skin is warm and dry.  Nursing note and vitals reviewed.   ED Course  Procedures (including critical care time) Labs Review Labs Reviewed  POCT I-STAT, CHEM 8 - Abnormal; Notable for the following:    Glucose, Bld 205 (*)    All other components within normal limits   bs 205 otherwise nl. Imaging Review No results found.   MDM   1. Diabetes mellitus type 2, uncontrolled   2. Essential hypertension        Billy Fischer, MD 09/12/14 [id number]

## 2015-04-21 ENCOUNTER — Encounter (HOSPITAL_COMMUNITY): Payer: Self-pay | Admitting: *Deleted

## 2015-04-21 ENCOUNTER — Emergency Department (HOSPITAL_COMMUNITY)
Admission: EM | Admit: 2015-04-21 | Discharge: 2015-04-22 | Disposition: A | Discharge status: Home / Self Care | Payer: Self-pay | Attending: Physician Assistant | Admitting: Physician Assistant

## 2015-04-21 DIAGNOSIS — Z87891 Personal history of nicotine dependence: Secondary | ICD-10-CM | POA: Insufficient documentation

## 2015-04-21 DIAGNOSIS — I10 Essential (primary) hypertension: Secondary | ICD-10-CM | POA: Insufficient documentation

## 2015-04-21 DIAGNOSIS — B349 Viral infection, unspecified: Secondary | ICD-10-CM | POA: Insufficient documentation

## 2015-04-21 DIAGNOSIS — Z872 Personal history of diseases of the skin and subcutaneous tissue: Secondary | ICD-10-CM | POA: Insufficient documentation

## 2015-04-21 DIAGNOSIS — Z79899 Other long term (current) drug therapy: Secondary | ICD-10-CM | POA: Insufficient documentation

## 2015-04-21 DIAGNOSIS — R1084 Generalized abdominal pain: Secondary | ICD-10-CM | POA: Insufficient documentation

## 2015-04-21 DIAGNOSIS — Z7951 Long term (current) use of inhaled steroids: Secondary | ICD-10-CM | POA: Insufficient documentation

## 2015-04-21 DIAGNOSIS — K219 Gastro-esophageal reflux disease without esophagitis: Secondary | ICD-10-CM | POA: Insufficient documentation

## 2015-04-21 DIAGNOSIS — E119 Type 2 diabetes mellitus without complications: Secondary | ICD-10-CM | POA: Insufficient documentation

## 2015-04-21 DIAGNOSIS — M109 Gout, unspecified: Secondary | ICD-10-CM | POA: Insufficient documentation

## 2015-04-21 DIAGNOSIS — Z791 Long term (current) use of non-steroidal anti-inflammatories (NSAID): Secondary | ICD-10-CM | POA: Insufficient documentation

## 2015-04-21 HISTORY — DX: Other psychoactive substance abuse, uncomplicated: F19.10

## 2015-04-21 LAB — COMPREHENSIVE METABOLIC PANEL
ALK PHOS: 93 U/L (ref 38–126)
ALT: 12 U/L — AB (ref 14–54)
ANION GAP: 13 (ref 5–15)
AST: 19 U/L (ref 15–41)
Albumin: 5.2 g/dL — ABNORMAL HIGH (ref 3.5–5.0)
BILIRUBIN TOTAL: 0.8 mg/dL (ref 0.3–1.2)
BUN: 8 mg/dL (ref 6–20)
CALCIUM: 10.2 mg/dL (ref 8.9–10.3)
CO2: 24 mmol/L (ref 22–32)
CREATININE: 0.64 mg/dL (ref 0.44–1.00)
Chloride: 97 mmol/L — ABNORMAL LOW (ref 101–111)
GFR calc non Af Amer: >60 mL/min (ref >60)
Glucose, Bld: 216 mg/dL — ABNORMAL HIGH (ref 65–99)
Potassium: 4 mmol/L (ref 3.5–5.1)
Sodium: 134 mmol/L — ABNORMAL LOW (ref 135–145)
TOTAL PROTEIN: 9.3 g/dL — AB (ref 6.5–8.1)

## 2015-04-21 LAB — CBC WITH DIFFERENTIAL/PLATELET
BASOS ABS: 0 10*3/uL (ref 0.0–0.1)
Basophils Relative: 0 %
EOS ABS: 0 10*3/uL (ref 0.0–0.7)
EOS PCT: 0 %
HEMATOCRIT: 41.4 % (ref 36.0–46.0)
HEMOGLOBIN: 14.4 g/dL (ref 12.0–15.0)
Lymphocytes Relative: 9 %
Lymphs Abs: 1.1 10*3/uL (ref 0.7–4.0)
MCH: 28.5 pg (ref 26.0–34.0)
MCHC: 34.8 g/dL (ref 30.0–36.0)
MCV: 82 fL (ref 78.0–100.0)
Monocytes Absolute: 0.5 10*3/uL (ref 0.1–1.0)
Monocytes Relative: 4 %
Neutro Abs: 10.9 10*3/uL — ABNORMAL HIGH (ref 1.7–7.7)
Neutrophils Relative %: 87 %
Platelets: 265 10*3/uL (ref 150–400)
RBC: 5.05 MIL/uL (ref 3.87–5.11)
RDW: 13.3 % (ref 11.5–15.5)
WBC: 12.5 10*3/uL — AB (ref 4.0–10.5)

## 2015-04-21 LAB — URINALYSIS, ROUTINE W REFLEX MICROSCOPIC
BILIRUBIN URINE: NEGATIVE
Ketones, ur: 40 mg/dL — AB
LEUKOCYTES UA: NEGATIVE
NITRITE: NEGATIVE
PH: 5 (ref 5.0–8.0)
PROTEIN: 30 mg/dL — AB
Specific Gravity, Urine: 1.021 (ref 1.005–1.030)

## 2015-04-21 LAB — URINE MICROSCOPIC-ADD ON: WBC UA: NONE SEEN WBC/hpf (ref 0–5)

## 2015-04-21 MED ORDER — HYDROMORPHONE HCL 1 MG/ML IJ SOLN
1.0000 mg | Freq: Once | INTRAMUSCULAR | Status: AC
  Administered 2015-04-21: 1 mg via INTRAVENOUS
  Filled 2015-04-21: qty 1

## 2015-04-21 MED ORDER — SODIUM CHLORIDE 0.9 % IV BOLUS (SEPSIS): 1000.0000 mL | Freq: Once | INTRAVENOUS | Status: DC

## 2015-04-21 MED ORDER — ONDANSETRON 4 MG PO TBDP: 4.0000 mg | ORAL_TABLET | Freq: Three times a day (TID) | ORAL | Status: DC | PRN

## 2015-04-21 MED ORDER — ONDANSETRON HCL 4 MG/2ML IJ SOLN
4.0000 mg | Freq: Once | INTRAMUSCULAR | Status: AC
  Administered 2015-04-21: 4 mg via INTRAVENOUS
  Filled 2015-04-21: qty 2

## 2015-04-21 MED ORDER — SODIUM CHLORIDE 0.9 % IV BOLUS (SEPSIS)
1000.0000 mL | Freq: Once | INTRAVENOUS | Status: AC
  Administered 2015-04-21: 1000 mL via INTRAVENOUS

## 2015-04-21 NOTE — ED Notes (Signed)
Pt states that she began feeling sick yesterday and has loss of appetite. She has been feeling "hot and can't get cooled down". She hasn't taken her medication today, including medicine for hypertension and diabetes. Pt is from a rehab facility.

## 2015-04-21 NOTE — ED Provider Notes (Signed)
CSN: [id number]     Arrival date & time 04/21/15  1919 History   First MD Initiated Contact with Patient 04/21/15 2006     Chief Complaint  Patient presents with  . Hot Flashes     (Consider location/radiation/quality/duration/timing/severity/associated sxs/prior Treatment) Patient is a 44 y.o. female presenting with diarrhea. The history is provided by the patient. No language interpreter was used.  Diarrhea Quality:  Watery Severity:  Moderate Onset quality:  Gradual Number of episodes:  Multiple Duration:  1 day Timing:  Constant Progression:  Worsening Relieved by:  Nothing Worsened by:  Nothing tried Ineffective treatments:  None tried Associated symptoms: vomiting   Risk factors: no recent antibiotic use, no sick contacts, no suspicious food intake and no travel to endemic areas   Pt reports she has had multiple episodes of diarrhea and vomiting. Pt reports decreased appetite starting yesterday.  Pt complains of feeling like her heart is racing  Past Medical History  Diagnosis Date  . Diabetes mellitus   . Hypertension   . GERD (gastroesophageal reflux disease)   . Gout   . Seasonal allergies   . Eczema   . Polysubstance abuse     ETOH and Cocaine   Past Surgical History  Procedure Laterality Date  . Hemorrhoid surgery     Family History  Problem Relation Age of Onset  . Hypertension Father   . Diabetes Father   . Hypertension Other   . Diabetes Other    Social History  Substance Use Topics  . Smoking status: Former Smoker -- 0.50 packs/day  . Smokeless tobacco: Former Systems developer    Quit date: 03/14/2013  . Alcohol Use: No     Comment: none since 03-14-14   OB History    Gravida Para Term Preterm AB TAB SAB Ectopic Multiple Living   0         0     Review of Systems  Gastrointestinal: Positive for vomiting and diarrhea.  All other systems reviewed and are negative.     Allergies  Cephalexin; Hydrocodone; Neomycin-bacitracin zn-polymyx; Sertraline;  and Adhesive  Home Medications   Prior to Admission medications   Medication Sig Start Date End Date Taking? Authorizing Provider  amLODipine (NORVASC) 10 MG tablet Take 10 mg by mouth daily.   Yes Historical Provider, MD  atenolol (TENORMIN) 100 MG tablet Take 100 mg by mouth daily.   Yes Historical Provider, MD  diphenhydrAMINE (BENADRYL) 25 MG tablet Take 25 mg by mouth 3 (three) times daily as needed for itching or allergies.   Yes Historical Provider, MD  esomeprazole (NEXIUM) 20 MG capsule Take 20 mg by mouth daily as needed (acid reflux).   Yes Historical Provider, MD  fluticasone (FLONASE) 50 MCG/ACT nasal spray Place 2 sprays into both nostrils daily as needed for allergies or rhinitis.   Yes Historical Provider, MD  hydrocortisone cream 1 % Apply 1 application topically 2 (two) times daily as needed for itching.   Yes Historical Provider, MD  indomethacin (INDOCIN) 50 MG capsule Take 50 mg by mouth 2 (two) times daily.   Yes Historical Provider, MD  lisinopril (PRINIVIL,ZESTRIL) 40 MG tablet Take 40 mg by mouth daily.   Yes Historical Provider, MD  loratadine (CLARITIN) 10 MG tablet Take 10 mg by mouth daily.   Yes Historical Provider, MD  Menthol, Topical Analgesic, (BIOFREEZE) 4 % GEL Apply 1 application topically daily as needed (pain).   Yes Historical Provider, MD  metFORMIN (GLUCOPHAGE) 1000 MG tablet Take  1,000 mg by mouth 2 (two) times daily.   Yes Historical Provider, MD  Multiple Vitamin (MULTIVITAMIN WITH MINERALS) TABS tablet Take 1 tablet by mouth daily. Only takes Monday through Friday   Yes Historical Provider, MD  omeprazole (PRILOSEC) 40 MG capsule Take 40 mg by mouth daily.   Yes Historical Provider, MD  allopurinol (ZYLOPRIM) 100 MG tablet Take 100 mg by mouth daily.    Historical Provider, MD   BP 186/94 mmHg  Pulse 128  Temp(Src) 98.7 F (37.1 C) (Oral)  Resp 21  SpO2 100%  LMP 04/07/2015 (Approximate) Physical Exam  Constitutional: She is oriented to  person, place, and time. She appears well-developed and well-nourished.  HENT:  Head: Normocephalic and atraumatic.  Right Ear: External ear normal.  Left Ear: External ear normal.  Nose: Nose normal.  Mouth/Throat: Oropharynx is clear and moist.  Eyes: Conjunctivae and EOM are normal. Pupils are equal, round, and reactive to light.  Neck: Normal range of motion.  Cardiovascular: Normal rate and normal heart sounds.   Pulmonary/Chest: Effort normal.  Abdominal: She exhibits no distension and no mass. There is tenderness. There is no rebound and no guarding.  Diffusely tender  Musculoskeletal: Normal range of motion.  Neurological: She is alert and oriented to person, place, and time.  Skin: Skin is warm.  Psychiatric: She has a normal mood and affect.  Nursing note and vitals reviewed.   ED Course  Procedures (including critical care time) Labs Review Labs Reviewed  CBC WITH DIFFERENTIAL/PLATELET - Abnormal; Notable for the following:    WBC 12.5 (*)    Neutro Abs 10.9 (*)    All other components within normal limits  COMPREHENSIVE METABOLIC PANEL - Abnormal; Notable for the following:    Sodium 134 (*)    Chloride 97 (*)    Glucose, Bld 216 (*)    Total Protein 9.3 (*)    Albumin 5.2 (*)    ALT 12 (*)    All other components within normal limits  URINALYSIS, ROUTINE W REFLEX MICROSCOPIC (NOT AT [facility]) - Abnormal; Notable for the following:    APPearance CLOUDY (*)    Glucose, UA >1000 (*)    Hgb urine dipstick TRACE (*)    Ketones, ur 40 (*)    Protein, ur 30 (*)    All other components within normal limits  URINE MICROSCOPIC-ADD ON - Abnormal; Notable for the following:    Squamous Epithelial / LPF 0-5 (*)    Bacteria, UA FEW (*)    Crystals URIC ACID CRYSTALS (*)    All other components within normal limits    Imaging Review No results found. I have personally reviewed and evaluated these images and lab results as part of my medical decision-making.   EKG  Interpretation None      MDM Pt given Iv fluids x 2 liters.  Pt given zofran and dilaudid for diarrhea, cramping and vomiting.  Pt feels much better.  Pt counseled.  Pt probably has viral gi virus.  Pt advised to follow up with her Md for recheck.   Final diagnoses:  Viral illness        Fransico Meadow, PA-C 04/21/15 [place], MD 04/22/15 [phone]
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
## 2015-04-21 NOTE — Discharge Instructions (Signed)

## 2015-04-21 NOTE — ED Notes (Signed)
Pt states that she has been having several episodes of diarrhea and emesis. This started at 0400 this am. Pt currently complains of abdominal cramps.

## 2015-04-22 ENCOUNTER — Emergency Department (HOSPITAL_COMMUNITY): Payer: Self-pay

## 2015-04-22 ENCOUNTER — Emergency Department (HOSPITAL_COMMUNITY)
Admission: EM | Admit: 2015-04-22 | Discharge: 2015-04-23 | Disposition: A | Discharge status: Home / Self Care | Payer: Self-pay | Attending: Physician Assistant | Admitting: Physician Assistant

## 2015-04-22 ENCOUNTER — Encounter (HOSPITAL_COMMUNITY): Payer: Self-pay | Admitting: Emergency Medicine

## 2015-04-22 DIAGNOSIS — D219 Benign neoplasm of connective and other soft tissue, unspecified: Secondary | ICD-10-CM

## 2015-04-22 DIAGNOSIS — B349 Viral infection, unspecified: Secondary | ICD-10-CM | POA: Insufficient documentation

## 2015-04-22 DIAGNOSIS — Z79899 Other long term (current) drug therapy: Secondary | ICD-10-CM | POA: Insufficient documentation

## 2015-04-22 DIAGNOSIS — Z872 Personal history of diseases of the skin and subcutaneous tissue: Secondary | ICD-10-CM | POA: Insufficient documentation

## 2015-04-22 DIAGNOSIS — Z3202 Encounter for pregnancy test, result negative: Secondary | ICD-10-CM | POA: Insufficient documentation

## 2015-04-22 DIAGNOSIS — D259 Leiomyoma of uterus, unspecified: Secondary | ICD-10-CM | POA: Insufficient documentation

## 2015-04-22 DIAGNOSIS — K219 Gastro-esophageal reflux disease without esophagitis: Secondary | ICD-10-CM | POA: Insufficient documentation

## 2015-04-22 DIAGNOSIS — M109 Gout, unspecified: Secondary | ICD-10-CM | POA: Insufficient documentation

## 2015-04-22 DIAGNOSIS — Z87891 Personal history of nicotine dependence: Secondary | ICD-10-CM | POA: Insufficient documentation

## 2015-04-22 DIAGNOSIS — E119 Type 2 diabetes mellitus without complications: Secondary | ICD-10-CM | POA: Insufficient documentation

## 2015-04-22 DIAGNOSIS — I10 Essential (primary) hypertension: Secondary | ICD-10-CM | POA: Insufficient documentation

## 2015-04-22 DIAGNOSIS — Z791 Long term (current) use of non-steroidal anti-inflammatories (NSAID): Secondary | ICD-10-CM | POA: Insufficient documentation

## 2015-04-22 LAB — HCG, QUANTITATIVE, PREGNANCY: hCG, Beta Chain, Quant, S: 1 m[IU]/mL (ref <5)

## 2015-04-22 LAB — CBC
HEMATOCRIT: 39.9 % (ref 36.0–46.0)
Hemoglobin: 13.7 g/dL (ref 12.0–15.0)
MCH: 28.5 pg (ref 26.0–34.0)
MCHC: 34.3 g/dL (ref 30.0–36.0)
MCV: 83.1 fL (ref 78.0–100.0)
Platelets: 256 10*3/uL (ref 150–400)
RBC: 4.8 MIL/uL (ref 3.87–5.11)
RDW: 13.3 % (ref 11.5–15.5)
WBC: 14.9 10*3/uL — ABNORMAL HIGH (ref 4.0–10.5)

## 2015-04-22 LAB — WET PREP, GENITAL
SPERM: NONE SEEN
Trich, Wet Prep: NONE SEEN
Yeast Wet Prep HPF POC: NONE SEEN

## 2015-04-22 LAB — BASIC METABOLIC PANEL
ANION GAP: 10 (ref 5–15)
BUN: 11 mg/dL (ref 6–20)
CALCIUM: 10.4 mg/dL — AB (ref 8.9–10.3)
CO2: 28 mmol/L (ref 22–32)
CREATININE: 0.7 mg/dL (ref 0.44–1.00)
Chloride: 100 mmol/L — ABNORMAL LOW (ref 101–111)
Glucose, Bld: 215 mg/dL — ABNORMAL HIGH (ref 65–99)
Potassium: 4.6 mmol/L (ref 3.5–5.1)
Sodium: 138 mmol/L (ref 135–145)

## 2015-04-22 LAB — CBG MONITORING, ED: Glucose-Capillary: 195 mg/dL — ABNORMAL HIGH (ref 65–99)

## 2015-04-22 MED ORDER — PROCHLORPERAZINE EDISYLATE 5 MG/ML IJ SOLN
10.0000 mg | Freq: Once | INTRAMUSCULAR | Status: AC
  Administered 2015-04-22: 10 mg via INTRAVENOUS
  Filled 2015-04-22: qty 2

## 2015-04-22 MED ORDER — SODIUM CHLORIDE 0.9 % IV BOLUS (SEPSIS)
1000.0000 mL | Freq: Once | INTRAVENOUS | Status: AC
  Administered 2015-04-22: 1000 mL via INTRAVENOUS

## 2015-04-22 MED ORDER — KETOROLAC TROMETHAMINE 30 MG/ML IJ SOLN
30.0000 mg | Freq: Once | INTRAMUSCULAR | Status: AC
  Administered 2015-04-22: 30 mg via INTRAVENOUS
  Filled 2015-04-22: qty 1

## 2015-04-22 MED ORDER — IBUPROFEN 800 MG PO TABS: 800.0000 mg | ORAL_TABLET | Freq: Three times a day (TID) | ORAL | Status: DC

## 2015-04-22 MED ORDER — IOHEXOL 300 MG/ML  SOLN
100.0000 mL | Freq: Once | INTRAMUSCULAR | Status: AC | PRN
  Administered 2015-04-22: 100 mL via INTRAVENOUS

## 2015-04-22 MED ORDER — ONDANSETRON 4 MG PO TBDP
4.0000 mg | ORAL_TABLET | Freq: Once | ORAL | Status: AC | PRN
  Administered 2015-04-22: 4 mg via ORAL
  Filled 2015-04-22: qty 1

## 2015-04-22 MED ORDER — IOHEXOL 300 MG/ML  SOLN
25.0000 mL | Freq: Once | INTRAMUSCULAR | Status: AC | PRN
  Administered 2015-04-22: 25 mL via ORAL

## 2015-04-22 MED ORDER — METRONIDAZOLE 500 MG PO TABS
500.0000 mg | ORAL_TABLET | Freq: Once | ORAL | Status: AC
  Administered 2015-04-23: 500 mg via ORAL
  Filled 2015-04-22: qty 1

## 2015-04-22 MED ORDER — METRONIDAZOLE 500 MG PO TABS: 500.0000 mg | ORAL_TABLET | Freq: Two times a day (BID) | ORAL | Status: DC

## 2015-04-22 MED ORDER — DICYCLOMINE HCL 10 MG PO CAPS
10.0000 mg | ORAL_CAPSULE | Freq: Once | ORAL | Status: AC
  Administered 2015-04-22: 10 mg via ORAL
  Filled 2015-04-22: qty 1

## 2015-04-22 NOTE — ED Provider Notes (Addendum)
CSN: [id number]     Arrival date & time 04/22/15  1617 History   First MD Initiated Contact with Patient 04/22/15 2047     Chief Complaint  Patient presents with  . Abdominal Pain  . Emesis  . Headache     (Consider location/radiation/quality/duration/timing/severity/associated sxs/prior Treatment) HPI    Patient is a 44 her old female with past medical history significant for diabetes, hypertension, GERD, EtOH and cocaine abuse. Patient's presenting for the second time in 2 days with similar complaints. Yesterday patient was seen for nausea vomiting diarrhea 2 days. She was given fluids and Zofran. She felt much better went home and she said she woke up this morning felt a little bit worse. She developed a headache. She said she has had headaches on and off since the nausea and vomiting symptoms. Headache is bilateral in nature, worse with bright lights, better with rest.  Patient was able to eat lightly today. Patient reports abdominal pain worse in the epigastric area. She reports cramping. Patient reports no actual vomiting or diarrhea since she was seen yesterday.  Past Medical History  Diagnosis Date  . Diabetes mellitus   . Hypertension   . GERD (gastroesophageal reflux disease)   . Gout   . Seasonal allergies   . Eczema   . Polysubstance abuse     ETOH and Cocaine   Past Surgical History  Procedure Laterality Date  . Hemorrhoid surgery     Family History  Problem Relation Age of Onset  . Hypertension Father   . Diabetes Father   . Hypertension Other   . Diabetes Other    Social History  Substance Use Topics  . Smoking status: Former Smoker -- 0.50 packs/day  . Smokeless tobacco: Former Systems developer    Quit date: 03/14/2013  . Alcohol Use: No     Comment: none since 03-14-14   OB History    Gravida Para Term Preterm AB TAB SAB Ectopic Multiple Living   0         0     Review of Systems  Constitutional: Negative for activity change.  Respiratory: Negative for  shortness of breath.   Cardiovascular: Negative for chest pain.  Gastrointestinal: Positive for nausea and abdominal pain.  Genitourinary: Negative for dysuria.  Neurological: Positive for headaches.  Psychiatric/Behavioral: Negative for agitation.      Allergies  Cephalexin; Hydrocodone; Neomycin-bacitracin zn-polymyx; Sertraline; and Adhesive  Home Medications   Prior to Admission medications   Medication Sig Start Date End Date Taking? Authorizing Provider  amLODipine (NORVASC) 10 MG tablet Take 10 mg by mouth daily.   Yes Historical Provider, MD  atenolol (TENORMIN) 100 MG tablet Take 100 mg by mouth daily.   Yes Historical Provider, MD  indomethacin (INDOCIN) 50 MG capsule Take 50 mg by mouth 2 (two) times daily.   Yes Historical Provider, MD  lisinopril (PRINIVIL,ZESTRIL) 40 MG tablet Take 40 mg by mouth daily.   Yes Historical Provider, MD  loratadine (CLARITIN) 10 MG tablet Take 10 mg by mouth daily.   Yes Historical Provider, MD  metFORMIN (GLUCOPHAGE) 1000 MG tablet Take 1,000 mg by mouth 2 (two) times daily.   Yes Historical Provider, MD  Multiple Vitamin (MULTIVITAMIN WITH MINERALS) TABS tablet Take 1 tablet by mouth daily. Only takes Monday through Friday   Yes Historical Provider, MD  omeprazole (PRILOSEC) 40 MG capsule Take 40 mg by mouth daily.   Yes Historical Provider, MD  ondansetron (ZOFRAN ODT) 4 MG disintegrating tablet Take  1 tablet (4 mg total) by mouth every 8 (eight) hours as needed for nausea or vomiting. 04/21/15  Yes Hollace Kinnier Sofia, PA-C  diphenhydrAMINE (BENADRYL) 25 MG tablet Take 25 mg by mouth 3 (three) times daily as needed for itching or allergies.    Historical Provider, MD  esomeprazole (NEXIUM) 20 MG capsule Take 20 mg by mouth daily as needed (acid reflux).    Historical Provider, MD  fluticasone (FLONASE) 50 MCG/ACT nasal spray Place 2 sprays into both nostrils daily as needed for allergies or rhinitis.    Historical Provider, MD  hydrocortisone  cream 1 % Apply 1 application topically 2 (two) times daily as needed for itching.    Historical Provider, MD  Menthol, Topical Analgesic, (BIOFREEZE) 4 % GEL Apply 1 application topically daily as needed (pain).    Historical Provider, MD   BP 181/92 mmHg  Pulse 87  Temp(Src) 98.8 F (37.1 C) (Oral)  Resp 18  SpO2 100%  LMP 04/07/2015 (Approximate) Physical Exam  Constitutional: She is oriented to person, place, and time. She appears well-developed and well-nourished.  HENT:  Head: Normocephalic and atraumatic.  Mild proptosis, chronic  Eyes: Conjunctivae are normal. Right eye exhibits no discharge.  Neck: Neck supple.  Cardiovascular: Normal rate, regular rhythm and normal heart sounds.   No murmur heard. Pulmonary/Chest: Effort normal and breath sounds normal. She has no wheezes. She has no rales.  Abdominal: Soft. She exhibits no distension. There is tenderness.  Diffuse tenderness.  Musculoskeletal: Normal range of motion.  Neurological: She is oriented to person, place, and time. No cranial nerve deficit.  Skin: Skin is warm and dry. No rash noted. She is not diaphoretic.  Psychiatric: She has a normal mood and affect. Her behavior is normal.  Nursing note and vitals reviewed.   ED Course  Procedures (including critical care time) Labs Review Labs Reviewed  BASIC METABOLIC PANEL - Abnormal; Notable for the following:    Chloride 100 (*)    Glucose, Bld 215 (*)    Calcium 10.4 (*)    All other components within normal limits  CBC - Abnormal; Notable for the following:    WBC 14.9 (*)    All other components within normal limits  CBG MONITORING, ED - Abnormal; Notable for the following:    Glucose-Capillary 195 (*)    All other components within normal limits  WET PREP, GENITAL  HCG, QUANTITATIVE, PREGNANCY  HIV ANTIBODY (ROUTINE TESTING)  RPR  GC/CHLAMYDIA PROBE AMP ([place]) NOT AT [facility]    Imaging Review Ct Abdomen Pelvis W Contrast  04/22/2015   CLINICAL DATA:  Cramping abdominal pain beginning 4 days ago with nausea, vomiting, headache, fevers and chills, diarrhea. History of diabetes, polysubstance abuse, hypertension. EXAM: CT ABDOMEN AND PELVIS WITH CONTRAST TECHNIQUE: Multidetector CT imaging of the abdomen and pelvis was performed using the standard protocol following bolus administration of intravenous contrast. CONTRAST:  69mL OMNIPAQUE IOHEXOL 300 MG/ML SOLN, 163mL OMNIPAQUE IOHEXOL 300 MG/ML SOLN COMPARISON:  None. FINDINGS: LUNG BASES: Included view of the lung bases are clear. Visualized heart and pericardium are unremarkable. SOLID ORGANS: The liver, spleen, gallbladder, pancreas are unremarkable. Mildly thickened adrenal glands can be seen with hyperplasia without discrete nodule. GASTROINTESTINAL TRACT: The stomach, small and large bowel are normal in course and caliber without inflammatory changes. Linear density at the level of the anorectal junction may represent surgical material. Normal appendix. KIDNEYS/ URINARY TRACT: Kidneys are orthotopic, demonstrating symmetric enhancement. No nephrolithiasis, hydronephrosis or solid renal  masses. The unopacified ureters are normal in course and caliber. Delayed imaging through the kidneys demonstrates symmetric prompt contrast excretion within the proximal urinary collecting system. Urinary bladder is partially distended and unremarkable. PERITONEUM/RETROPERITONEUM: Aortoiliac vessels are normal in course and caliber, mild calcific atherosclerosis. No lymphadenopathy by CT size criteria. No intraperitoneal free fluid nor free air. Tubular cystic structures in the pelvis. Lobulated uterine contour most consistent with leiomyomas. 7.5 x 6 cm exophytic, as seen sub serosal leiomyoma from the uterine fundus. SOFT TISSUE/OSSEOUS STRUCTURES: Non-suspicious. IMPRESSION: Tubular cystic structures in the pelvis likely represent hydrosalpinx, less likely pyosalpinx. Multiple uterine leiomyomata, including  7.5 x 6 cm sub serosal leiomyoma. Findings would be better characterized on pelvic sonogram. Electronically Signed   By: Elon Alas M.D.   On: 04/22/2015 22:50   I have personally reviewed and evaluated these images and lab results as part of my medical decision-making.   EKG Interpretation None      MDM   Final diagnoses:  None    Patient is a 44 year old female presenting for the second time in 2 days. Patient had recent  symptoms of nausea and vomiting likely viral. Afterwards patient had headache. Patient was seen yesterday when she received fluids and Zofran and sent home. Patient felt much improved. However this morning she awoke and developed aheadache. Has had no neurologic symptoms. Patient did not try to take anything at home. Patient not afebrile, not tachycardic and normal physical exam, no cranial nerve involvement.  We will give fluids, get labs, give migraine medications. Patient received IV narcotics yesterday, we will not be giving any IV narcotics today.  11:35 PM  Patient's CAT scan showed hydrosalpinx and leiomyoma. I did a pelvic exam which showed a small amount of white discharge. Otherwise no pain, normal-appearing cervix. Doubt PID in this female given her normal vaginal exam. We will send cultures.  While I don't think there is any need to do a transvaginal ultrasoun emergently, patient has had poor follow up in the past.  Will get TVUS today to  better characterize the findings at this time.  If no evidence of TOA or infection on TVUS, plan to discharge with symtpomatic care at home. I have no explanation for white count except viral infection.  She is eating here, non febrile, with normal labs and physcial exam otherwsie.     Courteney Julio Alm, MD 04/22/15 2336  Courteney Julio Alm, MD 04/22/15 [id number]

## 2015-04-22 NOTE — ED Notes (Signed)
Pt tolerating po intake

## 2015-04-22 NOTE — Discharge Instructions (Signed)
We are sorry you are still not feeling well.  We want you to take ibuprofen and rest at home. Please be sure to drink plenty of fluids. If you're having any increase in symptoms please return to your primary care physician tomorrow or to the emergency department with more concerns.  We did treat you for a bacteria that can cuase your pH to be off in your vagina, please take the medications as perscribed.

## 2015-04-22 NOTE — ED Notes (Signed)
Pt complaining of a cramping abdominal pain starting Monday, with vomiting, nausea, headache, fever/chills, and diarrhea. Was seen yesterday here and diagnosed with a virus. Moaning in triage.

## 2015-04-22 NOTE — Progress Notes (Signed)
[facility] provided patient with contact information to [facility], informed patient of services there.  EDCM also provided patient with list of pcps who accept self pay patients, list of discount pharmacies and websites needymeds.org and GoodRX.com for medication assistance, phone number to inquire about the orange card, phone number to inquire about [employer], phone number to inquire about the [place], financial resources in the community such as local churches, salvation army, urban ministries, and dental assistance for uninsured patients. No further EDCM needs at this time.  EDCM placed theses resources on bedside chair.  Patient sleeping when [facility] entered the room.  EDCM did not wake patient at this time.

## 2015-04-23 LAB — GC/CHLAMYDIA PROBE AMP (~~LOC~~) NOT AT ARMC
Chlamydia: NEGATIVE
NEISSERIA GONORRHEA: NEGATIVE

## 2015-04-23 LAB — RPR: RPR Ser Ql: NONREACTIVE

## 2015-04-23 LAB — HIV ANTIBODY (ROUTINE TESTING W REFLEX): HIV SCREEN 4TH GENERATION: NONREACTIVE

## 2015-10-02 ENCOUNTER — Telehealth: Payer: Self-pay | Admitting: *Deleted

## 2015-10-02 ENCOUNTER — Encounter: Payer: Self-pay | Admitting: *Deleted

## 2015-10-02 NOTE — Telephone Encounter (Signed)
Unable to reach patient at time of pre-visit call. Left message for patient to return call when available.  

## 2015-10-02 NOTE — Telephone Encounter (Signed)
Pre-Visit Call completed with patient and chart updated.   Pre-Visit Info documented in Specialty Comments under Snap[employer].    

## 2015-10-05 ENCOUNTER — Ambulatory Visit (INDEPENDENT_AMBULATORY_CARE_PROVIDER_SITE_OTHER): Payer: PRIVATE HEALTH INSURANCE | Admitting: Family Medicine

## 2015-10-05 ENCOUNTER — Other Ambulatory Visit (HOSPITAL_COMMUNITY)
Admission: RE | Admit: 2015-10-05 | Discharge: 2015-10-05 | Disposition: A | Discharge status: Home / Self Care | Payer: PRIVATE HEALTH INSURANCE | Source: Ambulatory Visit | Attending: Family Medicine | Admitting: Family Medicine

## 2015-10-05 ENCOUNTER — Encounter: Payer: Self-pay | Admitting: Family Medicine

## 2015-10-05 VITALS — BP 130/88 | HR 98 | Temp 98.4°F | Ht <= 58 in | Wt 166.4 lb

## 2015-10-05 DIAGNOSIS — IMO0002 Reserved for concepts with insufficient information to code with codable children: Secondary | ICD-10-CM | POA: Insufficient documentation

## 2015-10-05 DIAGNOSIS — M1A09X0 Idiopathic chronic gout, multiple sites, without tophus (tophi): Secondary | ICD-10-CM

## 2015-10-05 DIAGNOSIS — E785 Hyperlipidemia, unspecified: Secondary | ICD-10-CM

## 2015-10-05 DIAGNOSIS — Z1151 Encounter for screening for human papillomavirus (HPV): Secondary | ICD-10-CM | POA: Diagnosis present

## 2015-10-05 DIAGNOSIS — IMO0001 Reserved for inherently not codable concepts without codable children: Secondary | ICD-10-CM

## 2015-10-05 DIAGNOSIS — Z0000 Encounter for general adult medical examination without abnormal findings: Secondary | ICD-10-CM

## 2015-10-05 DIAGNOSIS — E1165 Type 2 diabetes mellitus with hyperglycemia: Secondary | ICD-10-CM | POA: Diagnosis not present

## 2015-10-05 DIAGNOSIS — E119 Type 2 diabetes mellitus without complications: Secondary | ICD-10-CM | POA: Insufficient documentation

## 2015-10-05 DIAGNOSIS — I10 Essential (primary) hypertension: Secondary | ICD-10-CM

## 2015-10-05 DIAGNOSIS — Z130 Encounter for screening for diseases of the blood and blood-forming organs and certain disorders involving the immune mechanism: Secondary | ICD-10-CM

## 2015-10-05 DIAGNOSIS — Z124 Encounter for screening for malignant neoplasm of cervix: Secondary | ICD-10-CM

## 2015-10-05 DIAGNOSIS — Z1239 Encounter for other screening for malignant neoplasm of breast: Secondary | ICD-10-CM

## 2015-10-05 DIAGNOSIS — Z01419 Encounter for gynecological examination (general) (routine) without abnormal findings: Secondary | ICD-10-CM | POA: Diagnosis present

## 2015-10-05 DIAGNOSIS — J3089 Other allergic rhinitis: Secondary | ICD-10-CM

## 2015-10-05 LAB — HEMOGLOBIN A1C: Hgb A1c MFr Bld: 8 % — ABNORMAL HIGH (ref 4.6–6.5)

## 2015-10-05 LAB — LIPID PANEL
CHOL/HDL RATIO: 2
CHOLESTEROL: 159 mg/dL (ref 0–200)
HDL: 75.9 mg/dL (ref >39.00)
LDL Cholesterol: 70 mg/dL (ref 0–99)
NonHDL: 82.72
TRIGLYCERIDES: 62 mg/dL (ref 0.0–149.0)
VLDL: 12.4 mg/dL (ref 0.0–40.0)

## 2015-10-05 LAB — COMPREHENSIVE METABOLIC PANEL
ALT: 12 U/L (ref 0–35)
AST: 15 U/L (ref 0–37)
Albumin: 4.4 g/dL (ref 3.5–5.2)
Alkaline Phosphatase: 89 U/L (ref 39–117)
BILIRUBIN TOTAL: 0.4 mg/dL (ref 0.2–1.2)
BUN: 13 mg/dL (ref 6–23)
CO2: 30 meq/L (ref 19–32)
CREATININE: 0.72 mg/dL (ref 0.40–1.20)
Calcium: 9.6 mg/dL (ref 8.4–10.5)
Chloride: 100 mEq/L (ref 96–112)
GFR: 111.53 mL/min (ref >60.00)
GLUCOSE: 148 mg/dL — AB (ref 70–99)
Potassium: 4.3 mEq/L (ref 3.5–5.1)
Sodium: 138 mEq/L (ref 135–145)
Total Protein: 7.8 g/dL (ref 6.0–8.3)

## 2015-10-05 LAB — CBC
HEMATOCRIT: 35.8 % — AB (ref 36.0–46.0)
Hemoglobin: 12 g/dL (ref 12.0–15.0)
MCHC: 33.5 g/dL (ref 30.0–36.0)
MCV: 81.8 fl (ref 78.0–100.0)
PLATELETS: 249 10*3/uL (ref 150.0–400.0)
RBC: 4.38 Mil/uL (ref 3.87–5.11)
RDW: 14.2 % (ref 11.5–15.5)
WBC: 8.2 10*3/uL (ref 4.0–10.5)

## 2015-10-05 LAB — URIC ACID: URIC ACID, SERUM: 7.8 mg/dL — AB (ref 2.4–7.0)

## 2015-10-05 MED ORDER — LISINOPRIL 5 MG PO TABS: 5.0000 mg | ORAL_TABLET | Freq: Every day | ORAL | Status: DC

## 2015-10-05 MED ORDER — LORATADINE 10 MG PO TABS: 10.0000 mg | ORAL_TABLET | Freq: Every day | ORAL | Status: DC

## 2015-10-05 NOTE — Progress Notes (Addendum)
[place] at [facility] [address] [place], [place] [address] [phone]  Date:  10/05/2015   Name:  Deborah [name]   DOB:  June 29, 1970   MRN:  [id number]  PCP:  Lamar Blinks, MD    Chief Complaint: Establish Care   History of Present Illness:  Deborah [name] is a 45 y.o. very pleasant female patient who presents with the following:  Here today as a new patient.  She is on atenolol for her BP, metformin for diabetes. She has been a pt with [facility] but would like to see [place] now that she has health insurnace.   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   She has been in recovery from substance abuse for about 3 years and is doing very well int this regard  She does not really check her glucose- we will do an A1c for her today She is fasting today for labs She does have a history of gout- as long as she uses the indocin her gout is under control.  She takes it once or twice a day. She had been given allopurinol but did not ever take it. States that she takes the indomethacin most days.  She last had a mammo in 2013, and a pap in 2012 She does not have an eye doctor, would like to get one Would like a referral for mammo and would like a pap today  LMP was 2 weeks ago She has not been taking atenolol or lisionopril for several months- is just on amlodipine for her BP at this time She is SA, they use condoms for protection Lab Results  Component Value Date   HGBA1C 10.4 07/29/2010     Patient Active Problem List   Diagnosis Date Noted  . CELLULITIS AND ABSCESS OF ORAL SOFT TISSUES 07/29/2010  . VAGINAL PRURITUS 07/06/2010  . 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 LATERAL EPICONDYLITIS, LEFT 11/03/2009  . MUSCULOSKELETAL PAIN 10/01/2009  . DYSLIPIDEMIA 12/05/2008  . FATTY LIVER DISEASE 10/20/2008  . NUMMULAR ECZEMA 05/22/2008  .  IMPETIGO 03/19/2008  . ALLERGIC REACTION 03/04/2008  . VAGINITIS, CANDIDAL 11/22/2007  . FEMALE INFERTILITY 05/01/2007  . ANEMIA, IRON DEFICIENCY 04/03/2007  . VAGINAL DISCHARGE 04/03/2007  . SMOKER 11/28/2006  . HYPERTENSION, MILD 11/28/2006  . HEMORRHOIDS, INTERNAL W/O COMPLICATION [id number]  . RHINITIS, ALLERGIC, DUE TO POLLEN 11/28/2006  . GERD 11/28/2006  . CONSTIPATION 11/28/2006  . DERMATITIS, ATOPIC 11/28/2006  . COCAINE ABUSE, HX OF 11/28/2006  . Type II or unspecified type diabetes mellitus without mention of complication, not stated as uncontrolled 09/14/2006  . LIVER FUNCTION TESTS, ABNORMAL 09/14/2006  . LUMBAGO 03/23/2006  . GOUT 06/25/2004  . 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 Diabetes mellitus   . Hypertension   . GERD (gastroesophageal reflux disease)   . Gout   . Seasonal allergies   . Eczema   . Polysubstance abuse     ETOH and Cocaine  . Allergy   . Anemia   . Arthritis   . Hyperlipidemia   . Neuromuscular disorder [facility])     Past Surgical History  Procedure Laterality Date  . Hemorrhoid surgery      Social History  Substance Use Topics  . Smoking status: Former Smoker -- 0.50 packs/day  . Smokeless tobacco: Former  User    Quit date: 03/14/2013  . Alcohol Use: No     Comment: none since 03-14-14    Family History  Problem Relation Age of Onset  . Hypertension Father   . Diabetes Father   . Cancer Father   . Hypertension Other   . Diabetes Other   . Diabetes Mother   . Hypertension Mother   . Diabetes Maternal Grandmother   . Hypertension Maternal Grandmother   . Diabetes Paternal Grandfather     Allergies  Allergen Reactions  . Cephalexin     REACTION: stomach cramps and hives  . Hydrocodone     headache  . Neomycin-Bacitracin Zn-Polymyx     REACTION: burning  . Sertraline Hives and Itching  . Adhesive [Tape] Hives and Rash    Medication list has been reviewed and  updated.  Current Outpatient Prescriptions on File Prior to Visit  Medication Sig Dispense Refill  . amLODipine (NORVASC) 10 MG tablet Take 10 mg by mouth daily.    Marland Kitchen atenolol (TENORMIN) 100 MG tablet Take 100 mg by mouth daily. Reported on 10/02/2015    . esomeprazole (NEXIUM) 20 MG capsule Take 20 mg by mouth daily as needed (acid reflux).    . fluticasone (FLONASE) 50 MCG/ACT nasal spray Place 2 sprays into both nostrils daily as needed for allergies or rhinitis.    . hydrocortisone cream 1 % Apply 1 application topically 2 (two) times daily as needed for itching.    . indomethacin (INDOCIN) 50 MG capsule Take 50 mg by mouth 2 (two) times daily.    Marland Kitchen lisinopril (PRINIVIL,ZESTRIL) 40 MG tablet Take 40 mg by mouth daily. Reported on 10/02/2015    . loratadine (CLARITIN) 10 MG tablet Take 10 mg by mouth daily.    . metFORMIN (GLUCOPHAGE) 1000 MG tablet Take 1,000 mg by mouth 2 (two) times daily.    . Multiple Vitamin (MULTIVITAMIN WITH MINERALS) TABS tablet Take 1 tablet by mouth daily. Reported on 10/02/2015     No current facility-administered medications on file prior to visit.    Review of Systems:  As per HPI- otherwise negative.   Physical Examination: Filed Vitals:   10/05/15 1036  BP: 130/88  Pulse: 98  Temp: 98.4 F (36.9 C)   Filed Vitals:   10/05/15 1036  Height: 4' 0.75" (1.238 m)  Weight: 166 lb 6.4 oz (75.479 kg)   Body mass index is 49.25 kg/(m^2). Ideal Body Weight: Weight in (lb) to have BMI = 25: 84.3  GEN: WDWN, NAD, Non-toxic, A & O x 3, obese, looks well HEENT: Atraumatic, Normocephalic. Neck supple. No masses, No LAD. Ears and Nose: No external deformity. CV: RRR, No M/G/R. No JVD. No thrill. No extra heart sounds. PULM: CTA B, no wheezes, crackles, rhonchi. No retractions. No resp. distress. No accessory muscle use. ABD: S, NT, ND, +BS. No rebound. No HSM. EXTR: No c/c/e NEURO Normal gait.  PSYCH: Normally interactive. Conversant. Not depressed or  anxious appearing.  Calm demeanor.  Breast: normal exam, no masses/ dimpling/ discharge Pelvic: normal, no vaginal lesions or discharge. Uterus normal, no CMT, no adnexal tendereness or masses    Assessment and Plan: Physical exam  Idiopathic chronic gout of multiple sites without tophus - Plan: Uric acid  Uncontrolled type 2 diabetes mellitus without complication, without long-term current use of insulin ([place]) - Plan: Hemoglobin A1c, Comprehensive metabolic panel, Ambulatory referral to Ophthalmology, lisinopril (PRINIVIL,ZESTRIL) 5 MG tablet  Hyperlipidemia - Plan: Lipid panel  Screening for  cervical cancer - Plan: Cytology - PAP  Screening for deficiency anemia - Plan: CBC  Screening for breast cancer - Plan: MM Digital Screening  Essential hypertension - Plan: lisinopril (PRINIVIL,ZESTRIL) 5 MG tablet  Other allergic rhinitis - Plan: loratadine (CLARITIN) 10 MG tablet physical exam today Await labs to eval control of her DM It was very nice to see you today!  I will be in touch with your labs asap Your blood pressure looks fine I will refer you for a mammogram and also for an eye exam If your uric acid level is high, I would suggest that we start some allopurinol to help lower your uric acid and just use the indomethacin as needed    Signed Lamar Blinks, MD  Called 5/4 to go over labs.  LMOM. Her A1c is indeed better.  Pap is  Normal.  Will rx allopurinol for her.  Please come and see me in 3 months   Results for orders placed or performed in visit on 10/05/15  Uric acid  Result Value Ref Range   Uric Acid, Serum 7.8 (H) 2.4 - 7.0 mg/dL  Hemoglobin A1c  Result Value Ref Range   Hgb A1c MFr Bld 8.0 (H) 4.6 - 6.5 %  Comprehensive metabolic panel  Result Value Ref Range   Sodium 138 135 - 145 mEq/L   Potassium 4.3 3.5 - 5.1 mEq/L   Chloride 100 96 - 112 mEq/L   CO2 30 19 - 32 mEq/L   Glucose, Bld 148 (H) 70 - 99 mg/dL   BUN 13 6 - 23 mg/dL   Creatinine, Ser  0.72 0.40 - 1.20 mg/dL   Total Bilirubin 0.4 0.2 - 1.2 mg/dL   Alkaline Phosphatase 89 39 - 117 U/L   AST 15 0 - 37 U/L   ALT 12 0 - 35 U/L   Total Protein 7.8 6.0 - 8.3 g/dL   Albumin 4.4 3.5 - 5.2 g/dL   Calcium 9.6 8.4 - 10.5 mg/dL   GFR 111.53 >60.00 mL/min  CBC  Result Value Ref Range   WBC 8.2 4.0 - 10.5 K/uL   RBC 4.38 3.87 - 5.11 Mil/uL   Platelets 249.0 150.0 - 400.0 K/uL   Hemoglobin 12.0 12.0 - 15.0 g/dL   HCT 35.8 (L) 36.0 - 46.0 %   MCV 81.8 78.0 - 100.0 fl   MCHC 33.5 30.0 - 36.0 g/dL   RDW 14.2 11.5 - 15.5 %  Lipid panel  Result Value Ref Range   Cholesterol 159 0 - 200 mg/dL   Triglycerides 62.0 0.0 - 149.0 mg/dL   HDL 75.90 >39.00 mg/dL   VLDL 12.4 0.0 - 40.0 mg/dL   LDL Cholesterol 70 0 - 99 mg/dL   Total CHOL/HDL Ratio 2    NonHDL 82.72   Cytology - PAP  Result Value Ref Range   CYTOLOGY - PAP PAP RESULT

## 2015-10-05 NOTE — Patient Instructions (Addendum)
It was very nice to see you today!  I will be in touch with your labs asap Your blood pressure looks fine I will refer you for a mammogram and also for an eye exam If your uric acid level is high, I would suggest that we start some allopurinol to help lower your uric acid and just use the indomethacin as needed

## 2015-10-06 LAB — CYTOLOGY - PAP

## 2015-10-06 LAB — HM DIABETES EYE EXAM

## 2015-10-08 ENCOUNTER — Encounter: Payer: Self-pay | Admitting: Family Medicine

## 2015-10-08 ENCOUNTER — Telehealth: Payer: Self-pay | Admitting: Family Medicine

## 2015-10-08 MED ORDER — ALLOPURINOL 100 MG PO TABS: 100.0000 mg | ORAL_TABLET | Freq: Every day | ORAL | Status: DC

## 2015-10-08 NOTE — Telephone Encounter (Signed)
error 

## 2015-10-08 NOTE — Addendum Note (Signed)
Addended by: Lamar Blinks C on: 10/08/2015 05:43 PM   Modules accepted: Orders

## 2015-11-17 ENCOUNTER — Telehealth: Payer: Self-pay | Admitting: Family Medicine

## 2015-11-17 NOTE — Telephone Encounter (Signed)
Relation to [id number] Call back number:[phone] Pharmacy: [place], [place] [phone] (Phone) [phone] (Fax)         Reason for call:  Patient requesting a refill metFORMIN (GLUCOPHAGE) 1000 MG tablet and amLODipine (NORVASC) 10 MG tablet

## 2015-11-19 MED ORDER — METFORMIN HCL 1000 MG PO TABS: 1000.0000 mg | ORAL_TABLET | Freq: Two times a day (BID) | ORAL | Status: DC

## 2015-11-19 MED ORDER — AMLODIPINE BESYLATE 10 MG PO TABS
10.0000 mg | ORAL_TABLET | Freq: Every day | ORAL | Status: DC
Start: 2015-11-19 — End: 2016-04-20

## 2015-11-19 NOTE — Telephone Encounter (Signed)
Refills sent for Metformin and amlodipine.

## 2016-04-12 ENCOUNTER — Encounter: Payer: Self-pay | Admitting: Physician Assistant

## 2016-04-12 ENCOUNTER — Ambulatory Visit (INDEPENDENT_AMBULATORY_CARE_PROVIDER_SITE_OTHER): Payer: PRIVATE HEALTH INSURANCE | Admitting: Physician Assistant

## 2016-04-12 ENCOUNTER — Telehealth: Payer: Self-pay | Admitting: Family Medicine

## 2016-04-12 VITALS — BP 138/100 | HR 81 | Temp 98.2°F | Resp 16 | Ht 60.75 in | Wt 169.1 lb

## 2016-04-12 DIAGNOSIS — L03115 Cellulitis of right lower limb: Secondary | ICD-10-CM

## 2016-04-12 DIAGNOSIS — I10 Essential (primary) hypertension: Secondary | ICD-10-CM

## 2016-04-12 DIAGNOSIS — L309 Dermatitis, unspecified: Secondary | ICD-10-CM | POA: Diagnosis not present

## 2016-04-12 MED ORDER — DOXYCYCLINE HYCLATE 100 MG PO CAPS: 100.0000 mg | ORAL_CAPSULE | Freq: Two times a day (BID) | ORAL | 0 refills | Status: DC

## 2016-04-12 MED ORDER — TRIAMCINOLONE ACETONIDE 0.1 % EX CREA: 1.0000 "application " | TOPICAL_CREAM | Freq: Two times a day (BID) | CUTANEOUS | 0 refills | Status: DC

## 2016-04-12 MED ORDER — MUPIROCIN CALCIUM 2 % EX CREA: 1.0000 "application " | TOPICAL_CREAM | Freq: Two times a day (BID) | CUTANEOUS | 0 refills | Status: DC

## 2016-04-12 MED FILL — TRIAMCINOLONE 0.1% CREAM: 0.1 | 30 days supply | Qty: 30 | Fill #0

## 2016-04-12 MED FILL — MUPIROCIN 2% OINTMENT: 2 | 30 days supply | Qty: 22 | Fill #0

## 2016-04-12 MED FILL — DOXYCYCLINE HYCLATE 100 MG: 100 | 7 days supply | Qty: 14 | Fill #0

## 2016-04-12 NOTE — Progress Notes (Signed)
Patient presents to clinic today c/o flare-up of eczema of R lower leg and ankle after use of a new skin oil. Endorses area is highly pruritic, scaling and dry. Denies fever, chills, malaise or fatigue. Has been keeping the area clean or dry. Had concerns of cellulitis due to history of diabetes so endorses going to UC yesterday. States she was not given anything for symptoms and told to follow-up with her PCP.  Past Medical History:  Diagnosis Date  . Allergy   . Anemia   . Arthritis   . Diabetes mellitus   . Eczema   . GERD (gastroesophageal reflux disease)   . Gout   . Hyperlipidemia   . Hypertension   . Neuromuscular disorder ([place])   . Polysubstance abuse    ETOH and Cocaine  . Seasonal allergies     Current Outpatient Prescriptions on File Prior to Visit  Medication Sig Dispense Refill  . allopurinol (ZYLOPRIM) 100 MG tablet Take 1 tablet (100 mg total) by mouth daily. 30 tablet 6  . amLODipine (NORVASC) 10 MG tablet Take 1 tablet (10 mg total) by mouth daily. 30 tablet 3  . esomeprazole (NEXIUM) 20 MG capsule Take 20 mg by mouth daily as needed (acid reflux).    . hydrocortisone cream 1 % Apply 1 application topically 2 (two) times daily as needed for itching.    . indomethacin (INDOCIN) 50 MG capsule Take 50 mg by mouth 2 (two) times daily.    Marland Kitchen lisinopril (PRINIVIL,ZESTRIL) 5 MG tablet Take 1 tablet (5 mg total) by mouth daily. Reported on 10/02/2015 90 tablet 3  . loratadine (CLARITIN) 10 MG tablet Take 1 tablet (10 mg total) by mouth daily. 90 tablet 3  . metFORMIN (GLUCOPHAGE) 1000 MG tablet Take 1 tablet (1,000 mg total) by mouth 2 (two) times daily. 60 tablet 3   No current facility-administered medications on file prior to visit.     Allergies  Allergen Reactions  . Cephalexin     REACTION: stomach cramps and hives  . Hydrocodone     headache  . Neomycin-Bacitracin Zn-Polymyx     REACTION: burning  . Sertraline Hives and Itching  . Adhesive [Tape] Hives and  Rash    Family History  Problem Relation Age of Onset  . Hypertension Father   . Diabetes Father   . Cancer Father   . Hypertension Other   . Diabetes Other   . Diabetes Mother   . Hypertension Mother   . Diabetes Maternal Grandmother   . Hypertension Maternal Grandmother   . Diabetes Paternal Grandfather     Social History   Social History  . Marital status: Single    Spouse name: N/A  . Number of children: N/A  . Years of education: N/A   Social History Main Topics  . Smoking status: Former Smoker    Packs/day: 0.50  . Smokeless tobacco: Former Systems developer    Quit date: 03/14/2013  . Alcohol use No     Comment: none since 03-14-14  . Drug use:     Types: Marijuana, Cocaine  . Sexual activity: Yes    Birth control/ protection: None   Other Topics Concern  . None   Social History Narrative  . None   Review of Systems - See HPI.  All other ROS are negative.  BP (!) 138/100 (BP Location: Right Arm, Patient Position: Sitting, Cuff Size: Large)   Pulse 81   Temp 98.2 F (36.8 C) (Oral)   Resp  16   Ht 5' 0.75" (1.543 m)   Wt 169 lb 2 oz (76.7 kg)   LMP 04/05/2016   SpO2 98%   BMI 32.22 kg/m   Physical Exam  Constitutional: She is well-developed, well-nourished, and in no distress.  HENT:  Head: Normocephalic and atraumatic.  Cardiovascular: Normal rate, regular rhythm, normal heart sounds and intact distal pulses.   Pulmonary/Chest: Effort normal and breath sounds normal. No respiratory distress. She has no wheezes. She has no rales. She exhibits no tenderness.  Neurological: She is alert.  Skin:     Psychiatric: Affect normal.  Vitals reviewed.  Assessment/Plan: 1. Eczema, unspecified type Rx kenalog to use as directed to help with atopy. Supportive measures reviewed. - triamcinolone cream (KENALOG) 0.1 %; Apply 1 application topically 2 (two) times daily.  Dispense: 30 g; Refill: 0  2. Cellulitis of right lower extremity Will start Doxycycline. Topical  Bactroban given. Supportive measures reviewed. Strict return precautions given. Patient with follow-up scheduled. - mupirocin cream (BACTROBAN) 2 %; Apply 1 application topically 2 (two) times daily.  Dispense: 15 g; Refill: 0  3. HYPERTENSION, MILD BP elevated today. Asymptomatic. Patient has not taken her BP medication in several days. Denies being out. She is to resume as directed. FU with PCP scheduled for recheck.   Leeanne Rio, PA-C

## 2016-04-12 NOTE — Patient Instructions (Signed)
Please keep skin clean and dry. Please apply topical creams as directed. Avoid harsh soaps on the area. OTC Sarna lotion and cold compresses will also help with itch.  Follow-up early next week as long as symptoms improve. If you notice any developing redness, tenderness or hardness of skin, please come see [place] ASAP as that is concerning for cellulitis.

## 2016-04-12 NOTE — Telephone Encounter (Signed)
[place] called in to speak with cma. She says that she just want to know if it okay to change the Rx for BACTROBAN cream to ointment instead? She says that they are currently out of the ointment.    Please advise    CB: [phone]

## 2016-04-12 NOTE — Telephone Encounter (Signed)
Rx were sent to [facility] at patient request this morning; verified that she did p/u all [3] Rx sent by provider, call Kristopher Oppenheim and informed that pt has already picked up scripts at our pharmacy, understood & agreed/SLS 11/07

## 2016-04-12 NOTE — Progress Notes (Signed)
Pre visit review using our clinic review tool, if applicable. No additional management support is needed unless otherwise documented below in the visit note/SLS  

## 2016-04-20 ENCOUNTER — Encounter: Payer: Self-pay | Admitting: Family Medicine

## 2016-04-20 ENCOUNTER — Ambulatory Visit (INDEPENDENT_AMBULATORY_CARE_PROVIDER_SITE_OTHER): Payer: PRIVATE HEALTH INSURANCE | Admitting: Family Medicine

## 2016-04-20 VITALS — BP 140/88 | HR 88 | Temp 97.9°F | Ht 61.0 in | Wt 165.8 lb

## 2016-04-20 DIAGNOSIS — E1165 Type 2 diabetes mellitus with hyperglycemia: Secondary | ICD-10-CM | POA: Diagnosis not present

## 2016-04-20 DIAGNOSIS — Z23 Encounter for immunization: Secondary | ICD-10-CM | POA: Diagnosis not present

## 2016-04-20 DIAGNOSIS — K219 Gastro-esophageal reflux disease without esophagitis: Secondary | ICD-10-CM

## 2016-04-20 DIAGNOSIS — J3089 Other allergic rhinitis: Secondary | ICD-10-CM | POA: Diagnosis not present

## 2016-04-20 DIAGNOSIS — M1A09X0 Idiopathic chronic gout, multiple sites, without tophus (tophi): Secondary | ICD-10-CM

## 2016-04-20 DIAGNOSIS — I10 Essential (primary) hypertension: Secondary | ICD-10-CM | POA: Diagnosis not present

## 2016-04-20 DIAGNOSIS — L309 Dermatitis, unspecified: Secondary | ICD-10-CM

## 2016-04-20 DIAGNOSIS — IMO0001 Reserved for inherently not codable concepts without codable children: Secondary | ICD-10-CM

## 2016-04-20 LAB — BASIC METABOLIC PANEL
BUN: 14 mg/dL (ref 6–23)
CHLORIDE: 105 meq/L (ref 96–112)
CO2: 26 mEq/L (ref 19–32)
Calcium: 9.4 mg/dL (ref 8.4–10.5)
Creatinine, Ser: 0.65 mg/dL (ref 0.40–1.20)
GFR: 125.21 mL/min (ref >60.00)
GLUCOSE: 105 mg/dL — AB (ref 70–99)
POTASSIUM: 3.7 meq/L (ref 3.5–5.1)
Sodium: 139 mEq/L (ref 135–145)

## 2016-04-20 LAB — HEMOGLOBIN A1C: Hgb A1c MFr Bld: 7.5 % — ABNORMAL HIGH (ref 4.6–6.5)

## 2016-04-20 MED ORDER — LORATADINE 10 MG PO TABS: 10.0000 mg | ORAL_TABLET | Freq: Every day | ORAL | 3 refills | Status: DC

## 2016-04-20 MED ORDER — ESOMEPRAZOLE MAGNESIUM 20 MG PO CPDR: 20.0000 mg | DELAYED_RELEASE_CAPSULE | Freq: Every day | ORAL | 6 refills | Status: DC | PRN

## 2016-04-20 MED ORDER — METFORMIN HCL 1000 MG PO TABS
1000.0000 mg | ORAL_TABLET | Freq: Two times a day (BID) | ORAL | 3 refills | Status: DC
Start: 2016-04-20 — End: 2017-05-03

## 2016-04-20 MED ORDER — TRIAMCINOLONE ACETONIDE 0.1 % EX CREA: 1.0000 "application " | TOPICAL_CREAM | Freq: Two times a day (BID) | CUTANEOUS | 2 refills | Status: DC

## 2016-04-20 MED ORDER — ALLOPURINOL 100 MG PO TABS: 100.0000 mg | ORAL_TABLET | Freq: Every day | ORAL | 3 refills | Status: DC

## 2016-04-20 MED ORDER — AMLODIPINE BESYLATE 10 MG PO TABS: 10.0000 mg | ORAL_TABLET | Freq: Every day | ORAL | 3 refills | Status: DC

## 2016-04-20 MED ORDER — INDOMETHACIN 50 MG PO CAPS: 50.0000 mg | ORAL_CAPSULE | Freq: Two times a day (BID) | ORAL | 3 refills | Status: DC

## 2016-04-20 MED ORDER — LISINOPRIL 5 MG PO TABS: 5.0000 mg | ORAL_TABLET | Freq: Every day | ORAL | 3 refills | Status: DC

## 2016-04-20 MED FILL — INDOMETHACIN 50 MG CAPSULE: 50 | 30 days supply | Qty: 60 | Fill #0

## 2016-04-20 MED FILL — ALLOPURINOL 100 MG TABLET: 100 | 30 days supply | Qty: 30 | Fill #0

## 2016-04-20 MED FILL — TRIAMCINOLONE 0.1% CREAM: 0.1 | 30 days supply | Qty: 80 | Fill #0

## 2016-04-20 MED FILL — metFORMIN HCL 1000 MG TABS: 1000 | 30 days supply | Qty: 60 | Fill #0

## 2016-04-20 MED FILL — LORATADINE 10 MG TABLET: 10 | 100 days supply | Qty: 100 | Fill #0

## 2016-04-20 NOTE — Progress Notes (Signed)
[place] at [facility] [address] [place], [place] [address] [id number] [phone]  Date:  04/20/2016   Name:  Deborah [name]   DOB:  1970/09/30   MRN:  [id number]  PCP:  Lamar Blinks, MD    Chief Complaint: Follow-up (Pt here to f/u on 04/12/16 visit. Pt was seen for Eczema on lower right leg. Pt states that the area has improved and would like to know if she should continue using the creams she was given.  Pt will need A1C checked today. Would like flu vaccine today. ) and Hyperlipidemia   History of Present Illness:  Deborah [name] is a 45 y.o. very pleasant female patient who presents with the following:  Seen here on 11/7 with eczema flare on her right lower leg and needs to follow-up on this concern and DM today today She has a history of DM, hyperlipidemia.  She is on metformin for her DM and her most recent A1c did show imporvement over prior.  She is on metformin 1,000 BID- stable on this dose.    She has triamcinolone 0.1 and mupirocin for her leg- this area is doing much better per her report.  She still has a hyperpigmented macule in the area of concern but the itching and weeping is resolved.  She reports a long history of eczema and that she has used triamcinolone over the years for this  She will have bad reflux every other night for the last 3 weeks. She is using nexium every other day.  nexium has worked the best for her of the acid reducers that she has tried.   She would like an rx for nexium as it may be less expensive for her than OTC  LMP 04/05/16  She is not really exercising formally, but she does walk a lot at her job. She admits that she often does not eat right.  She may only eat once a day and is not really sure what she should be eating. She has attended DM classes in the past but does not want to do these again.    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 Hyperlipidemia 10/05/2015  . Uncontrolled type 2 diabetes mellitus without complication, without long-term current use of insulin ([place]) 10/05/2015  . CELLULITIS AND ABSCESS OF ORAL SOFT TISSUES 07/29/2010  . VAGINAL PRURITUS 07/06/2010  . 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 LATERAL EPICONDYLITIS, LEFT 11/03/2009  . MUSCULOSKELETAL PAIN 10/01/2009  . DYSLIPIDEMIA 12/05/2008  . FATTY LIVER DISEASE 10/20/2008  . NUMMULAR ECZEMA 05/22/2008  . IMPETIGO 03/19/2008  . ALLERGIC REACTION 03/04/2008  . VAGINITIS, CANDIDAL 11/22/2007  . FEMALE INFERTILITY 05/01/2007  . ANEMIA, IRON DEFICIENCY 04/03/2007  . VAGINAL DISCHARGE 04/03/2007  . SMOKER 11/28/2006  . HYPERTENSION, MILD 11/28/2006  . HEMORRHOIDS, INTERNAL W/O COMPLICATION [id number]  . RHINITIS, ALLERGIC, DUE TO POLLEN 11/28/2006  . GERD 11/28/2006  . CONSTIPATION 11/28/2006  . DERMATITIS, ATOPIC 11/28/2006  . COCAINE ABUSE, HX OF 11/28/2006  .  Type II or unspecified type diabetes mellitus without mention of complication, not stated as uncontrolled 09/14/2006  . LIVER FUNCTION TESTS, ABNORMAL 09/14/2006  . LUMBAGO 03/23/2006  . GOUT 06/25/2004  . 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 Allergy   . Anemia   . Arthritis   . Diabetes mellitus   . Eczema   . GERD (gastroesophageal reflux disease)   . Gout   . Hyperlipidemia   . Hypertension   . Neuromuscular disorder ([place])   . Polysubstance abuse    ETOH and Cocaine  . Seasonal allergies     Past Surgical History:  Procedure Laterality Date  . HEMORRHOID SURGERY      Social History   Substance Use Topics  . Smoking status: Former Smoker    Packs/day: 0.50  . Smokeless tobacco: Former Systems developer    Quit date: 03/14/2013  . Alcohol use No     Comment: none since 03-14-14    Family History  Problem Relation Age of Onset  . Hypertension Father   . Diabetes Father   . Cancer Father   . Hypertension Other   . Diabetes Other   . Diabetes Mother   . Hypertension Mother   . Diabetes Maternal Grandmother   . Hypertension Maternal Grandmother   . Diabetes Paternal Grandfather     Allergies  Allergen Reactions  . Cephalexin     REACTION: stomach cramps and hives  . Hydrocodone     headache  . Neomycin-Bacitracin Zn-Polymyx     REACTION: burning  . Sertraline Hives and Itching  . Adhesive [Tape] Hives and Rash    Medication list has been reviewed and updated.  Current Outpatient Prescriptions on File Prior to Visit  Medication Sig Dispense Refill  . allopurinol (ZYLOPRIM) 100 MG tablet Take 1 tablet (100 mg total) by mouth daily. 30 tablet 6  . amLODipine (NORVASC) 10 MG tablet Take 1 tablet (10 mg total) by mouth daily. 30 tablet 3  . esomeprazole (NEXIUM) 20 MG capsule Take 20 mg by mouth daily as needed (acid reflux).    . indomethacin (INDOCIN) 50 MG capsule Take 50 mg by mouth 2 (two) times daily.    Marland Kitchen lisinopril (PRINIVIL,ZESTRIL) 5 MG tablet Take 1 tablet (5 mg total) by mouth daily. Reported on 10/02/2015 90 tablet 3  . loratadine (CLARITIN) 10 MG tablet Take 1 tablet (10 mg total) by mouth daily. 90 tablet 3  . metFORMIN (GLUCOPHAGE) 1000 MG tablet Take 1 tablet (1,000 mg total) by mouth 2 (two) times daily. 60 tablet 3  . mupirocin cream (BACTROBAN) 2 % Apply 1 application topically 2 (two) times daily. 15 g 0  . triamcinolone cream (KENALOG) 0.1 % Apply 1 application topically 2 (two) times daily. 30 g 0   No current facility-administered medications on file prior to visit.     Review of Systems:  As per HPI- otherwise negative.   Physical  Examination: Vitals:   04/20/16 1038  BP: 140/88  Pulse: 88  Temp: 97.9 F (36.6 C)   Vitals:   04/20/16 1038  Weight: 165 lb 12.8 oz (75.2 kg)  Height: 5\' 1"  (1.549 m)   Body mass index is 31.33 kg/m. Ideal Body Weight: Weight in (lb) to have BMI = 25: 132  GEN: WDWN, NAD, Non-toxic, A & O x 3, looks well, overweight HEENT: Atraumatic, Normocephalic. Neck supple. No masses, No LAD. Ears and Nose: No external deformity. CV: RRR, No M/G/R.  No JVD. No thrill. No extra heart sounds. PULM: CTA B, no wheezes, crackles, rhonchi. No retractions. No resp. distress. No accessory muscle use. ABD: S, NT, ND EXTR: No c/c/e NEURO Normal gait.  PSYCH: Normally interactive. Conversant. Not depressed or anxious appearing.  Calm demeanor.  Right lower leg displays a couple of old scars, and 2 adjacent hyperpigmented macules that she states have improved a lot with use of triamcinolone   Assessment and Plan: Immunization due - Plan: Tdap vaccine greater than or equal to 7yo IM  Idiopathic chronic gout of multiple sites without tophus - Plan: allopurinol (ZYLOPRIM) 100 MG tablet, indomethacin (INDOCIN) 50 MG capsule  Uncontrolled type 2 diabetes mellitus without complication, without long-term current use of insulin ([facility]) - Plan: lisinopril (PRINIVIL,ZESTRIL) 5 MG tablet, metFORMIN (GLUCOPHAGE) 1000 MG tablet, Hemoglobin [id number], Basic metabolic panel  Essential hypertension - Plan: amLODipine (NORVASC) 10 MG tablet, lisinopril (PRINIVIL,ZESTRIL) 5 MG tablet  Other allergic rhinitis - Plan: loratadine (CLARITIN) 10 MG tablet  Gastroesophageal reflux disease, esophagitis presence not specified - Plan: esomeprazole (NEXIUM) 20 MG capsule  Eczema, unspecified type - Plan: triamcinolone cream (KENALOG) 0.1 %  Here today to follow-up Flu shot and Tdap today Refilled her allopurinol for gout maintenance and indomethacin for prn use Metformin for her DM, check a1c and gave her a hand out from the  [place] clinic regarding dietary advice BP is under reasonable control today.   Will plan further follow- up pending labs.  It was good to see you today- your blood pressure looks ok, I will be in touch with your A1c and other labs asap You got your flu shot and tetanus shot today  Remember that you should NOT become pregnant while you are taking your lisinopril medication. If you suspect pregnancy stop this medication right away  Please look over the information I gave you about eating with diabetes!  You can use the nexium as needed for GERD- maybe take it for 2-3 weeks and then try stopping it and see if your symptoms return  You can used pulsed therapy for your eczema   Plan to recheck in 3-4 months    Signed Lamar Blinks, MD

## 2016-04-20 NOTE — Patient Instructions (Addendum)
It was good to see you today- your blood pressure looks ok, I will be in touch with your A1c and other labs asap You got your flu shot and tetanus shot today  Remember that you should NOT become pregnant while you are taking your lisinopril medication. If you suspect pregnancy stop this medication right away  Please look over the information I gave you about eating with diabetes!  You can use the nexium as needed for GERD- maybe take it for 2-3 weeks and then try stopping it and see if your symptoms return  You can used pulsed therapy for your eczema

## 2016-04-20 NOTE — Progress Notes (Signed)
Pre visit review using our clinic review tool, if applicable. No additional management support is needed unless otherwise documented below in the visit note. 

## 2016-06-28 ENCOUNTER — Telehealth: Payer: Self-pay | Admitting: Family Medicine

## 2016-06-28 NOTE — Telephone Encounter (Signed)
Caller name: Deborah [name] Relation to pt: self  Call back number: [place]  Reason for call: Pt states needs refill on Fluticasone Propionate Nasal Spray USP 50 mcg. Pt stated that had this prescription before and is needing the refill since having nasal congestion. Pt would like to have rx sent to pharmacy mentioned above.  Please advise.

## 2016-06-29 ENCOUNTER — Telehealth: Payer: Self-pay | Admitting: Emergency Medicine

## 2016-06-29 MED ORDER — FLUTICASONE PROPIONATE 50 MCG/ACT NA SUSP: 2.0000 | Freq: Every day | NASAL | 6 refills | Status: DC

## 2016-06-29 MED FILL — metFORMIN HCL 1000 MG TABS: 1000 | 30 days supply | Qty: 60 | Fill #1

## 2016-06-29 MED FILL — FLUTICASONE PROP 50 MCG SPR: 50 | 30 days supply | Qty: 16 | Fill #0

## 2016-06-29 NOTE — Telephone Encounter (Signed)
Received refill request from pt for Fluticasone Propionate Nasal Spray USP 50 mcg. Pt is requesting this for congestion and states that she has used in the past. Med not currently on pt's med list. Please advise.

## 2016-07-20 MED FILL — AMLODIPINE BESYLATE 10 MG T: 10 | 90 days supply | Qty: 90 | Fill #0

## 2016-07-20 MED FILL — LISINOPRIL 5 MG TAB: 5 | 90 days supply | Qty: 90 | Fill #0

## 2016-08-15 MED FILL — FLUTICASONE PROP 50 MCG SPR: 50 | 30 days supply | Qty: 16 | Fill #1

## 2016-08-29 MED FILL — LORATADINE 10 MG TABLET: 10 | 100 days supply | Qty: 100 | Fill #1

## 2016-08-29 MED FILL — ALLOPURINOL 100 MG TABLET: 100 | 30 days supply | Qty: 30 | Fill #1

## 2016-08-29 MED FILL — metFORMIN HCL 1000 MG TABS: 1000 | 30 days supply | Qty: 60 | Fill #2

## 2016-09-28 MED FILL — ALLOPURINOL 100 MG TABLET: 100 | 30 days supply | Qty: 30 | Fill #2

## 2016-10-17 MED FILL — INDOMETHACIN 50 MG CAPSULE: 50 | 30 days supply | Qty: 60 | Fill #1

## 2016-10-17 MED FILL — LISINOPRIL 5 MG TABLET: 5 | 90 days supply | Qty: 90 | Fill #1

## 2016-10-17 MED FILL — AMLODIPINE BESYLATE 10 MG T: 10 | 90 days supply | Qty: 90 | Fill #1

## 2016-10-17 MED FILL — AZITHROMYCIN 250 MG TABLET: 250 | 5 days supply | Qty: 6 | Fill #0

## 2016-10-18 ENCOUNTER — Emergency Department (HOSPITAL_COMMUNITY)
Admission: EM | Admit: 2016-10-18 | Discharge: 2016-10-18 | Disposition: A | Discharge status: Home / Self Care | Payer: Managed Care, Other (non HMO) | Attending: Emergency Medicine | Admitting: Emergency Medicine

## 2016-10-18 DIAGNOSIS — E119 Type 2 diabetes mellitus without complications: Secondary | ICD-10-CM | POA: Diagnosis not present

## 2016-10-18 DIAGNOSIS — I10 Essential (primary) hypertension: Secondary | ICD-10-CM | POA: Diagnosis not present

## 2016-10-18 DIAGNOSIS — Z7984 Long term (current) use of oral hypoglycemic drugs: Secondary | ICD-10-CM | POA: Diagnosis not present

## 2016-10-18 DIAGNOSIS — Z79899 Other long term (current) drug therapy: Secondary | ICD-10-CM | POA: Insufficient documentation

## 2016-10-18 DIAGNOSIS — Z87891 Personal history of nicotine dependence: Secondary | ICD-10-CM | POA: Insufficient documentation

## 2016-10-18 DIAGNOSIS — H5711 Ocular pain, right eye: Secondary | ICD-10-CM

## 2016-10-18 LAB — HM DIABETES EYE EXAM

## 2016-10-18 MED ORDER — KETOROLAC TROMETHAMINE 0.5 % OP SOLN: 1.0000 [drp] | Freq: Four times a day (QID) | OPHTHALMIC | 0 refills | Status: DC

## 2016-10-18 MED ORDER — KETOROLAC TROMETHAMINE 0.5 % OP SOLN
1.0000 [drp] | Freq: Once | OPHTHALMIC | Status: AC
  Administered 2016-10-18: 1 [drp] via OPHTHALMIC
  Filled 2016-10-18: qty 3

## 2016-10-18 MED ORDER — ERYTHROMYCIN 5 MG/GM OP OINT: TOPICAL_OINTMENT | OPHTHALMIC | 0 refills | Status: DC

## 2016-10-18 NOTE — ED Triage Notes (Addendum)
Patient states that she was getting her yearly eye exam and reports that the doctor got a wooden stick used for examstuck behind her right eye and her right eye popped out. Patient states she has a lot of pain and sees a black line.  Patient states it was recommended that she see a surgeon at 1415, but patient came to the ED instead.

## 2016-10-18 NOTE — Discharge Instructions (Signed)
Please follow up with Dr. Alanda Slim tomorrow morning regarding today's visit. Please use erythromycin ointment as prescribed. Please use ketorolac drops as needed for pain.  Please return for any new or worsening symptoms such as not being able to close eyelid, change in vision or severe pain.

## 2016-10-18 NOTE — ED Provider Notes (Signed)
[facility] DEPT Provider Note   CSN: [id number] Arrival date & time: 10/18/16  1332     History   Chief Complaint No chief complaint on file.   HPI Deborah [name] is a 46 y.o. female  Presents to ED for right eye pain starting this morning after her optometry exam. She reports her pain as throbbing burning, worsening, 8/10. She denies taking anything for her symptoms. She reports blurry vision and photophobia. She denies vision loss, double vision, previous eye pathology. Patient denies contact lens use.   Pt states she was seen by optometrist for yearly exam this morning. She reports her provider using a wooden Q tip to evert eyelid and the tip of the wooden Q tip fell behind eye and "popped" eye out of socket. She reports the provider went to get other providers nearby to help put eye back in place. She reports being referred to surgeon but states she no longer trusted her or anyone at the office and decided to come to ED instead.   She reports her symptoms of right eye pain, throbbing, blurriness, burning, and photophobia all started after her exam and has been worsening. She also reported seeing a black light across her right eye lid. She reports mild pain with movement of the eye, "especially upwards".   Pt had mentioned "Red patch" to right lateral side on corner last week and not sure it had improved or not. She also mentioned a flash of light intermittent for 1 month on the same side of "red patch" "on the corner."   The history is provided by the patient. No language interpreter was used.    Past Medical History:  Diagnosis Date  . Allergy   . Anemia   . Arthritis   . Diabetes mellitus   . Eczema   . GERD (gastroesophageal reflux disease)   . Gout   . Hyperlipidemia   . Hypertension   . Neuromuscular disorder ([place])   . Polysubstance abuse    ETOH and Cocaine  . Seasonal allergies     Patient Active Problem List   Diagnosis Date Noted  . Hyperlipidemia  10/05/2015  . Uncontrolled type 2 diabetes mellitus without complication, without long-term current use of insulin ([place]) 10/05/2015  . CELLULITIS AND ABSCESS OF ORAL SOFT TISSUES 07/29/2010  . VAGINAL PRURITUS 07/06/2010  . 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 LATERAL EPICONDYLITIS, LEFT 11/03/2009  . MUSCULOSKELETAL PAIN 10/01/2009  . DYSLIPIDEMIA 12/05/2008  . FATTY LIVER DISEASE 10/20/2008  . NUMMULAR ECZEMA 05/22/2008  . IMPETIGO 03/19/2008  . ALLERGIC REACTION 03/04/2008  . VAGINITIS, CANDIDAL 11/22/2007  . FEMALE INFERTILITY 05/01/2007  . ANEMIA, IRON DEFICIENCY 04/03/2007  . VAGINAL DISCHARGE 04/03/2007  . SMOKER 11/28/2006  . HYPERTENSION, MILD 11/28/2006  . HEMORRHOIDS, INTERNAL W/O COMPLICATION 10/93/2355  . RHINITIS, ALLERGIC, DUE TO POLLEN 11/28/2006  . GERD 11/28/2006  . CONSTIPATION 11/28/2006  . DERMATITIS, ATOPIC 11/28/2006  . COCAINE ABUSE, HX OF 11/28/2006  . Type II or unspecified type diabetes mellitus without mention of complication, not stated as uncontrolled 09/14/2006  . LIVER FUNCTION TESTS, ABNORMAL 09/14/2006  . LUMBAGO 03/23/2006  . GOUT 06/25/2004  . MORTON'S NEUROMA, 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    Past Surgical History:  Procedure Laterality Date  . HEMORRHOID SURGERY      OB History    Gravida Para Term Preterm AB Living   0         0   SAB TAB Ectopic Multiple Live Births  Home Medications    Prior to Admission medications   Medication Sig Start Date End Date Taking? Authorizing Provider  allopurinol (ZYLOPRIM) 100 MG tablet Take 1 tablet (100 mg total) by mouth daily. 04/20/16  Yes Copland, Gay Filler, MD  amLODipine (NORVASC) 10 MG tablet Take 1 tablet (10 mg total) by mouth daily. 04/20/16  Yes Copland, Gay Filler, MD  azithromycin (ZITHROMAX) 250 MG tablet Take 250-500 mg by mouth daily. Take 2 tablets on Day 1 Then Take 1 tablet on Days 2-5. 10/17/16  Yes [provider]  esomeprazole (NEXIUM) 20 MG capsule Take 1  capsule (20 mg total) by mouth daily as needed (acid reflux). 04/20/16  Yes Copland, Gay Filler, MD  lisinopril (PRINIVIL,ZESTRIL) 5 MG tablet Take 1 tablet (5 mg total) by mouth daily. Reported on 10/02/2015 04/20/16  Yes Copland, Gay Filler, MD  loratadine (CLARITIN) 10 MG tablet Take 1 tablet (10 mg total) by mouth daily. 04/20/16  Yes Copland, Gay Filler, MD  metFORMIN (GLUCOPHAGE) 1000 MG tablet Take 1 tablet (1,000 mg total) by mouth 2 (two) times daily. 04/20/16  Yes Copland, Gay Filler, MD  mupirocin cream (BACTROBAN) 2 % Apply 1 application topically 2 (two) times daily. 04/12/16  Yes Brunetta Jeans, PA-C  erythromycin ophthalmic ointment Place a 1/2 inch ribbon of ointment into the lower eyelid x 3 days 10/18/16   Bettey Costa, [place]  fluticasone [facility]) 50 MCG/ACT nasal spray Place 2 sprays into both nostrils daily. Patient taking differently: Place 2 sprays into both nostrils daily as needed for allergies.  06/29/16   Copland, Gay Filler, MD  indomethacin (INDOCIN) 50 MG capsule Take 1 capsule (50 mg total) by mouth 2 (two) times daily. Use as needed for gout flare 04/20/16   Copland, Gay Filler, MD  ketorolac (ACULAR) 0.5 % ophthalmic solution Place 1 drop into the right eye every 6 (six) hours. 10/18/16   Bettey Costa, PA  triamcinolone cream (KENALOG) 0.1 % Apply 1 application topically 2 (two) times daily. Use as needed for eczema Patient not taking: Reported on 10/18/2016 04/20/16   Copland, Gay Filler, MD    Family History Family History  Problem Relation Age of Onset  . Hypertension Father   . Diabetes Father   . Cancer Father   . Hypertension Other   . Diabetes Other   . Diabetes Mother   . Hypertension Mother   . Diabetes Maternal Grandmother   . Hypertension Maternal Grandmother   . Diabetes Paternal Grandfather     Social History Social History  Substance Use Topics  . Smoking status: Former Smoker    Packs/day: 0.50  . Smokeless tobacco: Former  Systems developer    Quit date: 03/14/2013  . Alcohol use No     Comment: none since 03-14-14     Allergies   Cephalexin; Hydrocodone; Neomycin-bacitracin zn-polymyx; Sertraline; and Adhesive [tape]   Review of Systems Review of Systems  Constitutional: Negative for chills and fever.  Eyes: Positive for photophobia and pain.  All other systems reviewed and are negative.    Physical Exam Updated Vital Signs BP 138/76 (BP Location: Right Arm)   Pulse 89   Temp 98.2 F (36.8 C) (Oral)   Resp 16   Ht 4\' 11"  (1.499 m)   Wt 77.7 kg   SpO2 96%   BMI 34.61 kg/m   Physical Exam  Constitutional: She appears well-developed and well-nourished.  Well appearing  HENT:  Head: Normocephalic and atraumatic.  Nose: Nose normal.  Eyes: Conjunctivae and EOM are normal. Pupils are equal, round, and reactive to light. Right eye exhibits no discharge. No foreign body present in the right eye. Left eye exhibits no discharge. No foreign body present in the left eye. Right conjunctiva is not injected. Right conjunctiva has no hemorrhage. Left conjunctiva is not injected. Left conjunctiva has no hemorrhage. No scleral icterus. Right eye exhibits normal extraocular motion and no nystagmus. Left eye exhibits normal extraocular motion and no nystagmus.  Fundoscopic exam:      The right eye shows no AV nicking, no exudate and no hemorrhage.       The left eye shows no AV nicking, no exudate and no hemorrhage.  Eyes dilated to 75mm. Recently dilated at optometrist office.   Minimal pain with movement of the right eye with upper gaze.  Patient able to close eyelids bilaterally without difficulty.   Neck: Normal range of motion.  Cardiovascular: Normal rate.   Pulmonary/Chest: Effort normal. No respiratory distress.  Normal work of breathing. No respiratory distress noted.   Abdominal: Soft.  Musculoskeletal: Normal range of motion.  Neurological: She is alert.  Skin: Skin is warm.  Psychiatric: She has a  normal mood and affect. Her behavior is normal.  Nursing note and vitals reviewed.    ED Treatments / Results  Labs (all labs ordered are listed, but only abnormal results are displayed) Labs Reviewed - No data to display  EKG  EKG Interpretation None       Radiology No results found.  Procedures Procedures (including critical care time)  Medications Ordered in ED Medications  ketorolac (ACULAR) 0.5 % ophthalmic solution 1 drop (not administered)     Initial Impression / Assessment and Plan / ED Course  I have reviewed the triage vital signs and the nursing notes.  Pertinent labs & imaging results that were available during my care of the patient were reviewed by me and considered in my medical decision making (see chart for details).    Patient here with eye pain, blurriness after optometry office visit. Patient able to close eyelid without difficulty. Visual acuity within normal limits. Pupils are equal and reactive to light. Extraocular motion normal. Funduscopic exam was normal on exam.  I consulted Dr. Alanda Slim, ophthalmologist, who recommended to have patient be seen in his office tomorrow and started on erythromycin ointment. He stated this was somewhat common and was less concerned after told she was able to close eyelids bilaterally without difficulty.  I spoke with patient and she agreed with assessment and plan. Patient verbally understood instructions and agreed. Patient is to see Dr. Alanda Slim tomorrow morning to start her on erythromycin ointment since she can. Patient be given ketorolac drops for pain relief. Reasons to immediately return to ED discussed. I feel she is safe for discharge at this time. She is in no apparent distress, afebrile, hemodynamically stable prior to discharge.    Final Clinical Impressions(s) / ED Diagnoses   Final diagnoses:  Pain of right eye    New Prescriptions New Prescriptions   ERYTHROMYCIN OPHTHALMIC OINTMENT    Place a 1/2  inch ribbon of ointment into the lower eyelid x 3 days   KETOROLAC (ACULAR) 0.5 % OPHTHALMIC SOLUTION    Place 1 drop into the right eye every 6 (six) hours.     Flonnie Overman [place], [place] 10/18/16 1847    Merrily Pew, MD 10/19/16 [phone]

## 2016-10-18 NOTE — ED Notes (Signed)
Bed: BW62 Expected date:  Expected time:  Means of arrival:  Comments: triage

## 2016-10-20 MED FILL — KETOROLAC 0.5% OPHTH SOLN: 0.5 | 50 days supply | Qty: 10 | Fill #0

## 2016-10-20 MED FILL — ERYTHROMYCIN EYE OINTMENT: 5 | 7 days supply | Qty: 4 | Fill #0

## 2016-10-26 ENCOUNTER — Ambulatory Visit (INDEPENDENT_AMBULATORY_CARE_PROVIDER_SITE_OTHER): Payer: Managed Care, Other (non HMO) | Admitting: Family Medicine

## 2016-10-26 VITALS — BP 126/80 | HR 101 | Temp 98.3°F | Ht 61.0 in | Wt 168.8 lb

## 2016-10-26 DIAGNOSIS — IMO0001 Reserved for inherently not codable concepts without codable children: Secondary | ICD-10-CM

## 2016-10-26 DIAGNOSIS — F4320 Adjustment disorder, unspecified: Secondary | ICD-10-CM

## 2016-10-26 DIAGNOSIS — S39012A Strain of muscle, fascia and tendon of lower back, initial encounter: Secondary | ICD-10-CM

## 2016-10-26 DIAGNOSIS — I10 Essential (primary) hypertension: Secondary | ICD-10-CM

## 2016-10-26 DIAGNOSIS — E1165 Type 2 diabetes mellitus with hyperglycemia: Secondary | ICD-10-CM

## 2016-10-26 DIAGNOSIS — F4321 Adjustment disorder with depressed mood: Secondary | ICD-10-CM

## 2016-10-26 LAB — COMPREHENSIVE METABOLIC PANEL
ALT: 15 U/L (ref 0–35)
AST: 19 U/L (ref 0–37)
Albumin: 4.4 g/dL (ref 3.5–5.2)
Alkaline Phosphatase: 103 U/L (ref 39–117)
BUN: 13 mg/dL (ref 6–23)
CALCIUM: 9.5 mg/dL (ref 8.4–10.5)
CHLORIDE: 103 meq/L (ref 96–112)
CO2: 24 mEq/L (ref 19–32)
CREATININE: 0.67 mg/dL (ref 0.40–1.20)
GFR: 120.65 mL/min (ref >60.00)
Glucose, Bld: 182 mg/dL — ABNORMAL HIGH (ref 70–99)
Potassium: 3.5 mEq/L (ref 3.5–5.1)
SODIUM: 137 meq/L (ref 135–145)
Total Bilirubin: 0.3 mg/dL (ref 0.2–1.2)
Total Protein: 7.6 g/dL (ref 6.0–8.3)

## 2016-10-26 LAB — HEMOGLOBIN A1C: HEMOGLOBIN A1C: 9.4 % — AB (ref 4.6–6.5)

## 2016-10-26 MED ORDER — MELOXICAM 7.5 MG PO TABS: 7.5000 mg | ORAL_TABLET | Freq: Every day | ORAL | 0 refills | Status: DC

## 2016-10-26 MED FILL — MELOXICAM 7.5 MG TABLET: 7.5 | 30 days supply | Qty: 30 | Fill #0
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
## 2016-10-28 ENCOUNTER — Encounter: Payer: Self-pay | Admitting: Family Medicine

## 2016-10-28 MED FILL — metFORMIN HCL 1000 MG TABS: 1000 | 30 days supply | Qty: 60 | Fill #3

## 2016-11-07 MED FILL — ALLOPURINOL 100 MG TABLET: 100 | 30 days supply | Qty: 30 | Fill #3

## 2016-11-07 MED FILL — AZITHROMYCIN 250 MG TABLET: 250 | 5 days supply | Qty: 6 | Fill #0

## 2016-11-07 MED FILL — HYDROCODON-APAP 5-325: 5-325 | 2 days supply | Qty: 16 | Fill #0

## 2016-11-07 MED FILL — IBUPROFEN 600 MG TABLET: 600 | 5 days supply | Qty: 30 | Fill #0

## 2016-12-06 ENCOUNTER — Other Ambulatory Visit: Payer: Self-pay | Admitting: Family Medicine

## 2016-12-06 DIAGNOSIS — K219 Gastro-esophageal reflux disease without esophagitis: Secondary | ICD-10-CM

## 2016-12-06 MED FILL — metFORMIN HCL 1000 MG TABS: 1000 | 30 days supply | Qty: 60 | Fill #4

## 2016-12-06 MED FILL — ALLOPURINOL 100 MG TABLET: 100 | 30 days supply | Qty: 30 | Fill #4

## 2016-12-06 NOTE — Telephone Encounter (Signed)
Caller name: Relation to PJ:ASNK Call back [phone] Pharmacy:[facility]  Reason for call: pt is needing rx for nexium, and also is needing a meter states Dr.Copland informed her of her A1C is high, would like a call back to let her know when the rx is ready and also when to come get a meter.

## 2016-12-08 MED ORDER — GLUCOSE BLOOD VI STRP: ORAL_STRIP | 12 refills | Status: DC

## 2016-12-08 MED ORDER — ACCU-CHEK SOFT TOUCH LANCETS MISC: 12 refills | Status: DC

## 2016-12-08 MED ORDER — ESOMEPRAZOLE MAGNESIUM 20 MG PO CPDR: 20.0000 mg | DELAYED_RELEASE_CAPSULE | Freq: Every day | ORAL | 6 refills | Status: DC | PRN

## 2016-12-08 MED ORDER — ESOMEPRAZOLE MAGNESIUM 20 MG PO CPDR: 20.0000 mg | DELAYED_RELEASE_CAPSULE | Freq: Every day | ORAL | 12 refills | Status: DC | PRN

## 2016-12-08 MED ORDER — BLOOD GLUCOSE METER KIT
PACK | 0 refills | Status: DC
Start: 2016-12-08 — End: 2019-04-01

## 2016-12-08 NOTE — Telephone Encounter (Signed)
Called her- she needs a meter.  Will place up front for her.  Also refilled her nexium

## 2016-12-09 MED FILL — ESOMEPRAZOLE MAGNESIUM 20 M: 20 | 30 days supply | Qty: 30 | Fill #0

## 2016-12-09 MED FILL — ACCU-CHEK GUIDE TEST STRIP: 25 days supply | Qty: 50 | Fill #0

## 2016-12-29 MED FILL — LORATADINE 10 MG TABLET: 10 | 100 days supply | Qty: 100 | Fill #2

## 2017-01-04 MED FILL — ALLOPURINOL 100 MG TABLET: 100 | 30 days supply | Qty: 30 | Fill #5

## 2017-01-04 MED FILL — metFORMIN HCL 1000 MG TABS: 1000 | 30 days supply | Qty: 60 | Fill #5

## 2017-01-16 MED FILL — LISINOPRIL 5 MG TAB: 5 | 90 days supply | Qty: 90 | Fill #2

## 2017-01-16 MED FILL — AMLODIPINE BESYLATE 10 MG T: 10 | 90 days supply | Qty: 90 | Fill #2

## 2017-02-09 MED FILL — ALLOPURINOL 100 MG TABLET: 100 | 30 days supply | Qty: 30 | Fill #6

## 2017-02-09 MED FILL — ESOMEPRAZOLE MAGNESIUM 20 M: 20 | 30 days supply | Qty: 30 | Fill #1

## 2017-02-09 MED FILL — metFORMIN HCL 1000 MG TABS: 1000 | 30 days supply | Qty: 60 | Fill #6

## 2017-03-13 MED FILL — ALLOPURINOL 100 MG TABS: 100 | 30 days supply | Qty: 30 | Fill #7

## 2017-03-13 MED FILL — metFORMIN HCL 1000 MG TABS: 1000 | 30 days supply | Qty: 60 | Fill #7

## 2017-04-06 MED FILL — ALLOPURINOL 100 MG TABS: 100 | 30 days supply | Qty: 30 | Fill #8

## 2017-04-06 MED FILL — ESOMEPRAZOLE MAGNESIUM 20 M: 20 | 30 days supply | Qty: 30 | Fill #2

## 2017-04-06 MED FILL — LORATADINE 10 MG TABLET: 10 | 100 days supply | Qty: 100 | Fill #3

## 2017-04-06 MED FILL — FLUTICASONE PROP 50 MCG SPR: 50 | 30 days supply | Qty: 16 | Fill #2

## 2017-04-06 MED FILL — metFORMIN HCL 1000 MG TABS: 1000 | 30 days supply | Qty: 60 | Fill #8

## 2017-04-06 MED FILL — TRIAMCINOLONE 0.1% CREAM: 0.1 | 30 days supply | Qty: 80 | Fill #1

## 2017-04-24 ENCOUNTER — Other Ambulatory Visit: Payer: Self-pay | Admitting: Family Medicine

## 2017-04-24 DIAGNOSIS — I10 Essential (primary) hypertension: Secondary | ICD-10-CM

## 2017-04-24 DIAGNOSIS — E1165 Type 2 diabetes mellitus with hyperglycemia: Principal | ICD-10-CM

## 2017-04-24 DIAGNOSIS — IMO0001 Reserved for inherently not codable concepts without codable children: Secondary | ICD-10-CM

## 2017-04-25 NOTE — Telephone Encounter (Signed)
Pt came in office stating is needing her refill ASAP since pt is out of meds. Please advise ASAP.   Send rx to [facility]. Pt tel [phone].

## 2017-04-26 MED FILL — AMLODIPINE BESYLATE 10 MG T: 10 | 90 days supply | Qty: 90 | Fill #0

## 2017-04-26 MED FILL — LISINOPRIL 5 MG TABLET: 5 | 90 days supply | Qty: 90 | Fill #0

## 2017-04-26 NOTE — Telephone Encounter (Signed)
Copied from [place] [phone]. Topic: Quick Communication - See Telephone Encounter >> Apr 26, 2017 10:52 AM Synthia Innocent wrote: CRM for notification. See Telephone encounter for:  Requesting refill on lisinopril 5mg  and amlodipine 10mg , states pharmacy sent request on Monday. Has not heard anything, can a call be placed to patient once sent 04/26/17.

## 2017-05-02 NOTE — Progress Notes (Signed)
[facility] [address], [place], [place] [address] [phone]  Date:  05/03/2017   Name:  Deborah [name]   DOB:  1970-11-09   MRN:  [id number]  PCP:  Darreld Mclean, MD    Chief Complaint: Follow-up (Pt here for med f/u visit. Flu vaccine given today. )   History of Present Illness:  Deborah [name] is a 46 y.o. very pleasant female patient who presents with the following:  Follow-up visit today History of DM, HTN, hyperlipidemia, transaminitis Flu: given today Foot exam due Pneumonia vaccine given in 2008  Last visit here in May, follow-up notes as follows.  Of note at that visit she had recently lost her brother to violence  A1c is much too high Gave pt a call. She admits that she is only taking her metformin once a day and is not exercising or eating right She also need a refill of her metformin and asks me to call the pharmacy so they will get this rx ready for her.  I will do this for her Plan to repeat her A1c in 3 months She wonders if we see any explanation for her leg cramps.  Noted that her K is on the low side- will have her try a higher K diet. She asks me to send her a list of high potassium foods and also some dietary guidelines. She does not have Internet at home   She notes that she is taking her metformin twice a day "like I am supposed to."  However she notes that this medication may be causing headaches. She has noted headaches for about a month She notes that her BP is high as well- she does not check it at home She is on lisinopril 5 mg for her BP, norvasc 10 mg She is using metformin 1000 BID currently   Pt also notes that she was on amitriptyline in the past for numbness in her fingertips attributed to peripheral neuropathy.  This has started to bother her again recently and she would like to go back on the amitriptyline- this may also help with her headaches BP Readings from Last 3  Encounters:  05/03/17 (!) 152/90  10/26/16 126/80  10/18/16 (!) 149/91    Lab Results  Component Value Date   HGBA1C 7.5 (H) 05/03/2017     Chemistry      Component Value Date/Time   NA 139 05/03/2017 0944   K 4.1 05/03/2017 0944   CL 103 05/03/2017 0944   CO2 27 05/03/2017 0944   BUN 14 05/03/2017 0944   CREATININE 0.69 05/03/2017 0944      Component Value Date/Time   CALCIUM 9.3 05/03/2017 0944   ALKPHOS 103 10/26/2016 1034   AST 19 10/26/2016 1034   ALT 15 10/26/2016 1034   BILITOT 0.3 10/26/2016 1034      Need to repeat A1c and BMP today ? Is she on cholesterol meds?  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 Uncontrolled type 2 diabetes mellitus without complication, without long-term current use of insulin ([place]) 10/05/2015  . 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 LATERAL EPICONDYLITIS, LEFT 11/03/2009  . MUSCULOSKELETAL PAIN 10/01/2009  . DYSLIPIDEMIA 12/05/2008  . FATTY LIVER DISEASE 10/20/2008  .  NUMMULAR ECZEMA 05/22/2008  . FEMALE INFERTILITY 05/01/2007  . ANEMIA, IRON DEFICIENCY 04/03/2007  . SMOKER 11/28/2006  . HYPERTENSION, MILD 11/28/2006  . HEMORRHOIDS, INTERNAL W/O COMPLICATION 09/98/3382  . RHINITIS, ALLERGIC, DUE TO POLLEN 11/28/2006  . GERD 11/28/2006  . DERMATITIS, ATOPIC 11/28/2006  . COCAINE ABUSE, HX OF 11/28/2006  . LIVER FUNCTION TESTS, ABNORMAL 09/14/2006  . GOUT 06/25/2004  . 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 Allergy   . Anemia   . Arthritis   . Diabetes mellitus   . Eczema   . GERD (gastroesophageal reflux disease)   . Gout   . Hyperlipidemia   . Hypertension   . Neuromuscular disorder ([place])   . Polysubstance abuse    ETOH and Cocaine  . Seasonal allergies     Past Surgical History:  Procedure Laterality Date  .  HEMORRHOID SURGERY      Social History   Tobacco Use  . Smoking status: Former Smoker    Packs/day: 0.50  . Smokeless tobacco: Former Systems developer    Quit date: 03/14/2013  Substance Use Topics  . Alcohol use: No    Comment: none since 03-14-14  . Drug use: Yes    Types: Marijuana, Cocaine    Family History  Problem Relation Age of Onset  . Hypertension Father   . Diabetes Father   . Cancer Father   . Hypertension Other   . Diabetes Other   . Diabetes Mother   . Hypertension Mother   . Diabetes Maternal Grandmother   . Hypertension Maternal Grandmother   . Diabetes Paternal Grandfather     Allergies  Allergen Reactions  . Cephalexin     REACTION: stomach cramps and hives  . Hydrocodone     headache  . Neomycin-Bacitracin Zn-Polymyx     REACTION: burning  . Sertraline Hives and Itching  . Adhesive [Tape] Hives and Rash    Medication list has been reviewed and updated.  Current Outpatient Medications on File Prior to Visit  Medication Sig Dispense Refill  . amLODipine (NORVASC) 10 MG tablet TAKE 1 TABLET (10 MG TOTAL) BY MOUTH DAILY. 90 tablet 3  . blood glucose meter kit and supplies Dispense based on patient and insurance preference. Use 1-2x daily as desired to monitor glucose (FOR ICD-9 250.00, 250.01). 1 each 0  . erythromycin ophthalmic ointment Place a 1/2 inch ribbon of ointment into the lower eyelid x 3 days 1 g 0  . fluticasone (FLONASE) 50 MCG/ACT nasal spray Place 2 sprays into both nostrils daily. (Patient taking differently: Place 2 sprays into both nostrils daily as needed for allergies. ) 16 g 6  . glucose blood (ACCU-CHEK GUIDE) test strip Use as instructed 100 each 12  . ketorolac (ACULAR) 0.5 % ophthalmic solution Place 1 drop into the right eye every 6 (six) hours. 3 mL 0  . Lancets (ACCU-CHEK SOFT TOUCH) lancets Use as instructed 100 each 12  . loratadine (CLARITIN) 10 MG tablet Take 1 tablet (10 mg total) by mouth daily. 90 tablet 3  . mupirocin cream  (BACTROBAN) 2 % Apply 1 application topically 2 (two) times daily. 15 g 0  . triamcinolone cream (KENALOG) 0.1 % Apply 1 application topically 2 (two) times daily. Use as needed for eczema 80 g 2   No current facility-administered medications on file prior to visit.     Review of Systems:  As per HPI- otherwise negative.   Physical Examination: Vitals:  05/03/17 0903  BP: (!) 152/90  Pulse: 99  Temp: 98.2 F (36.8 C)  SpO2: 98%   Vitals:   05/03/17 0903  Weight: 172 lb 6.4 oz (78.2 kg)  Height: 5' 1" (1.549 m)   Body mass index is 32.57 kg/m. Ideal Body Weight: Weight in (lb) to have BMI = 25: 132  GEN: WDWN, NAD, Non-toxic, A & O x 3, obese, otherwise looks well HEENT: Atraumatic, Normocephalic. Neck supple. No masses, No LAD. Ears and Nose: No external deformity. CV: RRR, No M/G/R. No JVD. No thrill. No extra heart sounds. PULM: CTA B, no wheezes, crackles, rhonchi. No retractions. No resp. distress. No accessory muscle use. ABD: S, NT, ND, +BS. No rebound. No HSM. EXTR: No c/c/e NEURO Normal gait.  PSYCH: Normally interactive. Conversant. Not depressed or anxious appearing.  Calm demeanor.    Assessment and Plan: Uncontrolled type 2 diabetes mellitus without complication, without long-term current use of insulin ([place]) - Plan: Basic metabolic panel, Hemoglobin A1c, lisinopril (PRINIVIL,ZESTRIL) 10 MG tablet, metFORMIN (GLUCOPHAGE) 1000 MG tablet  Essential hypertension - Plan: Basic metabolic panel, lisinopril (PRINIVIL,ZESTRIL) 10 MG tablet  Mixed hyperlipidemia  Immunization due - Plan: Flu Vaccine QUAD 6+ mos PF IM (Fluarix Quad PF)  Idiopathic chronic gout of multiple sites without tophus - Plan: allopurinol (ZYLOPRIM) 100 MG tablet, indomethacin (INDOCIN) 50 MG capsule, Uric acid  Polyneuropathy associated with underlying disease ([place]) - Plan: amitriptyline (ELAVIL) 10 MG tablet  Gastroesophageal reflux disease, esophagitis presence not specified - Plan:  esomeprazole (NEXIUM) 20 MG capsule  Follow-up visit today Will check on her DM with A1c today Refilled several medications Increased her lisinopril to 10 mg, continue amlodipine Restart on 10 mg of amitriptyline for her nerve pain Will plan further follow- up pending labs.    Signed Lamar Blinks, MD Received her labs- letter to pt a1c is down from 9.4 at last labs  Your labs are good!  A1c is much better- down from 9.4% at last check, which is great.  Metabolic profile and uric acid level also are ok Please let me know if the increased dose of blood pressure med and the addition of amitriptyline help with your symptoms.  Alert me if you are not doing better! Otherwise please come and see me in 4 months.    Results for orders placed or performed in visit on 15/40/08  Basic metabolic panel  Result Value Ref Range   Sodium 139 135 - 145 mEq/L   Potassium 4.1 3.5 - 5.1 mEq/L   Chloride 103 96 - 112 mEq/L   CO2 27 19 - 32 mEq/L   Glucose, Bld 159 (H) 70 - 99 mg/dL   BUN 14 6 - 23 mg/dL   Creatinine, Ser 0.69 0.40 - 1.20 mg/dL   Calcium 9.3 8.4 - 10.5 mg/dL   GFR 116.37 >60.00 mL/min  Hemoglobin A1c  Result Value Ref Range   Hgb A1c MFr Bld 7.5 (H) 4.6 - 6.5 %  Uric acid  Result Value Ref Range   Uric Acid, Serum 5.2 2.4 - 7.0 mg/dL

## 2017-05-03 ENCOUNTER — Encounter: Payer: Self-pay | Admitting: Family Medicine

## 2017-05-03 ENCOUNTER — Ambulatory Visit: Payer: Managed Care, Other (non HMO) | Admitting: Family Medicine

## 2017-05-03 VITALS — BP 152/90 | HR 99 | Temp 98.2°F | Ht 61.0 in | Wt 172.4 lb

## 2017-05-03 DIAGNOSIS — Z23 Encounter for immunization: Secondary | ICD-10-CM | POA: Diagnosis not present

## 2017-05-03 DIAGNOSIS — G63 Polyneuropathy in diseases classified elsewhere: Secondary | ICD-10-CM

## 2017-05-03 DIAGNOSIS — E1165 Type 2 diabetes mellitus with hyperglycemia: Secondary | ICD-10-CM

## 2017-05-03 DIAGNOSIS — E782 Mixed hyperlipidemia: Secondary | ICD-10-CM | POA: Diagnosis not present

## 2017-05-03 DIAGNOSIS — I10 Essential (primary) hypertension: Secondary | ICD-10-CM | POA: Diagnosis not present

## 2017-05-03 DIAGNOSIS — M1A09X0 Idiopathic chronic gout, multiple sites, without tophus (tophi): Secondary | ICD-10-CM | POA: Diagnosis not present

## 2017-05-03 DIAGNOSIS — K219 Gastro-esophageal reflux disease without esophagitis: Secondary | ICD-10-CM

## 2017-05-03 DIAGNOSIS — IMO0001 Reserved for inherently not codable concepts without codable children: Secondary | ICD-10-CM

## 2017-05-03 LAB — BASIC METABOLIC PANEL
BUN: 14 mg/dL (ref 6–23)
CALCIUM: 9.3 mg/dL (ref 8.4–10.5)
CO2: 27 meq/L (ref 19–32)
CREATININE: 0.69 mg/dL (ref 0.40–1.20)
Chloride: 103 mEq/L (ref 96–112)
GFR: 116.37 mL/min (ref >60.00)
Glucose, Bld: 159 mg/dL — ABNORMAL HIGH (ref 70–99)
Potassium: 4.1 mEq/L (ref 3.5–5.1)
Sodium: 139 mEq/L (ref 135–145)

## 2017-05-03 LAB — URIC ACID: URIC ACID, SERUM: 5.2 mg/dL (ref 2.4–7.0)

## 2017-05-03 LAB — HEMOGLOBIN A1C: Hgb A1c MFr Bld: 7.5 % — ABNORMAL HIGH (ref 4.6–6.5)

## 2017-05-03 MED ORDER — AMITRIPTYLINE HCL 10 MG PO TABS: 10.0000 mg | ORAL_TABLET | Freq: Every day | ORAL | 6 refills | Status: DC

## 2017-05-03 MED ORDER — ESOMEPRAZOLE MAGNESIUM 20 MG PO CPDR: 20.0000 mg | DELAYED_RELEASE_CAPSULE | Freq: Every day | ORAL | 12 refills | Status: DC | PRN

## 2017-05-03 MED ORDER — ALLOPURINOL 100 MG PO TABS
100.0000 mg | ORAL_TABLET | Freq: Every day | ORAL | 3 refills | Status: DC
Start: 2017-05-03 — End: 2018-05-21

## 2017-05-03 MED ORDER — METFORMIN HCL 1000 MG PO TABS: 1000.0000 mg | ORAL_TABLET | Freq: Two times a day (BID) | ORAL | 3 refills | Status: DC

## 2017-05-03 MED ORDER — LISINOPRIL 10 MG PO TABS: 10.0000 mg | ORAL_TABLET | Freq: Every day | ORAL | 3 refills | Status: DC

## 2017-05-03 MED ORDER — INDOMETHACIN 50 MG PO CAPS: 50.0000 mg | ORAL_CAPSULE | Freq: Two times a day (BID) | ORAL | 3 refills | Status: DC

## 2017-05-03 MED FILL — metFORMIN HCL 1000 MG TABS: 1000 | 90 days supply | Qty: 180 | Fill #0

## 2017-05-03 MED FILL — LISINOPRIL 10 MG TABLET: 10 | 90 days supply | Qty: 90 | Fill #0

## 2017-05-03 MED FILL — ALLOPURINOL 100 MG TABS: 100 | 90 days supply | Qty: 90 | Fill #0

## 2017-05-03 MED FILL — INDOMETHACIN 50 MG CAPSULE: 50 | 30 days supply | Qty: 60 | Fill #0

## 2017-05-03 MED FILL — ESOMEPRAZOLE MAGNESIUM 20 M: 20 | 30 days supply | Qty: 30 | Fill #0

## 2017-05-03 MED FILL — AMITRIPTYLINE HCL 10 MG TAB: 10 | 30 days supply | Qty: 30 | Fill #0

## 2017-05-03 NOTE — Patient Instructions (Signed)
It was good to see you today-  I will be in touch with your labs asap Let's increase your lisinopril to 10 mg a day- you can double up on your 5mg  tablets until you finish up what you have I sent an rx for amitriptyline 10 mg for you to take for the tingling in your fingers.    We will plan to discuss any other adjustments pending your lab results!    Take care

## 2017-06-05 MED FILL — AMITRIPTYLINE HCL 10 MG TAB: 10 | 30 days supply | Qty: 30 | Fill #1

## 2017-06-05 MED FILL — ESOMEPRAZOLE MAGNESIUM 20 M: 20 | 30 days supply | Qty: 30 | Fill #1

## 2017-07-04 MED FILL — AMITRIPTYLINE HCL 10 MG TAB: 10 | 30 days supply | Qty: 30 | Fill #2

## 2017-07-10 ENCOUNTER — Telehealth: Payer: Self-pay | Admitting: Family Medicine

## 2017-07-10 DIAGNOSIS — K219 Gastro-esophageal reflux disease without esophagitis: Secondary | ICD-10-CM

## 2017-07-10 NOTE — Telephone Encounter (Signed)
Copied from [place] [id number]. Topic: Quick Communication - See Telephone Encounter >> Jul 10, 2017 11:17 AM Conception Chancy, NT wrote: CRM for notification. See Telephone encounter for:  07/10/17.  Pt is calling and requesting her Esomeprazole 20mg  become a higher dose. States she still still waking up in the middle of the night with heart burn and feels the 20mg  is not helping. She also said that Protonix and Zantac does not work for her.   [facility].

## 2017-07-12 MED ORDER — ESOMEPRAZOLE MAGNESIUM 40 MG PO CPDR: 40.0000 mg | DELAYED_RELEASE_CAPSULE | Freq: Every day | ORAL | 6 refills | Status: DC | PRN

## 2017-07-12 NOTE — Telephone Encounter (Signed)
Increased her dose to 40 mg a day, but called and LMOM. Please come and see me so we can discuss uncontrolled GERD

## 2017-07-12 NOTE — Telephone Encounter (Signed)
Pt has been scheduled.  °

## 2017-07-19 ENCOUNTER — Other Ambulatory Visit: Payer: Self-pay | Admitting: Family Medicine

## 2017-07-19 DIAGNOSIS — J3089 Other allergic rhinitis: Secondary | ICD-10-CM

## 2017-07-19 MED FILL — AMLODIPINE BESYLATE 10 MG T: 10 | 90 days supply | Qty: 90 | Fill #1

## 2017-07-21 NOTE — Telephone Encounter (Signed)
Received refill request for loratadine (CLARITIN) 10 MG tablet. Last office 05/03/17 visit and last refill 04/20/16. Refill sent to pharmacy.

## 2017-07-24 ENCOUNTER — Ambulatory Visit: Payer: Self-pay | Admitting: Family Medicine

## 2017-08-09 MED FILL — ALLOPURINOL 100 MG TABS: 100 | 30 days supply | Qty: 30 | Fill #1

## 2017-08-25 MED FILL — AMITRIPTYLINE HCL 10 MG TAB: 10 | 30 days supply | Qty: 30 | Fill #3

## 2017-08-25 MED FILL — metFORMIN HCL 1000 MG TABS: 1000 | 30 days supply | Qty: 60 | Fill #1

## 2017-09-11 MED FILL — ALLOPURINOL 100 MG TABS: 100 | 30 days supply | Qty: 30 | Fill #2

## 2017-09-11 MED FILL — LISINOPRIL 10 MG TABLET: 10 | 90 days supply | Qty: 90 | Fill #1

## 2017-10-03 MED FILL — metFORMIN HCL 1000 MG TABS: 1000 | 30 days supply | Qty: 60 | Fill #2

## 2017-10-04 ENCOUNTER — Emergency Department (HOSPITAL_COMMUNITY): Payer: Managed Care, Other (non HMO)

## 2017-10-04 ENCOUNTER — Other Ambulatory Visit: Payer: Self-pay

## 2017-10-04 ENCOUNTER — Encounter (HOSPITAL_COMMUNITY): Payer: Self-pay | Admitting: Emergency Medicine

## 2017-10-04 ENCOUNTER — Emergency Department (HOSPITAL_COMMUNITY)
Admission: EM | Admit: 2017-10-04 | Discharge: 2017-10-05 | Disposition: A | Discharge status: Home / Self Care | Payer: Managed Care, Other (non HMO) | Attending: Emergency Medicine | Admitting: Emergency Medicine

## 2017-10-04 DIAGNOSIS — R05 Cough: Secondary | ICD-10-CM

## 2017-10-04 DIAGNOSIS — J019 Acute sinusitis, unspecified: Secondary | ICD-10-CM | POA: Diagnosis not present

## 2017-10-04 DIAGNOSIS — I10 Essential (primary) hypertension: Secondary | ICD-10-CM | POA: Insufficient documentation

## 2017-10-04 DIAGNOSIS — E119 Type 2 diabetes mellitus without complications: Secondary | ICD-10-CM | POA: Insufficient documentation

## 2017-10-04 DIAGNOSIS — Z79899 Other long term (current) drug therapy: Secondary | ICD-10-CM | POA: Insufficient documentation

## 2017-10-04 DIAGNOSIS — Z7984 Long term (current) use of oral hypoglycemic drugs: Secondary | ICD-10-CM | POA: Diagnosis not present

## 2017-10-04 DIAGNOSIS — R059 Cough, unspecified: Secondary | ICD-10-CM

## 2017-10-04 DIAGNOSIS — Z87891 Personal history of nicotine dependence: Secondary | ICD-10-CM | POA: Diagnosis not present

## 2017-10-04 LAB — CBC
HEMATOCRIT: 34.8 % — AB (ref 36.0–46.0)
HEMOGLOBIN: 11.6 g/dL — AB (ref 12.0–15.0)
MCH: 27.1 pg (ref 26.0–34.0)
MCHC: 33.3 g/dL (ref 30.0–36.0)
MCV: 81.3 fL (ref 78.0–100.0)
Platelets: 250 10*3/uL (ref 150–400)
RBC: 4.28 MIL/uL (ref 3.87–5.11)
RDW: 14.1 % (ref 11.5–15.5)
WBC: 6.6 10*3/uL (ref 4.0–10.5)

## 2017-10-04 LAB — COMPREHENSIVE METABOLIC PANEL
ALT: 17 U/L (ref 14–54)
AST: 20 U/L (ref 15–41)
Albumin: 4.2 g/dL (ref 3.5–5.0)
Alkaline Phosphatase: 84 U/L (ref 38–126)
Anion gap: 10 (ref 5–15)
BUN: 20 mg/dL (ref 6–20)
CO2: 21 mmol/L — ABNORMAL LOW (ref 22–32)
Calcium: 9.7 mg/dL (ref 8.9–10.3)
Chloride: 110 mmol/L (ref 101–111)
Creatinine, Ser: 0.89 mg/dL (ref 0.44–1.00)
Glucose, Bld: 225 mg/dL — ABNORMAL HIGH (ref 65–99)
POTASSIUM: 4.3 mmol/L (ref 3.5–5.1)
Sodium: 141 mmol/L (ref 135–145)
Total Bilirubin: 0.5 mg/dL (ref 0.3–1.2)
Total Protein: 8 g/dL (ref 6.5–8.1)

## 2017-10-04 LAB — I-STAT BETA HCG BLOOD, ED (MC, WL, AP ONLY): I-stat hCG, quantitative: <5 m[IU]/mL (ref <5)

## 2017-10-04 LAB — LIPASE, BLOOD: Lipase: 24 U/L (ref 11–51)

## 2017-10-04 MED ORDER — OXYMETAZOLINE HCL 0.05 % NA SOLN
2.0000 | Freq: Once | NASAL | Status: AC
  Administered 2017-10-04: 2 via NASAL
  Filled 2017-10-04: qty 15

## 2017-10-04 MED ORDER — BENZONATATE 100 MG PO CAPS
200.0000 mg | ORAL_CAPSULE | Freq: Once | ORAL | Status: AC
  Administered 2017-10-04: 200 mg via ORAL
  Filled 2017-10-04: qty 2

## 2017-10-04 MED ORDER — DEXAMETHASONE SODIUM PHOSPHATE 10 MG/ML IJ SOLN: 10.0000 mg | Freq: Once | INTRAMUSCULAR | Status: DC

## 2017-10-04 MED ORDER — KETOROLAC TROMETHAMINE 60 MG/2ML IM SOLN
60.0000 mg | Freq: Once | INTRAMUSCULAR | Status: AC
  Administered 2017-10-04: 60 mg via INTRAMUSCULAR
  Filled 2017-10-04: qty 2

## 2017-10-04 MED ORDER — ALBUTEROL SULFATE (2.5 MG/3ML) 0.083% IN NEBU
5.0000 mg | INHALATION_SOLUTION | Freq: Once | RESPIRATORY_TRACT | Status: AC
  Administered 2017-10-04: 5 mg via RESPIRATORY_TRACT
  Filled 2017-10-04: qty 6

## 2017-10-04 MED ORDER — IPRATROPIUM BROMIDE 0.02 % IN SOLN
0.5000 mg | Freq: Once | RESPIRATORY_TRACT | Status: AC
  Administered 2017-10-04: 0.5 mg via RESPIRATORY_TRACT
  Filled 2017-10-04: qty 2.5

## 2017-10-04 MED ORDER — ONDANSETRON 4 MG PO TBDP: 4.0000 mg | ORAL_TABLET | Freq: Once | ORAL | Status: DC | PRN

## 2017-10-04 MED ORDER — DEXAMETHASONE 4 MG PO TABS
10.0000 mg | ORAL_TABLET | Freq: Once | ORAL | Status: AC
  Administered 2017-10-04: 10 mg via ORAL
  Filled 2017-10-04: qty 2

## 2017-10-04 NOTE — ED Provider Notes (Signed)
[facility] DEPT Provider Note   CSN: [id number] Arrival date & time: 10/04/17  2103     History   Chief Complaint Chief Complaint  Patient presents with  . Cough  . Emesis    HPI Deborah [name] is a 47 y.o. female.  47 year old female with a history of diabetes, esophageal reflux, hypertension, dyslipidemia, polysubstance abuse presents to the emergency department for complaints of upper respiratory symptoms and cough.  She notes nasal congestion with associated postnasal drip and dry, nonproductive cough x2 weeks.  She reports increased breathing difficulty, mostly when breathing through her nose.  Her frequent coughing has also led to posttussive emesis on multiple occasions.  She denies any hematemesis or hemoptysis.  No associated fever.  The patient works at [employer] and states that she has been around other individuals sick with similar symptoms.  The history is provided by the patient. No language interpreter was used.  Cough   Emesis   Associated symptoms include cough.    Past Medical History:  Diagnosis Date  . Allergy   . Anemia   . Arthritis   . Diabetes mellitus   . Eczema   . GERD (gastroesophageal reflux disease)   . Gout   . Hyperlipidemia   . Hypertension   . Neuromuscular disorder ([place])   . Polysubstance abuse ([place])    ETOH and Cocaine  . Seasonal allergies     Patient Active Problem List   Diagnosis Date Noted  . Uncontrolled type 2 diabetes mellitus without complication, without long-term current use of insulin ([place]) 10/05/2015  . 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 LATERAL EPICONDYLITIS, LEFT 11/03/2009  . MUSCULOSKELETAL PAIN 10/01/2009  . DYSLIPIDEMIA 12/05/2008  . FATTY LIVER DISEASE 10/20/2008  . NUMMULAR ECZEMA 05/22/2008  . FEMALE INFERTILITY 05/01/2007  . ANEMIA, IRON DEFICIENCY 04/03/2007  . SMOKER 11/28/2006  . HYPERTENSION, MILD 11/28/2006  . HEMORRHOIDS, INTERNAL W/O COMPLICATION 12/87/8676  .  RHINITIS, ALLERGIC, DUE TO POLLEN 11/28/2006  . GERD 11/28/2006  . DERMATITIS, ATOPIC 11/28/2006  . COCAINE ABUSE, HX OF 11/28/2006  . LIVER FUNCTION TESTS, ABNORMAL 09/14/2006  . GOUT 06/25/2004  . MORTON'S NEUROMA, 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    Past Surgical History:  Procedure Laterality Date  . HEMORRHOID SURGERY       OB History    Gravida  0   Para      Term      Preterm      AB      Living  0     SAB      TAB      Ectopic      Multiple      Live Births               Home Medications    Prior to Admission medications   Medication Sig Start Date End Date Taking? Authorizing Provider  allopurinol (ZYLOPRIM) 100 MG tablet Take 1 tablet (100 mg total) by mouth daily. 05/03/17  Yes Copland, Gay Filler, MD  amLODipine (NORVASC) 10 MG tablet TAKE 1 TABLET (10 MG TOTAL) BY MOUTH DAILY. 04/26/17  Yes Copland, Gay Filler, MD  Chlorpheniramine-Acetaminophen (CORICIDIN HBP COLD/FLU PO) Take 1 capsule by mouth daily as needed (cold).   Yes [provider]  diphenhydrAMINE (BENADRYL) 25 mg capsule Take 25 mg by mouth every 4 (four) hours as needed for allergies.    Yes [provider]  fluticasone (FLONASE) 50 MCG/ACT nasal spray Place 2 sprays into both nostrils daily. Patient  taking differently: Place 2 sprays into both nostrils daily as needed for allergies.  06/29/16  Yes Copland, Gay Filler, MD  lisinopril (PRINIVIL,ZESTRIL) 10 MG tablet Take 1 tablet (10 mg total) by mouth daily. 05/03/17  Yes Copland, Gay Filler, MD  loratadine (CLARITIN) 10 MG tablet TAKE 1 TABLET (10 MG TOTAL) BY MOUTH DAILY. 07/21/17  Yes Copland, Gay Filler, MD  menthol-cetylpyridinium (CEPACOL) 3 MG lozenge Take 1 lozenge by mouth as needed for sore throat.   Yes [provider]  metFORMIN (GLUCOPHAGE) 1000 MG tablet Take 1 tablet (1,000 mg total) by mouth 2 (two) times daily. 05/03/17  Yes Copland, Gay Filler, MD  triamcinolone cream (KENALOG) 0.1 % Apply 1 application topically  2 (two) times daily. Use as needed for eczema 04/20/16  Yes Copland, Gay Filler, MD  amitriptyline (ELAVIL) 10 MG tablet Take 1 tablet (10 mg total) by mouth at bedtime. Patient not taking: Reported on 10/04/2017 05/03/17   Copland, Gay Filler, MD  benzonatate (TESSALON) 100 MG capsule Take 1 capsule (100 mg total) by mouth 3 (three) times daily as needed for cough. 10/05/17   Antonietta Breach, PA-C  blood glucose meter kit and supplies Dispense based on patient and insurance preference. Use 1-2x daily as desired to monitor glucose (FOR ICD-9 250.00, 250.01). 12/08/16   Copland, Gay Filler, MD  erythromycin ophthalmic ointment Place a 1/2 inch ribbon of ointment into the lower eyelid x 3 days Patient not taking: Reported on 10/04/2017 10/18/16   Bettey Costa, PA  esomeprazole (NEXIUM) 40 MG capsule Take 1 capsule (40 mg total) by mouth daily as needed (acid reflux). Patient not taking: Reported on 10/04/2017 07/12/17   Copland, Gay Filler, MD  glucose blood (ACCU-CHEK GUIDE) test strip Use as instructed 12/08/16   Copland, Gay Filler, MD  indomethacin (INDOCIN) 50 MG capsule Take 1 capsule (50 mg total) by mouth 2 (two) times daily. Use as needed for gout flare Patient not taking: Reported on 10/04/2017 05/03/17   Copland, Gay Filler, MD  ketorolac (ACULAR) 0.5 % ophthalmic solution Place 1 drop into the right eye every 6 (six) hours. Patient not taking: Reported on 10/04/2017 10/18/16   Bettey Costa, PA  Lancets (ACCU-CHEK SOFT [facility]) lancets Use as instructed 12/08/16   Copland, Gay Filler, MD  mupirocin cream (BACTROBAN) 2 % Apply 1 application topically 2 (two) times daily. Patient not taking: Reported on 10/04/2017 04/12/16   Brunetta Jeans, PA-C  ondansetron (ZOFRAN ODT) 4 MG disintegrating tablet Take 1 tablet (4 mg total) by mouth every 8 (eight) hours as needed for nausea or vomiting. 10/05/17   Antonietta Breach, PA-C    Family History Family History  Problem Relation Age of Onset  . Hypertension  Father   . Diabetes Father   . Cancer Father   . Hypertension Other   . Diabetes Other   . Diabetes Mother   . Hypertension Mother   . Diabetes Maternal Grandmother   . Hypertension Maternal Grandmother   . Diabetes Paternal Grandfather     Social History Social History   Tobacco Use  . Smoking status: Former Smoker    Packs/day: 0.50  . Smokeless tobacco: Former Systems developer    Quit date: 03/14/2013  Substance Use Topics  . Alcohol use: No    Comment: none since 03-14-14  . Drug use: Yes    Types: Marijuana, Cocaine     Allergies   Cephalexin; Hydrocodone; Neomycin-bacitracin zn-polymyx; Sertraline; and Adhesive [tape]   Review of Systems  Review of Systems  Respiratory: Positive for cough.   Gastrointestinal: Positive for vomiting.  Ten systems reviewed and are negative for acute change, except as noted in the HPI.    Physical Exam Updated Vital Signs BP 136/77 (BP Location: Right Arm)   Pulse 96   Temp 97.9 F (36.6 C) (Oral)   Resp 17   Ht _0  (1.499 m)   Wt 77.1 kg (170 lb)   LMP 09/11/2017   SpO2 100%   BMI 34.34 kg/m   Physical Exam  Constitutional: She is oriented to person, place, and time. She appears well-developed and well-nourished. No distress.  Nontoxic appearing and in NAD  HENT:  Head: Normocephalic and atraumatic.  Nasal congestion. Clear oropharynx. Tolerating secretions.  Eyes: Conjunctivae and EOM are normal. No scleral icterus.  Neck: Normal range of motion.  Cardiovascular: Normal rate, regular rhythm and intact distal pulses.  Pulmonary/Chest: Effort normal. No stridor. No respiratory distress. She has no wheezes. She has no rales.  Respirations even and unlabored. Spastic, dry, nonproductive cough. Lungs CTAB.  Musculoskeletal: Normal range of motion.  Neurological: She is alert and oriented to person, place, and time. She exhibits normal muscle tone. Coordination normal.  Skin: Skin is warm and dry. No rash noted. She is not  diaphoretic. No erythema. No pallor.  Psychiatric: She has a normal mood and affect. Her behavior is normal.  Nursing note and vitals reviewed.    ED Treatments / Results  Labs (all labs ordered are listed, but only abnormal results are displayed) Labs Reviewed  COMPREHENSIVE METABOLIC PANEL - Abnormal; Notable for the following components:      Result Value   CO2 21 (*)    Glucose, Bld 225 (*)    All other components within normal limits  CBC - Abnormal; Notable for the following components:   Hemoglobin 11.6 (*)    HCT 34.8 (*)    All other components within normal limits  LIPASE, BLOOD  URINALYSIS, ROUTINE W REFLEX MICROSCOPIC  I-STAT BETA HCG BLOOD, ED ([facility], [facility], [facility] ONLY)    EKG None  Radiology Dg Chest 2 View  Result Date: 10/04/2017 CLINICAL DATA:  Worsening cough for 2 weeks. Difficulty breathing. Vomiting today. History of diabetes, hypertension, previous smoker. EXAM: CHEST - 2 VIEW COMPARISON:  None. FINDINGS: The heart size and mediastinal contours are within normal limits. Both lungs are clear. The visualized skeletal structures are unremarkable. IMPRESSION: No active cardiopulmonary disease. Electronically Signed   By: Lucienne Capers M.D.   On: 10/04/2017 22:39    Procedures Procedures (including critical care time)  Medications Ordered in ED Medications  ondansetron (ZOFRAN-ODT) disintegrating tablet 4 mg (has no administration in time range)  benzonatate (TESSALON) capsule 200 mg (200 mg Oral Given 10/04/17 2314)  albuterol (PROVENTIL) (2.5 MG/3ML) 0.083% nebulizer solution 5 mg (5 mg Nebulization Given 10/04/17 2314)  oxymetazoline (AFRIN) 0.05 % nasal spray 2 spray (2 sprays Each Nare Given 10/04/17 2314)  ipratropium (ATROVENT) nebulizer solution 0.5 mg (0.5 mg Nebulization Given 10/04/17 2314)  dexamethasone (DECADRON) tablet 10 mg (10 mg Oral Given 10/04/17 2315)  ketorolac (TORADOL) injection 60 mg (60 mg Intramuscular Given 10/04/17 2315)  albuterol (PROVENTIL  HFA;VENTOLIN HFA) 108 (90 Base) MCG/ACT inhaler 2 puff (2 puffs Inhalation Given 10/05/17 0038)    11:11 PM Patient presenting for upper respiratory symptoms and cough.  She is on lisinopril; however, in the setting of mucosal edema, postnasal drip symptoms more consistent with sinusitis.  Will manage supportively in  the emergency department and reassess.  X-ray negative for pneumonia.  PERC negative.  12:16 AM Patient reports improvement in symptoms following medications.  She is complaining of slight tremors and palpitations from the use of albuterol.  Otherwise, no complaints.  Expresses comfort with discharge.   Initial Impression / Assessment and Plan / ED Course  I have reviewed the triage vital signs and the nursing notes.  Pertinent labs & imaging results that were available during my care of the patient were reviewed by me and considered in my medical decision making (see chart for details).     Patient complaining of symptoms of sinusitis.  Mild to moderate symptoms of clear/yellow nasal discharge/congestion and scratchy throat with cough for less than 10 days.  Patient is afebrile.  No concern for acute bacterial rhinosinusitis; likely viral in nature versus allergic.  Patient discharged with symptomatic treatment.  Recommendations for follow-up with primary care physician.  Return precautions discussed and provided. Patient discharged in stable condition with no unaddressed concerns.  Vitals:   10/04/17 2119 10/04/17 2135 10/04/17 2346  BP: (!) 156/83  136/77  Pulse: 99  96  Resp: 18  17  Temp: 98.2 F (36.8 C)  97.9 F (36.6 C)  TempSrc: Oral  Oral  SpO2: 100%  100%  Weight:  77.1 kg (170 lb)   Height:  _0  (1.499 m)     Final Clinical Impressions(s) / ED Diagnoses   Final diagnoses:  Acute sinusitis, recurrence not specified, unspecified location  Cough    ED Discharge Orders        Ordered    benzonatate (TESSALON) 100 MG capsule  3 times daily PRN      10/05/17 0016    ondansetron (ZOFRAN ODT) 4 MG disintegrating tablet  Every 8 hours PRN     10/05/17 0016       Antonietta Breach, PA-C 10/05/17 0055    Merrily Pew, MD 10/08/17 1530

## 2017-10-04 NOTE — ED Triage Notes (Signed)
Pt reports having worsening cough for the las 2 weeks and feels like its hard to breathe and then today began having vomiting.

## 2017-10-05 LAB — URINALYSIS, ROUTINE W REFLEX MICROSCOPIC
Bilirubin Urine: NEGATIVE
GLUCOSE, UA: NEGATIVE mg/dL
Hgb urine dipstick: NEGATIVE
Ketones, ur: NEGATIVE mg/dL
LEUKOCYTES UA: NEGATIVE
Nitrite: NEGATIVE
PH: 5 (ref 5.0–8.0)
Protein, ur: NEGATIVE mg/dL
Specific Gravity, Urine: 1.025 (ref 1.005–1.030)

## 2017-10-05 MED ORDER — ALBUTEROL SULFATE HFA 108 (90 BASE) MCG/ACT IN AERS
2.0000 | INHALATION_SPRAY | Freq: Once | RESPIRATORY_TRACT | Status: AC
  Administered 2017-10-05: 2 via RESPIRATORY_TRACT
  Filled 2017-10-05: qty 6.7

## 2017-10-05 MED ORDER — ONDANSETRON 4 MG PO TBDP: 4.0000 mg | ORAL_TABLET | Freq: Three times a day (TID) | ORAL | 0 refills | Status: DC | PRN

## 2017-10-05 MED ORDER — BENZONATATE 100 MG PO CAPS: 100.0000 mg | ORAL_CAPSULE | Freq: Three times a day (TID) | ORAL | 0 refills | Status: DC | PRN

## 2017-10-05 NOTE — Discharge Instructions (Signed)
We recommend the use of Zyrtec-D or Claritin-D.  This can be purchased behind the Artist.  Take as prescribed.  You may use 2 puffs of an albuterol inhaler every 4-6 hours as needed for cough, wheezing, shortness of breath.  Continue with Afrin, 2 sprays in each nostril twice a day.  Do not use longer than 3 consecutive days.  May take Tessalon as prescribed for persistent cough and Zofran for nausea.  Follow-up with your primary care doctor to ensure resolution of symptoms.

## 2017-10-06 MED FILL — ALLOPURINOL 100 MG TABLET: 100 | 30 days supply | Qty: 30 | Fill #3

## 2017-11-02 MED FILL — AMLODIPINE BESYLATE 10 MG T: 10 | 90 days supply | Qty: 90 | Fill #2

## 2017-11-14 IMAGING — CT CT ABD-PELV W/ CM
2 of 6 series · 16 of 46 positions shown, 18 images · IV contrast (OMNIPAQUE 300)
Comparison: None.

CLINICAL DATA: Cramping abdominal pain beginning 4 days ago with
nausea, vomiting, headache, fevers and chills, diarrhea. History of
diabetes, polysubstance abuse, hypertension.

EXAM:
CT ABDOMEN AND PELVIS WITH CONTRAST
TECHNIQUE: Multidetector CT imaging of the abdomen and pelvis was performed
using the standard protocol following bolus administration of
intravenous contrast.
CONTRAST:  25mL OMNIPAQUE IOHEXOL 300 MG/ML SOLN, 100mL OMNIPAQUE
IOHEXOL 300 MG/ML SOLN

[Series 2: abd/pel with · axial · 0.66mm/px · z∈[+1017,+1372]mm · 13 of 81 slices shown, 15 images]
[im 5/81  soft-tissue]
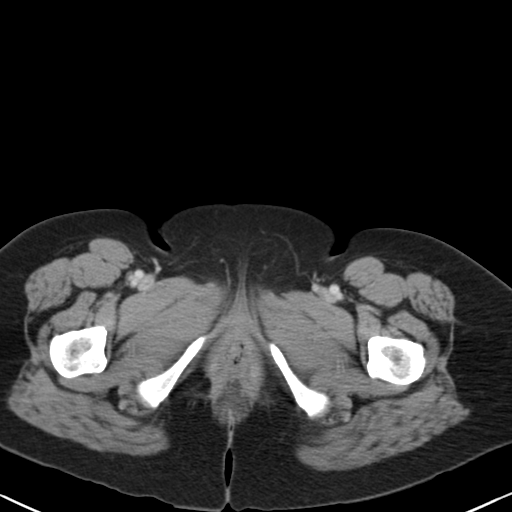
[im 5/81  bone]
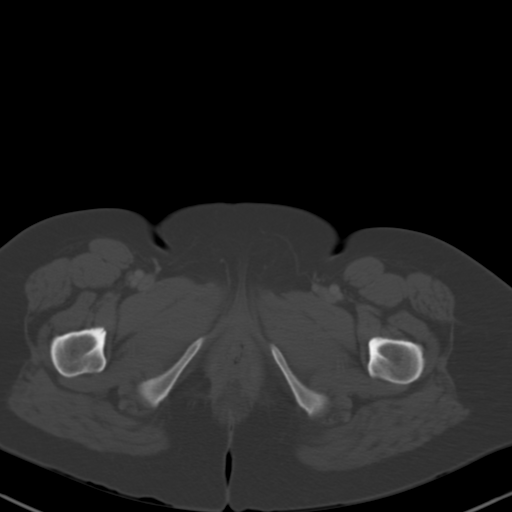
[im 10/81  soft-tissue]
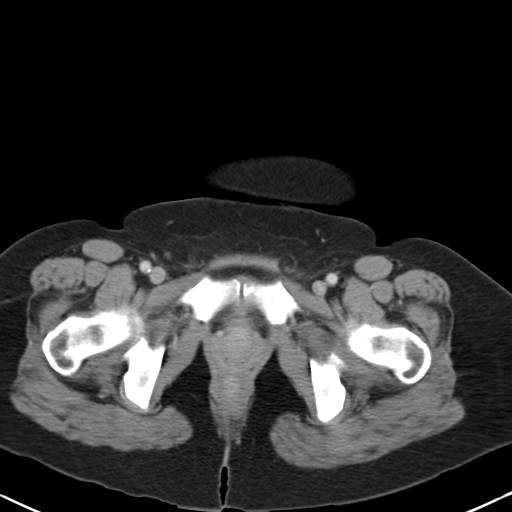
[im 19/81  soft-tissue]
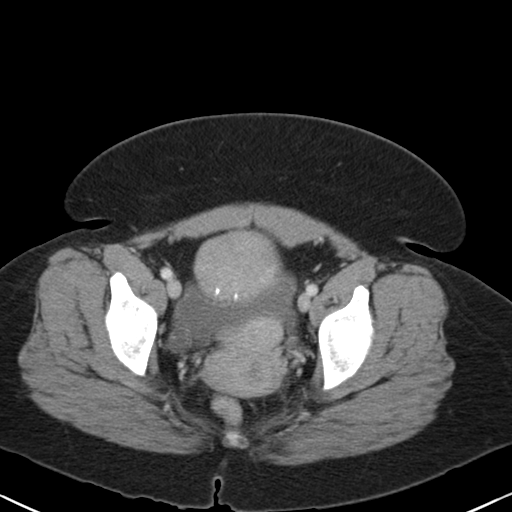
[im 24/81  soft-tissue]
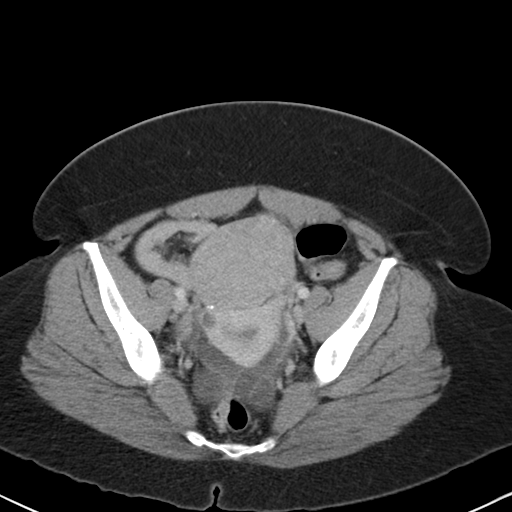
[im 29/81  soft-tissue]
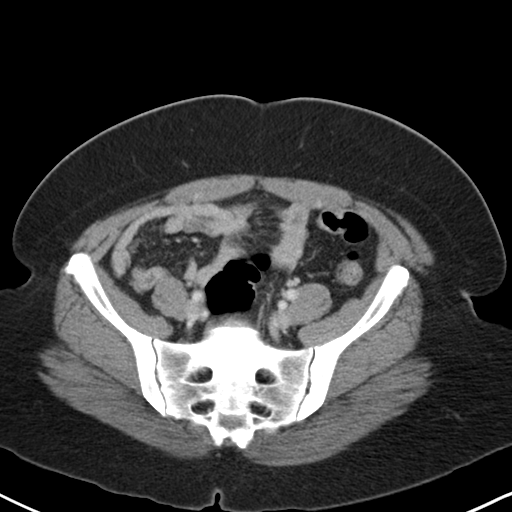
[im 33/81  soft-tissue]
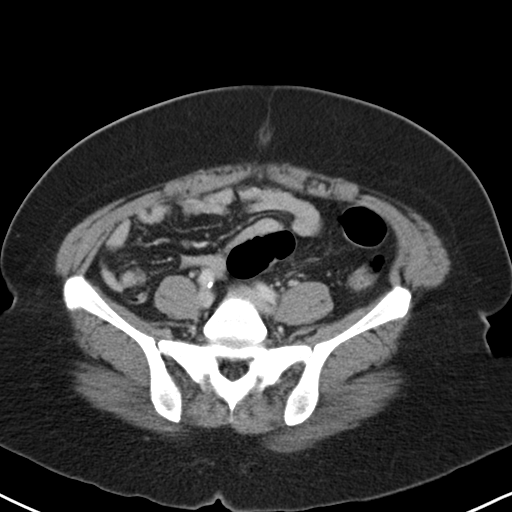
[im 43/81  soft-tissue]
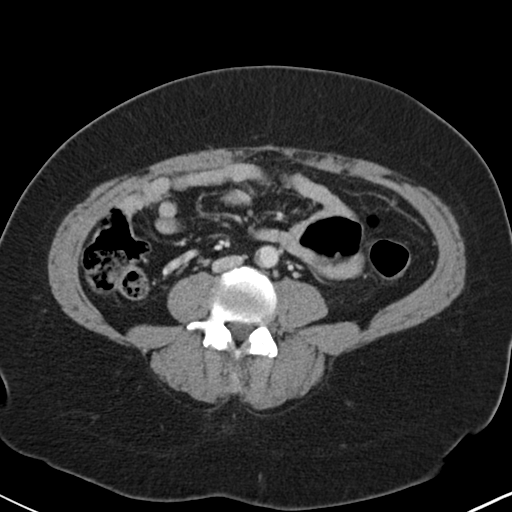
[im 48/81  soft-tissue]
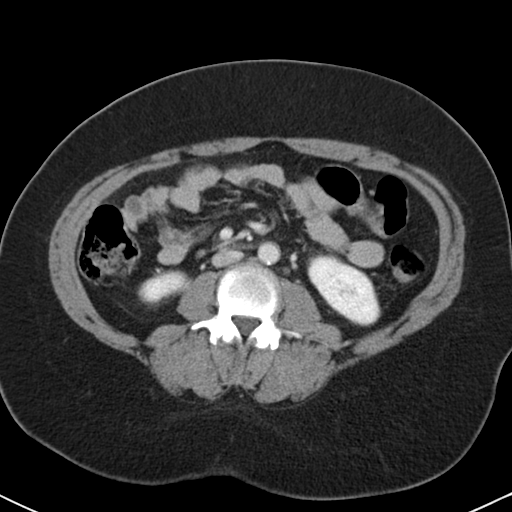
[im 52/81  soft-tissue]
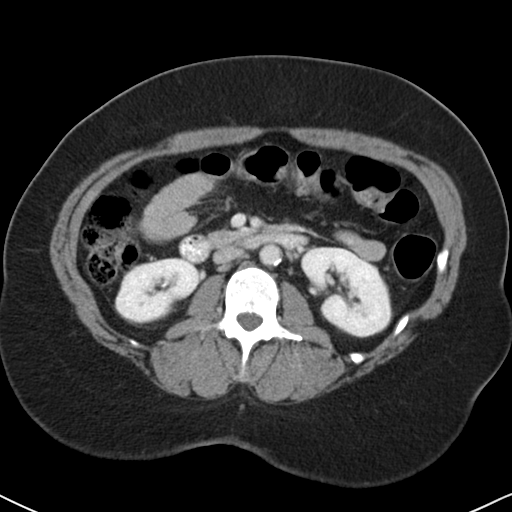
[im 52/81  bone]
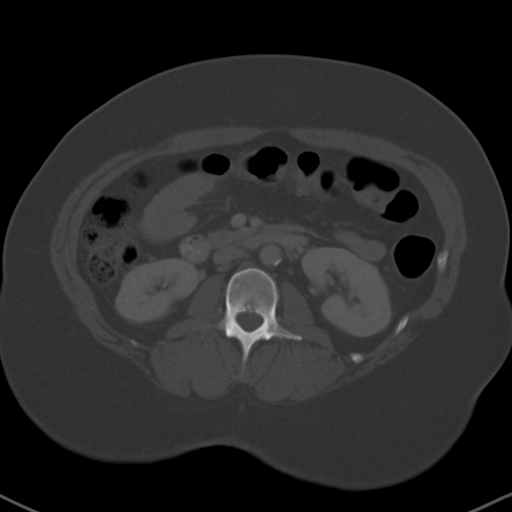
[im 57/81  soft-tissue]
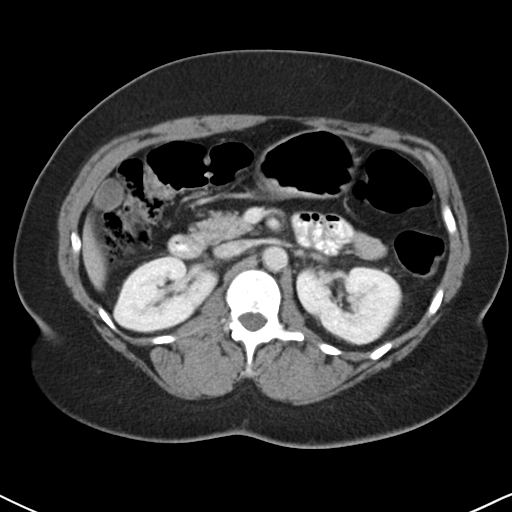
[im 62/81  soft-tissue]
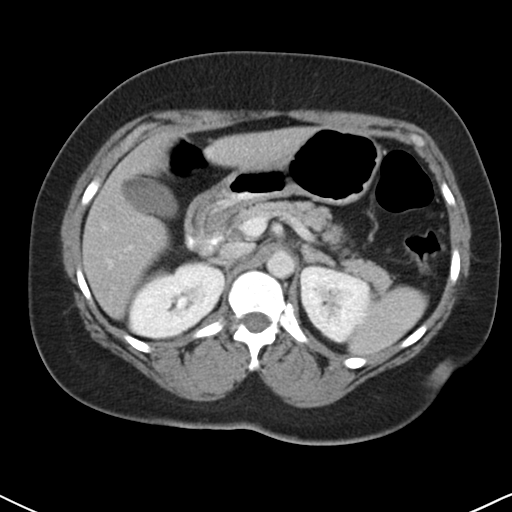
[im 71/81  soft-tissue]
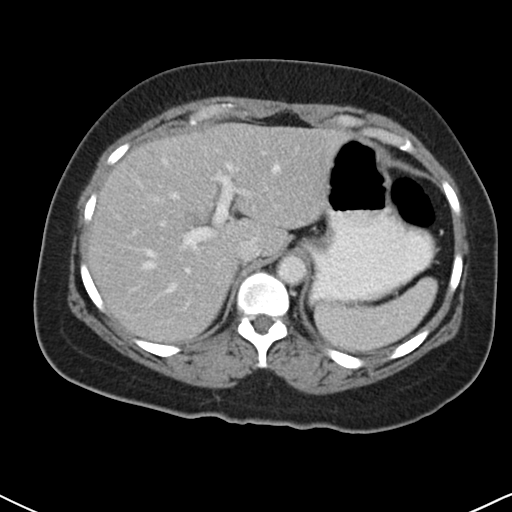
[im 76/81  soft-tissue]
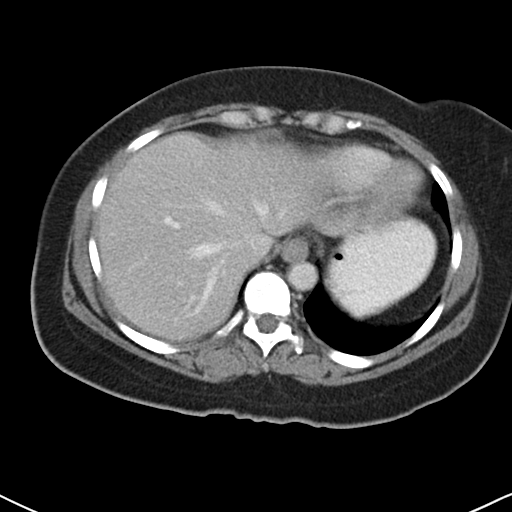

[Series 4: coronal a/|p · coronal · 0.63mm/px · 3 of 91 slices shown]
[im 31/91  soft-tissue]
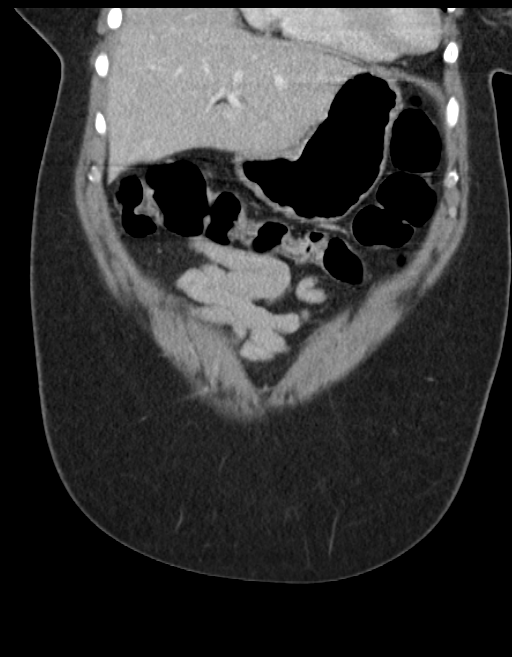
[im 41/91  soft-tissue]
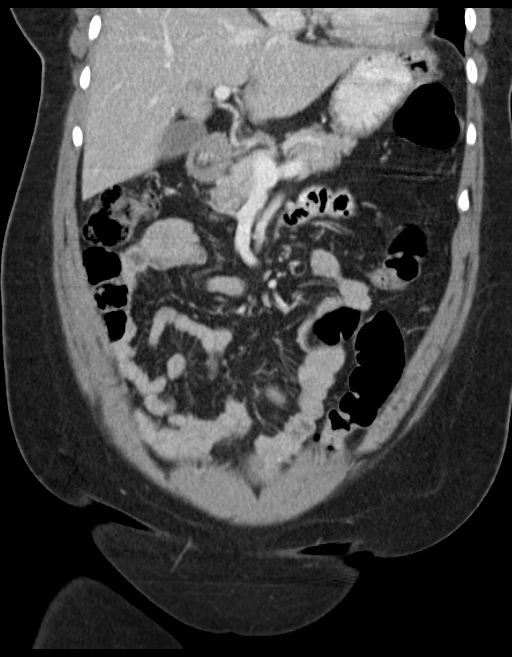
[im 51/91  soft-tissue]
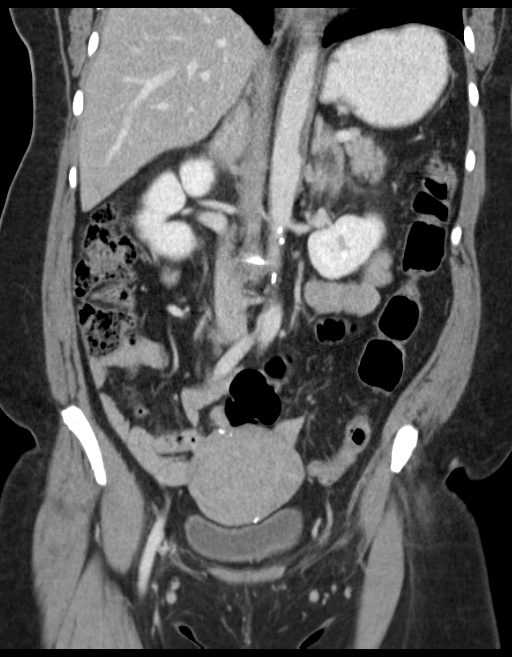

[16 of 46 positions shown; findings below may reference images not displayed]

FINDINGS: LUNG BASES: Included view of the lung bases are clear. Visualized
heart and pericardium are unremarkable.

SOLID ORGANS: The liver, spleen, gallbladder, pancreas are
unremarkable. Mildly thickened adrenal glands can be seen with
hyperplasia without discrete nodule.

GASTROINTESTINAL TRACT: The stomach, small and large bowel are
normal in course and caliber without inflammatory changes. Linear
density at the level of the anorectal junction may represent
surgical material. Normal appendix.

KIDNEYS/ URINARY TRACT: Kidneys are orthotopic, demonstrating
symmetric enhancement. No nephrolithiasis, hydronephrosis or solid
renal masses. The unopacified ureters are normal in course and
caliber. Delayed imaging through the kidneys demonstrates symmetric
prompt contrast excretion within the proximal urinary collecting
system. Urinary bladder is partially distended and unremarkable.

PERITONEUM/RETROPERITONEUM: Aortoiliac vessels are normal in course
and caliber, mild calcific atherosclerosis. No lymphadenopathy by CT
size criteria. No intraperitoneal free fluid nor free air. Tubular
cystic structures in the pelvis. Lobulated uterine contour most
consistent with leiomyomas. 7.5 x 6 cm exophytic, as seen sub
serosal leiomyoma from the uterine fundus.

SOFT TISSUE/OSSEOUS STRUCTURES: Non-suspicious.
IMPRESSION: Tubular cystic structures in the pelvis likely represent
hydrosalpinx, less likely pyosalpinx. Multiple uterine leiomyomata,
including 7.5 x 6 cm sub serosal leiomyoma. Findings would be better
characterized on pelvic sonogram.

## 2017-11-14 MED FILL — metFORMIN HCL 1000 MG TABS: 1000 | 30 days supply | Qty: 60 | Fill #3

## 2017-11-14 MED FILL — ALLOPURINOL 100 MG TABLET: 100 | 30 days supply | Qty: 30 | Fill #4

## 2017-12-12 MED FILL — ALLOPURINOL 100 MG TABLET: 100 | 30 days supply | Qty: 30 | Fill #5

## 2017-12-12 MED FILL — LISINOPRIL 10 MG TABLET: 10 | 90 days supply | Qty: 90 | Fill #2

## 2018-01-01 MED FILL — metFORMIN HCL 1000 MG TABS: 1000 | 30 days supply | Qty: 60 | Fill #4

## 2018-01-16 ENCOUNTER — Emergency Department (HOSPITAL_BASED_OUTPATIENT_CLINIC_OR_DEPARTMENT_OTHER)
Admission: EM | Admit: 2018-01-16 | Discharge: 2018-01-17 | Disposition: A | Discharge status: Home / Self Care | Payer: Managed Care, Other (non HMO) | Attending: Emergency Medicine | Admitting: Emergency Medicine

## 2018-01-16 ENCOUNTER — Other Ambulatory Visit: Payer: Self-pay

## 2018-01-16 ENCOUNTER — Encounter (HOSPITAL_BASED_OUTPATIENT_CLINIC_OR_DEPARTMENT_OTHER): Payer: Self-pay | Admitting: *Deleted

## 2018-01-16 DIAGNOSIS — J069 Acute upper respiratory infection, unspecified: Secondary | ICD-10-CM | POA: Insufficient documentation

## 2018-01-16 DIAGNOSIS — Z7984 Long term (current) use of oral hypoglycemic drugs: Secondary | ICD-10-CM | POA: Diagnosis not present

## 2018-01-16 DIAGNOSIS — R059 Cough, unspecified: Secondary | ICD-10-CM

## 2018-01-16 DIAGNOSIS — B9789 Other viral agents as the cause of diseases classified elsewhere: Secondary | ICD-10-CM | POA: Diagnosis not present

## 2018-01-16 DIAGNOSIS — Z87891 Personal history of nicotine dependence: Secondary | ICD-10-CM | POA: Diagnosis not present

## 2018-01-16 DIAGNOSIS — E119 Type 2 diabetes mellitus without complications: Secondary | ICD-10-CM | POA: Diagnosis not present

## 2018-01-16 DIAGNOSIS — J0100 Acute maxillary sinusitis, unspecified: Secondary | ICD-10-CM | POA: Diagnosis not present

## 2018-01-16 DIAGNOSIS — Z79899 Other long term (current) drug therapy: Secondary | ICD-10-CM | POA: Insufficient documentation

## 2018-01-16 DIAGNOSIS — R05 Cough: Secondary | ICD-10-CM | POA: Insufficient documentation

## 2018-01-16 DIAGNOSIS — I10 Essential (primary) hypertension: Secondary | ICD-10-CM | POA: Insufficient documentation

## 2018-01-16 NOTE — ED Triage Notes (Signed)
Cough x 5 days. Chest tightness when she coughs. Headache. No relief with Proventil inhaler. She has been using OTC allergy medications.

## 2018-01-17 MED ORDER — BENZONATATE 100 MG PO CAPS: 100.0000 mg | ORAL_CAPSULE | Freq: Three times a day (TID) | ORAL | 0 refills | Status: DC

## 2018-01-17 MED ORDER — PSEUDOEPHEDRINE HCL 30 MG PO TABS
30.0000 mg | ORAL_TABLET | Freq: Once | ORAL | Status: AC
  Administered 2018-01-17: 30 mg via ORAL
  Filled 2018-01-17: qty 1

## 2018-01-17 NOTE — ED Provider Notes (Signed)
[facility] EMERGENCY DEPARTMENT Provider Note   CSN: [id number] Arrival date & time: 01/16/18  2218     History   Chief Complaint Chief Complaint  Patient presents with  . Cough    HPI Deborah [name] is a 47 y.o. female.  The history is provided by the patient.  Cough  This is a new problem. The current episode started more than 2 days ago. The problem has been gradually worsening. The cough is productive of sputum. There has been no fever. Associated symptoms include sore throat, myalgias and shortness of breath.  Patient presents for 5 days of nasal congestion, cough mild chest tightness.  She reports mild shortness of breath.  She reports sore throat from coughing. , No vomiting.  She also reports mild headache.  Denies hemoptysis.  She reports postnasal drip.  Past Medical History:  Diagnosis Date  . Allergy   . Anemia   . Arthritis   . Diabetes mellitus   . Eczema   . GERD (gastroesophageal reflux disease)   . Gout   . Hyperlipidemia   . Hypertension   . Neuromuscular disorder ([place])   . Polysubstance abuse ([place])    ETOH and Cocaine  . Seasonal allergies     Patient Active Problem List   Diagnosis Date Noted  . Uncontrolled type 2 diabetes mellitus without complication, without long-term current use of insulin ([place]) 10/05/2015  . 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 LATERAL EPICONDYLITIS, LEFT 11/03/2009  . MUSCULOSKELETAL PAIN 10/01/2009  . DYSLIPIDEMIA 12/05/2008  . FATTY LIVER DISEASE 10/20/2008  . NUMMULAR ECZEMA 05/22/2008  . FEMALE INFERTILITY 05/01/2007  . ANEMIA, IRON DEFICIENCY 04/03/2007  . SMOKER 11/28/2006  . HYPERTENSION, MILD 11/28/2006  . HEMORRHOIDS, INTERNAL W/O COMPLICATION 28/63/8177  . RHINITIS, ALLERGIC, DUE TO POLLEN 11/28/2006  . GERD 11/28/2006  . DERMATITIS, ATOPIC 11/28/2006  . COCAINE ABUSE, HX OF 11/28/2006  . LIVER FUNCTION TESTS, ABNORMAL 09/14/2006  . GOUT 06/25/2004  . MORTON'S NEUROMA, 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     Past Surgical History:  Procedure Laterality Date  . HEMORRHOID SURGERY       OB History    Gravida  0   Para      Term      Preterm      AB      Living  0     SAB      TAB      Ectopic      Multiple      Live Births               Home Medications    Prior to Admission medications   Medication Sig Start Date End Date Taking? Authorizing Provider  allopurinol (ZYLOPRIM) 100 MG tablet Take 1 tablet (100 mg total) by mouth daily. 05/03/17  Yes Copland, Gay Filler, MD  amitriptyline (ELAVIL) 10 MG tablet Take 1 tablet (10 mg total) by mouth at bedtime. 05/03/17  Yes Copland, Gay Filler, MD  amLODipine (NORVASC) 10 MG tablet TAKE 1 TABLET (10 MG TOTAL) BY MOUTH DAILY. 04/26/17  Yes Copland, Gay Filler, MD  blood glucose meter kit and supplies Dispense based on patient and insurance preference. Use 1-2x daily as desired to monitor glucose (FOR ICD-9 250.00, 250.01). 12/08/16  Yes Copland, Gay Filler, MD  diphenhydrAMINE (BENADRYL) 25 mg capsule Take 25 mg by mouth every 4 (four) hours as needed for allergies.    Yes [provider]  esomeprazole (NEXIUM) 40 MG capsule Take 1 capsule (40 mg  total) by mouth daily as needed (acid reflux). 07/12/17  Yes Copland, Gay Filler, MD  glucose blood (ACCU-CHEK GUIDE) test strip Use as instructed 12/08/16  Yes Copland, Gay Filler, MD  indomethacin (INDOCIN) 50 MG capsule Take 1 capsule (50 mg total) by mouth 2 (two) times daily. Use as needed for gout flare 05/03/17  Yes Copland, Gay Filler, MD  Lancets (ACCU-CHEK SOFT TOUCH) lancets Use as instructed 12/08/16  Yes Copland, Gay Filler, MD  lisinopril (PRINIVIL,ZESTRIL) 10 MG tablet Take 1 tablet (10 mg total) by mouth daily. 05/03/17  Yes Copland, Gay Filler, MD  loratadine (CLARITIN) 10 MG tablet TAKE 1 TABLET (10 MG TOTAL) BY MOUTH DAILY. 07/21/17  Yes Copland, Gay Filler, MD  metFORMIN (GLUCOPHAGE) 1000 MG tablet Take 1 tablet (1,000 mg total) by mouth 2 (two) times daily. 05/03/17  Yes  Copland, Gay Filler, MD  triamcinolone cream (KENALOG) 0.1 % Apply 1 application topically 2 (two) times daily. Use as needed for eczema 04/20/16  Yes Copland, Gay Filler, MD  benzonatate (TESSALON) 100 MG capsule Take 1 capsule (100 mg total) by mouth every 8 (eight) hours. 01/17/18   Ripley Fraise, MD  Chlorpheniramine-Acetaminophen (CORICIDIN HBP COLD/FLU PO) Take 1 capsule by mouth daily as needed (cold).    [provider]  fluticasone (FLONASE) 50 MCG/ACT nasal spray Place 2 sprays into both nostrils daily. Patient taking differently: Place 2 sprays into both nostrils daily as needed for allergies.  06/29/16   Copland, Gay Filler, MD  ketorolac (ACULAR) 0.5 % ophthalmic solution Place 1 drop into the right eye every 6 (six) hours. Patient not taking: Reported on 10/04/2017 10/18/16   Bettey Costa, [place]  menthol-cetylpyridinium (CEPACOL) 3 MG lozenge Take 1 lozenge by mouth as needed for sore throat.    [provider]  ondansetron (ZOFRAN ODT) 4 MG disintegrating tablet Take 1 tablet (4 mg total) by mouth every 8 (eight) hours as needed for nausea or vomiting. 10/05/17   Antonietta Breach, PA-C    Family History Family History  Problem Relation Age of Onset  . Hypertension Father   . Diabetes Father   . Cancer Father   . Hypertension Other   . Diabetes Other   . Diabetes Mother   . Hypertension Mother   . Diabetes Maternal Grandmother   . Hypertension Maternal Grandmother   . Diabetes Paternal Grandfather     Social History Social History   Tobacco Use  . Smoking status: Former Smoker    Packs/day: 0.50  . Smokeless tobacco: Former Systems developer    Quit date: 03/14/2013  Substance Use Topics  . Alcohol use: No    Comment: none since 03-14-14  . Drug use: Yes    Types: Marijuana, Cocaine     Allergies   Cephalexin; Hydrocodone; Neomycin-bacitracin zn-polymyx; Sertraline; and Adhesive [tape]   Review of Systems Review of Systems  Constitutional: Positive for  fatigue. Negative for fever.  HENT: Positive for sore throat.   Respiratory: Positive for cough and shortness of breath.   Cardiovascular:       Chest tightness from cough  Musculoskeletal: Positive for myalgias.  All other systems reviewed and are negative.    Physical Exam Updated Vital Signs BP 131/82 (BP Location: Right Arm)   Pulse 79   Temp 98.1 F (36.7 C) (Oral)   Resp 20   Ht 1.499 m (4' 11" )   Wt 75.3 kg   LMP 12/05/2017   SpO2 100%   BMI 33.53 kg/m   Physical  Exam  CONSTITUTIONAL: Well developed/well nourished HEAD: Normocephalic/atraumatic EYES: EOMI/PERRL ENMT: Mucous membranes moist, mild tenderness to bilateral maxillary sinus.  Nasal congestion noted, uvula midline without erythema or exudate NECK: supple no meningeal signs SPINE/BACK:entire spine nontender CV: S1/S2 noted, no murmurs/rubs/gallops noted LUNGS: Lungs are clear to auscultation bilaterally, no apparent distress ABDOMEN: soft, nontender, no rebound or guarding, bowel sounds noted throughout abdomen GU:no cva tenderness NEURO: Pt is awake/alert/appropriate, moves all extremitiesx4.  No facial droop.   EXTREMITIES: pulses normal/equal, full ROM, no lower extremity edema or tenderness SKIN: warm, color normal PSYCH: no abnormalities of mood noted, alert and oriented to situation  ED Treatments / Results  Labs (all labs ordered are listed, but only abnormal results are displayed) Labs Reviewed - No data to display  EKG None  Radiology No results found.  Procedures Procedures  Medications Ordered in ED Medications  pseudoephedrine (SUDAFED) tablet 30 mg (30 mg Oral Given 01/17/18 0138)     Initial Impression / Assessment and Plan / ED Course  I have reviewed the triage vital signs and the nursing notes.  She is presenting with most likely viral sinusitis.  Her lung sounds are clear, low suspicion for pneumonia.  She is well-appearing and in no distress.  No hypoxia.  She reports  good response to [employer] in the past.  Also feel she would benefit from Sudafed to relieve some of her sinusitis symptoms otherwise patient is well-appearing in no acute distress pt discharged home Final Clinical Impressions(s) / ED Diagnoses   Final diagnoses:  Cough  Viral URI with cough  Acute non-recurrent maxillary sinusitis    ED Discharge Orders         Ordered    benzonatate (TESSALON) 100 MG capsule  Every 8 hours     01/17/18 0149           Ripley Fraise, MD 01/17/18 0424

## 2018-02-01 MED FILL — ALLOPURINOL 100 MG TABLET: 100 | 30 days supply | Qty: 30 | Fill #6

## 2018-02-01 MED FILL — AMLODIPINE BESYLATE 10 MG T: 10 | 90 days supply | Qty: 90 | Fill #3

## 2018-02-16 MED FILL — metFORMIN HCL 1000 MG TABS: 1000 | 30 days supply | Qty: 60 | Fill #5

## 2018-03-05 MED FILL — ALLOPURINOL 100 MG TABLET: 100 | 30 days supply | Qty: 30 | Fill #7

## 2018-03-13 MED FILL — LISINOPRIL 10 MG TABLET: 10 | 90 days supply | Qty: 90 | Fill #3

## 2018-03-30 MED FILL — metFORMIN HCL 1000 MG TABS: 1000 | 30 days supply | Qty: 60 | Fill #6

## 2018-03-30 MED FILL — ALLOPURINOL 100 MG TABLET: 100 | 30 days supply | Qty: 30 | Fill #8

## 2018-04-26 ENCOUNTER — Other Ambulatory Visit: Payer: Self-pay | Admitting: Family Medicine

## 2018-04-26 DIAGNOSIS — I10 Essential (primary) hypertension: Secondary | ICD-10-CM

## 2018-04-26 MED FILL — AMLODIPINE BESYLATE 10 MG T: 10 | 30 days supply | Qty: 30 | Fill #0

## 2018-04-26 MED FILL — ALLOPURINOL 100 MG TABLET: 100 | 30 days supply | Qty: 30 | Fill #9

## 2018-05-14 ENCOUNTER — Other Ambulatory Visit: Payer: Self-pay | Admitting: Family Medicine

## 2018-05-14 DIAGNOSIS — E1165 Type 2 diabetes mellitus with hyperglycemia: Principal | ICD-10-CM

## 2018-05-14 DIAGNOSIS — IMO0001 Reserved for inherently not codable concepts without codable children: Secondary | ICD-10-CM

## 2018-05-14 NOTE — Telephone Encounter (Signed)
Pt states that this medication was supposed to be requested at same time as her amolodopine by the pharmacy, but it wasn't.  Pt is completely out and would like a call when this has been sent in.  Pt can be reached at [phone].

## 2018-05-15 MED FILL — metFORMIN HCL 1000 MG TABS: 1000 | 30 days supply | Qty: 60 | Fill #0

## 2018-05-19 NOTE — Progress Notes (Addendum)
[facility] [address] [place], [place] [address] [phone]  Date:  05/21/2018   Name:  Deborah [name]   DOB:  [birth year]   MRN:  [id number]  PCP:  Darreld Mclean, MD    Chief Complaint: Annual Exam (flu shot, on menstral cycle no pap) and Medication Refill (rx for tessalon, has had in past-chronic cough, would like to keep on hand rx)   History of Present Illness:  Deborah [name] is a 47 y.o. very pleasant female patient who presents with the following:  Here today for complete physical exam.  History of diabetes, hyperlipidemia, hypertension, fatty liver disease, anemia. Last seen by myself a little over a year ago. 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 Per my lab notes from 1 year ago: Your labs are good!  A1c is much better- down from 9.4% at last check, which is great.  Metabolic profile and uric acid level also are ok Please let me know if the increased dose of blood pressure med and the addition of amitriptyline help with your symptoms.  Alert me if you are not doing better! Otherwise please come and see me in 4 months.  Pap: 2017, HPV negative.  Never had an abnormal.  Will do in 6 months at our next visit as she is menstruating now  Mammo: Last in chart from 2013. No recent screening  Labs: Due today- she ate earlier today  Foot exam due Flu shot due- will give today   She is still struggling with the loss of her brother. 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 Uncontrolled type 2 diabetes mellitus without complication, without long-term current use of insulin ([place]) 10/05/2015  . 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 LATERAL EPICONDYLITIS, LEFT 11/03/2009  . MUSCULOSKELETAL PAIN 10/01/2009  . DYSLIPIDEMIA 12/05/2008  . FATTY LIVER DISEASE 10/20/2008  . NUMMULAR ECZEMA 05/22/2008  . FEMALE INFERTILITY 05/01/2007  . ANEMIA, IRON DEFICIENCY 04/03/2007  . SMOKER 11/28/2006  . HYPERTENSION, MILD 11/28/2006  . HEMORRHOIDS, INTERNAL W/O COMPLICATION 33/43/5686  . RHINITIS, ALLERGIC, DUE TO POLLEN 11/28/2006  . GERD 11/28/2006  . DERMATITIS, ATOPIC 11/28/2006  . COCAINE ABUSE, HX OF 11/28/2006  . LIVER FUNCTION TESTS, ABNORMAL 09/14/2006  . GOUT 06/25/2004  . 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 Allergy   . Anemia   . Arthritis   . Diabetes mellitus   . Eczema   . GERD (gastroesophageal reflux disease)   . Gout   . Hyperlipidemia   . Hypertension   . Neuromuscular disorder ([place])   . Polysubstance abuse ([place])    ETOH and Cocaine  . Seasonal allergies     Past Surgical History:  Procedure Laterality Date  . HEMORRHOID SURGERY      Social History   Tobacco Use  . Smoking status: Former Smoker    Packs/day: 0.50  .  Smokeless tobacco: Former Systems developer    Quit date: 03/14/2013  Substance Use Topics  . Alcohol use: No    Comment: none since 03-14-14  . Drug use: Yes    Types: Marijuana, Cocaine    Family History  Problem Relation Age of Onset  . Hypertension Father   . Diabetes Father   . Cancer Father   . Hypertension Other   . Diabetes Other   . Diabetes Mother   . Hypertension Mother   . Diabetes Maternal Grandmother   . Hypertension Maternal Grandmother   . Diabetes Paternal Grandfather     Allergies  Allergen Reactions  . Cephalexin     REACTION: stomach cramps and hives  . Hydrocodone     headache  . Latex   . Neomycin-Bacitracin Zn-Polymyx      REACTION: burning  . Sertraline Hives and Itching  . Adhesive [Tape] Hives and Rash    Medication list has been reviewed and updated.  Current Outpatient Medications on File Prior to Visit  Medication Sig Dispense Refill  . esomeprazole (NEXIUM) 20 MG capsule Take 20 mg by mouth daily at 12 noon.    . triamcinolone cream (KENALOG) 0.1 % Apply 1 application topically 2 (two) times daily. Use as needed for eczema 80 g 2  . blood glucose meter kit and supplies Dispense based on patient and insurance preference. Use 1-2x daily as desired to monitor glucose (FOR ICD-9 250.00, 250.01). (Patient not taking: Reported on 05/21/2018) 1 each 0  . glucose blood (ACCU-CHEK GUIDE) test strip Use as instructed (Patient not taking: Reported on 05/21/2018) 100 each 12  . Lancets (ACCU-CHEK SOFT TOUCH) lancets Use as instructed (Patient not taking: Reported on 05/21/2018) 100 each 12   No current facility-administered medications on file prior to visit.     Review of Systems:  As per HPI- otherwise negative.   Physical Examination: Vitals:   05/21/18 1423 05/21/18 1439  BP: (!) 144/90 125/78  Pulse: 94   Resp: 16   Temp: 98 F (36.7 C)   SpO2: 99%    Vitals:   05/21/18 1423  Weight: 169 lb (76.7 kg)  Height: 4' 11" (1.499 m)   Body mass index is 34.13 kg/m. Ideal Body Weight: Weight in (lb) to have BMI = 25: 123.5  GEN: WDWN, NAD, Non-toxic, A & O x 3, obese, looks well  HEENT: Atraumatic, Normocephalic. Neck supple. No masses, No LAD.  Bilateral TM wnl, oropharynx normal.  PEERL,EOMI.   Ears and Nose: No external deformity. CV: RRR, No M/G/R. No JVD. No thrill. No extra heart sounds. PULM: CTA B, no wheezes, crackles, rhonchi. No retractions. No resp. distress. No accessory muscle use. ABD: S, NT, ND. No rebound. No HSM. EXTR: No c/c/e NEURO Normal gait.  PSYCH: Normally interactive. Conversant. Not depressed or anxious appearing.  Calm demeanor.  Foot exam normal today    Assessment and Plan: Physical exam  Uncontrolled type 2 diabetes mellitus without complication, without long-term current use of insulin ([facility]) - Plan: CBC, Comprehensive metabolic panel, Hemoglobin A1c, lisinopril (PRINIVIL,ZESTRIL) 10 MG tablet, metFORMIN (GLUCOPHAGE) 1000 MG tablet  Essential hypertension - Plan: amLODipine (NORVASC) 10 MG tablet, lisinopril (PRINIVIL,ZESTRIL) 10 MG tablet  Mixed hyperlipidemia - Plan: Lipid panel  Idiopathic chronic gout of multiple sites without tophus - Plan: allopurinol (ZYLOPRIM) 100 MG tablet, indomethacin (INDOCIN) 50 MG capsule  Screening for cervical cancer  Need for influenza vaccination - Plan: Flu vaccine, recombinant, quadrivalent, inj (Flublok quad egg free)  Screening  for breast cancer - Plan: MM DIAG BREAST TOMO BILATERAL  Other allergic rhinitis - Plan: loratadine (CLARITIN) 10 MG tablet   Here today for physical exam. We will be in touch with her pending her labs. Due for Pap today as she is menstruating.  Plan to do at our next visit in about 6 months. Ordered mammogram for today, she will try to get this done ASAP. Signed Lamar Blinks, MD  Received her labs  Results for orders placed or performed in visit on 05/21/18  CBC  Result Value Ref Range   WBC 8.9 4.0 - 10.5 K/uL   RBC 4.53 3.87 - 5.11 Mil/uL   Platelets 274.0 150.0 - 400.0 K/uL   Hemoglobin 12.3 12.0 - 15.0 g/dL   HCT 38.0 36.0 - 46.0 %   MCV 84.1 78.0 - 100.0 fl   MCHC 32.4 30.0 - 36.0 g/dL   RDW 14.1 11.5 - 15.5 %  Comprehensive metabolic panel  Result Value Ref Range   Sodium 140 135 - 145 mEq/L   Potassium 4.0 3.5 - 5.1 mEq/L   Chloride 104 96 - 112 mEq/L   CO2 26 19 - 32 mEq/L   Glucose, Bld 143 (H) 70 - 99 mg/dL   BUN 12 6 - 23 mg/dL   Creatinine, Ser 0.68 0.40 - 1.20 mg/dL   Total Bilirubin 0.2 0.2 - 1.2 mg/dL   Alkaline Phosphatase 89 39 - 117 U/L   AST 13 0 - 37 U/L   ALT 12 0 - 35 U/L   Total Protein 7.3 6.0 - 8.3 g/dL   Albumin 4.6  3.5 - 5.2 g/dL   Calcium 9.8 8.4 - 10.5 mg/dL   GFR 117.83 >60.00 mL/min  Hemoglobin A1c  Result Value Ref Range   Hgb A1c MFr Bld 7.4 (H) 4.6 - 6.5 %  Lipid panel  Result Value Ref Range   Cholesterol 145 0 - 200 mg/dL   Triglycerides 62.0 0.0 - 149.0 mg/dL   HDL 66.90 >39.00 mg/dL   VLDL 12.4 0.0 - 40.0 mg/dL   LDL Cholesterol 66 0 - 99 mg/dL   Total CHOL/HDL Ratio 2    NonHDL 78.14    Letter to pt  She is taking metformin 100 BID Last A1c 7.5 a year ago

## 2018-05-21 ENCOUNTER — Other Ambulatory Visit: Payer: Self-pay | Admitting: Family Medicine

## 2018-05-21 ENCOUNTER — Encounter: Payer: Self-pay | Admitting: Family Medicine

## 2018-05-21 ENCOUNTER — Ambulatory Visit (INDEPENDENT_AMBULATORY_CARE_PROVIDER_SITE_OTHER): Payer: Managed Care, Other (non HMO) | Admitting: Family Medicine

## 2018-05-21 VITALS — BP 125/78 | HR 94 | Temp 98.0°F | Resp 16 | Ht 59.0 in | Wt 169.0 lb

## 2018-05-21 DIAGNOSIS — I10 Essential (primary) hypertension: Secondary | ICD-10-CM

## 2018-05-21 DIAGNOSIS — Z1231 Encounter for screening mammogram for malignant neoplasm of breast: Secondary | ICD-10-CM

## 2018-05-21 DIAGNOSIS — Z0000 Encounter for general adult medical examination without abnormal findings: Secondary | ICD-10-CM | POA: Diagnosis not present

## 2018-05-21 DIAGNOSIS — E1165 Type 2 diabetes mellitus with hyperglycemia: Secondary | ICD-10-CM | POA: Diagnosis not present

## 2018-05-21 DIAGNOSIS — M1A09X0 Idiopathic chronic gout, multiple sites, without tophus (tophi): Secondary | ICD-10-CM

## 2018-05-21 DIAGNOSIS — Z23 Encounter for immunization: Secondary | ICD-10-CM | POA: Diagnosis not present

## 2018-05-21 DIAGNOSIS — E782 Mixed hyperlipidemia: Secondary | ICD-10-CM | POA: Diagnosis not present

## 2018-05-21 DIAGNOSIS — Z1239 Encounter for other screening for malignant neoplasm of breast: Secondary | ICD-10-CM

## 2018-05-21 DIAGNOSIS — J3089 Other allergic rhinitis: Secondary | ICD-10-CM

## 2018-05-21 DIAGNOSIS — IMO0001 Reserved for inherently not codable concepts without codable children: Secondary | ICD-10-CM

## 2018-05-21 DIAGNOSIS — Z124 Encounter for screening for malignant neoplasm of cervix: Secondary | ICD-10-CM

## 2018-05-21 MED ORDER — AMLODIPINE BESYLATE 10 MG PO TABS: 10.0000 mg | ORAL_TABLET | Freq: Every day | ORAL | 11 refills | Status: DC

## 2018-05-21 MED ORDER — ALLOPURINOL 100 MG PO TABS: 100.0000 mg | ORAL_TABLET | Freq: Every day | ORAL | 11 refills | Status: DC

## 2018-05-21 MED ORDER — METFORMIN HCL 1000 MG PO TABS: 1000.0000 mg | ORAL_TABLET | Freq: Two times a day (BID) | ORAL | 11 refills | Status: DC

## 2018-05-21 MED ORDER — INDOMETHACIN 50 MG PO CAPS: 50.0000 mg | ORAL_CAPSULE | Freq: Two times a day (BID) | ORAL | 3 refills | Status: DC

## 2018-05-21 MED ORDER — LISINOPRIL 10 MG PO TABS: 10.0000 mg | ORAL_TABLET | Freq: Every day | ORAL | 3 refills | Status: DC

## 2018-05-21 MED ORDER — LORATADINE 10 MG PO TABS: 10.0000 mg | ORAL_TABLET | Freq: Every day | ORAL | 3 refills | Status: DC

## 2018-05-21 NOTE — Patient Instructions (Signed)
It was great to see you today.  I will be in touch with your labs ASAP. Blood pressure looks great, continue current blood pressure medications. We will check how your diabetes is doing today. Please stop by the imaging department on the ground floor next the elevators.  See if they can do your mammogram today, if not you can schedule for convenient time. Assuming your labs look good let us plan to recheck in 6 months.  We can do your Pap at that time.  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 we are very proud of you.  Health Maintenance, Female Adopting a healthy lifestyle and getting preventive care can go a long way to promote health and wellness. Talk with your health care provider about what schedule of regular examinations is right for you. This is a good chance for you to check in with your provider about disease prevention and staying healthy. In between checkups, there are plenty of things you can do on your own. Experts have done a lot of research about which lifestyle changes and preventive measures are most likely to keep you healthy. Ask your health care provider for more information. Weight and diet Eat a healthy diet  Be sure to include plenty of vegetables, fruits, low-fat dairy products, and lean protein.  Do not eat a lot of foods high in solid fats, added sugars, or salt.  Get regular exercise. This is one of the most important things you can do for your health. ? Most adults should exercise for at least 150 minutes each week. The exercise should increase your heart rate and make you sweat (moderate-intensity exercise). ? Most adults should also do strengthening exercises at least twice a week. This is in addition to the moderate-intensity exercise.  Maintain a healthy weight  Body mass index (BMI) is a measurement that can be used to identify possible weight problems. It estimates body fat based on height and weight. Your health care provider can help determine your BMI  and help you achieve or maintain a healthy weight.  For females 109 years of age and older: ? A BMI below 18.5 is considered underweight. ? A BMI of 18.5 to 24.9 is normal. ? A BMI of 25 to 29.9 is considered overweight. ? A BMI of 30 and above is considered obese.  Watch levels of cholesterol and blood lipids  You should start having your blood tested for lipids and cholesterol at 49 years of age, then have this test every 5 years.  You may need to have your cholesterol levels checked more often if: ? Your lipid or cholesterol levels are high. ? You are older than 49 years of age. ? You are at high risk for heart disease.  Cancer screening Lung Cancer  Lung cancer screening is recommended for adults 90-70 years old who are at high risk for lung cancer because of a history of smoking.  A yearly low-dose CT scan of the lungs is recommended for people who: ? Currently smoke. ? Have quit within the past 15 years. ? Have at least a 30-pack-year history of smoking. A pack year is smoking an average of one pack of cigarettes a day for 1 year.  Yearly screening should continue until it has been 15 years since you quit.  Yearly screening should stop if you develop a health problem that would prevent you from having lung cancer treatment.  Breast Cancer  Practice breast self-awareness. This means understanding how your breasts normally appear  and feel.  It also means doing regular breast self-exams. Let your health care provider know about any changes, no matter how small.  If you are in your 20s or 30s, you should have a clinical breast exam (CBE) by a health care provider every 1-3 years as part of a regular health exam.  If you are 30 or older, have a CBE every year. Also consider having a breast X-ray (mammogram) every year.  If you have a family history of breast cancer, talk to your health care provider about genetic screening.  If you are at high risk for breast cancer, talk  to your health care provider about having an MRI and a mammogram every year.  Breast cancer gene (BRCA) assessment is recommended for women who have family members with BRCA-related cancers. BRCA-related cancers include: ? Breast. ? Ovarian. ? Tubal. ? Peritoneal cancers.  Results of the assessment will determine the need for genetic counseling and BRCA1 and BRCA2 testing.  Cervical Cancer Your health care provider may recommend that you be screened regularly for cancer of the pelvic organs (ovaries, uterus, and vagina). This screening involves a pelvic examination, including checking for microscopic changes to the surface of your cervix (Pap test). You may be encouraged to have this screening done every 3 years, beginning at age 3.  For women ages 45-65, health care providers may recommend pelvic exams and Pap testing every 3 years, or they may recommend the Pap and pelvic exam, combined with testing for human papilloma virus (HPV), every 5 years. Some types of HPV increase your risk of cervical cancer. Testing for HPV may also be done on women of any age with unclear Pap test results.  Other health care providers may not recommend any screening for nonpregnant women who are considered low risk for pelvic cancer and who do not have symptoms. Ask your health care provider if a screening pelvic exam is right for you.  If you have had past treatment for cervical cancer or a condition that could lead to cancer, you need Pap tests and screening for cancer for at least 20 years after your treatment. If Pap tests have been discontinued, your risk factors (such as having a new sexual partner) need to be reassessed to determine if screening should resume. Some women have medical problems that increase the chance of getting cervical cancer. In these cases, your health care provider may recommend more frequent screening and Pap tests.  Colorectal Cancer  This type of cancer can be detected and often  prevented.  Routine colorectal cancer screening usually begins at 49 years of age and continues through 49 years of age.  Your health care provider may recommend screening at an earlier age if you have risk factors for colon cancer.  Your health care provider may also recommend using home test kits to check for hidden blood in the stool.  A small camera at the end of a tube can be used to examine your colon directly (sigmoidoscopy or colonoscopy). This is done to check for the earliest forms of colorectal cancer.  Routine screening usually begins at age 46.  Direct examination of the colon should be repeated every 5-10 years through 49 years of age. However, you may need to be screened more often if early forms of precancerous polyps or small growths are found.  Skin Cancer  Check your skin from head to toe regularly.  Tell your health care provider about any new moles or changes in moles, especially if  there is a change in a mole's shape or color.  Also tell your health care provider if you have a mole that is larger than the size of a pencil eraser.  Always use sunscreen. Apply sunscreen liberally and repeatedly throughout the day.  Protect yourself by wearing long sleeves, pants, a wide-brimmed hat, and sunglasses whenever you are outside.  Heart disease, diabetes, and high blood pressure  High blood pressure causes heart disease and increases the risk of stroke. High blood pressure is more likely to develop in: ? People who have blood pressure in the high end of the normal range (130-139/85-89 mm Hg). ? People who are overweight or obese. ? People who are African American.  If you are 40-55 years of age, have your blood pressure checked every 3-5 years. If you are 73 years of age or older, have your blood pressure checked every year. You should have your blood pressure measured twice-once when you are at a hospital or clinic, and once when you are not at a hospital or clinic.  Record the average of the two measurements. To check your blood pressure when you are not at a hospital or clinic, you can use: ? An automated blood pressure machine at a pharmacy. ? A home blood pressure monitor.  If you are between 30 years and 34 years old, ask your health care provider if you should take aspirin to prevent strokes.  Have regular diabetes screenings. This involves taking a blood sample to check your fasting blood sugar level. ? If you are at a normal weight and have a low risk for diabetes, have this test once every three years after 49 years of age. ? If you are overweight and have a high risk for diabetes, consider being tested at a younger age or more often. Preventing infection Hepatitis B  If you have a higher risk for hepatitis B, you should be screened for this virus. You are considered at high risk for hepatitis B if: ? You were born in a country where hepatitis B is common. Ask your health care provider which countries are considered high risk. ? Your parents were born in a high-risk country, and you have not been immunized against hepatitis B (hepatitis B vaccine). ? You have HIV or AIDS. ? You use needles to inject street drugs. ? You live with someone who has hepatitis B. ? You have had sex with someone who has hepatitis B. ? You get hemodialysis treatment. ? You take certain medicines for conditions, including cancer, organ transplantation, and autoimmune conditions.  Hepatitis C  Blood testing is recommended for: ? Everyone born from [birth year] through 1967. ? Anyone with known risk factors for hepatitis C.  Sexually transmitted infections (STIs)  You should be screened for sexually transmitted infections (STIs) including gonorrhea and chlamydia if: ? You are sexually active and are younger than 49 years of age. ? You are older than 49 years of age and your health care provider tells you that you are at risk for this type of infection. ? Your sexual  activity has changed since you were last screened and you are at an increased risk for chlamydia or gonorrhea. Ask your health care provider if you are at risk.  If you do not have HIV, but are at risk, it may be recommended that you take a prescription medicine daily to prevent HIV infection. This is called pre-exposure prophylaxis (PrEP). You are considered at risk if: ? You are sexually active and  do not regularly use condoms or know the HIV status of your partner(s). ? You take drugs by injection. ? You are sexually active with a partner who has HIV.  Talk with your health care provider about whether you are at high risk of being infected with HIV. If you choose to begin PrEP, you should first be tested for HIV. You should then be tested every 3 months for as long as you are taking PrEP. Pregnancy  If you are premenopausal and you may become pregnant, ask your health care provider about preconception counseling.  If you may become pregnant, take 400 to 800 micrograms (mcg) of folic acid every day.  If you want to prevent pregnancy, talk to your health care provider about birth control (contraception). Osteoporosis and menopause  Osteoporosis is a disease in which the bones lose minerals and strength with aging. This can result in serious bone fractures. Your risk for osteoporosis can be identified using a bone density scan.  If you are 60 years of age or older, or if you are at risk for osteoporosis and fractures, ask your health care provider if you should be screened.  Ask your health care provider whether you should take a calcium or vitamin D supplement to lower your risk for osteoporosis.  Menopause may have certain physical symptoms and risks.  Hormone replacement therapy may reduce some of these symptoms and risks. Talk to your health care provider about whether hormone replacement therapy is right for you. Follow these instructions at home:  Schedule regular health, dental,  and eye exams.  Stay current with your immunizations.  Do not use any tobacco products including cigarettes, chewing tobacco, or electronic cigarettes.  If you are pregnant, do not drink alcohol.  If you are breastfeeding, limit how much and how often you drink alcohol.  Limit alcohol intake to no more than 1 drink per day for nonpregnant women. One drink equals 12 ounces of beer, 5 ounces of wine, or 1 ounces of hard liquor.  Do not use street drugs.  Do not share needles.  Ask your health care provider for help if you need support or information about quitting drugs.  Tell your health care provider if you often feel depressed.  Tell your health care provider if you have ever been abused or do not feel safe at home. This information is not intended to replace advice given to you by your health care provider. Make sure you discuss any questions you have with your health care provider. Document Released: 12/06/2010 Document Revised: 10/29/2015 Document Reviewed: 02/24/2015 Elsevier Interactive Patient Education  [employer].

## 2018-05-22 ENCOUNTER — Ambulatory Visit (HOSPITAL_BASED_OUTPATIENT_CLINIC_OR_DEPARTMENT_OTHER)
Admission: RE | Admit: 2018-05-22 | Discharge: 2018-05-22 | Disposition: A | Discharge status: Home / Self Care | Payer: Managed Care, Other (non HMO) | Source: Ambulatory Visit | Attending: Family Medicine | Admitting: Family Medicine

## 2018-05-22 DIAGNOSIS — Z1231 Encounter for screening mammogram for malignant neoplasm of breast: Secondary | ICD-10-CM | POA: Insufficient documentation

## 2018-05-22 LAB — LIPID PANEL
Cholesterol: 145 mg/dL (ref 0–200)
HDL: 66.9 mg/dL (ref >39.00)
LDL Cholesterol: 66 mg/dL (ref 0–99)
NonHDL: 78.14
Total CHOL/HDL Ratio: 2
Triglycerides: 62 mg/dL (ref 0.0–149.0)
VLDL: 12.4 mg/dL (ref 0.0–40.0)

## 2018-05-22 LAB — CBC
HCT: 38 % (ref 36.0–46.0)
Hemoglobin: 12.3 g/dL (ref 12.0–15.0)
MCHC: 32.4 g/dL (ref 30.0–36.0)
MCV: 84.1 fl (ref 78.0–100.0)
PLATELETS: 274 10*3/uL (ref 150.0–400.0)
RBC: 4.53 Mil/uL (ref 3.87–5.11)
RDW: 14.1 % (ref 11.5–15.5)
WBC: 8.9 10*3/uL (ref 4.0–10.5)

## 2018-05-22 LAB — COMPREHENSIVE METABOLIC PANEL
ALBUMIN: 4.6 g/dL (ref 3.5–5.2)
ALK PHOS: 89 U/L (ref 39–117)
ALT: 12 U/L (ref 0–35)
AST: 13 U/L (ref 0–37)
BILIRUBIN TOTAL: 0.2 mg/dL (ref 0.2–1.2)
BUN: 12 mg/dL (ref 6–23)
CO2: 26 mEq/L (ref 19–32)
CREATININE: 0.68 mg/dL (ref 0.40–1.20)
Calcium: 9.8 mg/dL (ref 8.4–10.5)
Chloride: 104 mEq/L (ref 96–112)
GFR: 117.83 mL/min (ref >60.00)
Glucose, Bld: 143 mg/dL — ABNORMAL HIGH (ref 70–99)
Potassium: 4 mEq/L (ref 3.5–5.1)
Sodium: 140 mEq/L (ref 135–145)
TOTAL PROTEIN: 7.3 g/dL (ref 6.0–8.3)

## 2018-05-22 LAB — HEMOGLOBIN A1C: Hgb A1c MFr Bld: 7.4 % — ABNORMAL HIGH (ref 4.6–6.5)

## 2018-05-28 ENCOUNTER — Encounter: Payer: Self-pay | Admitting: Obstetrics & Gynecology

## 2018-06-04 MED FILL — AMLODIPINE BESYLATE 10 MG T: 10 | 30 days supply | Qty: 30 | Fill #0

## 2018-06-04 MED FILL — ALLOPURINOL 100 MG TABLET: 100 | 30 days supply | Qty: 30 | Fill #0

## 2018-06-11 MED FILL — LISINOPRIL 10 MG TABS: 10 | 90 days supply | Qty: 90 | Fill #0

## 2018-06-11 MED FILL — metFORMIN HCL 1000 MG TABS: 1000 | 30 days supply | Qty: 60 | Fill #0

## 2018-07-02 MED FILL — ALLOPURINOL 100 MG TABS: 100 | 30 days supply | Qty: 30 | Fill #1

## 2018-07-02 MED FILL — AMLODIPINE BESYLATE 10 MG T: 10 | 30 days supply | Qty: 30 | Fill #1

## 2018-07-17 ENCOUNTER — Telehealth: Payer: Self-pay

## 2018-07-17 NOTE — Telephone Encounter (Signed)
Copied from [place] [id number]. Topic: General - Other >> Jul 17, 2018 10:50 AM Carolyn Stare wrote:   She said she has spoke with Dr Lorelei Pont about her issue  Pt call to ask for a referral to see somebody. She said she is having issues with grief and relationships .

## 2018-07-18 NOTE — Telephone Encounter (Signed)
Called her back, no answer but LMOM I was not sure if she wanted to see a therapist or psychiatrist.   Please let me know which she needs and I am happy to help

## 2018-07-19 NOTE — Telephone Encounter (Signed)
Patient called back to say that she need a Therapist. States that she does not think she is in need of any medication. Also wanted to inform that she does not answer numbers she does not know so for anyone calling back to leave a detailed message and she will call back. Ph# [phone]

## 2018-07-19 NOTE — Telephone Encounter (Signed)
Called her and reached her She would like to see a therapist I gave her the number for our [place]

## 2018-07-30 ENCOUNTER — Ambulatory Visit (INDEPENDENT_AMBULATORY_CARE_PROVIDER_SITE_OTHER): Payer: 59 | Admitting: Psychology

## 2018-07-30 DIAGNOSIS — F3289 Other specified depressive episodes: Secondary | ICD-10-CM

## 2018-08-01 MED FILL — metFORMIN HCL 1000 MG TABS: 1000 | 30 days supply | Qty: 60 | Fill #1

## 2018-08-01 MED FILL — ALLOPURINOL 100 MG TABS: 100 | 30 days supply | Qty: 30 | Fill #2

## 2018-08-01 MED FILL — AMLODIPINE BESYLATE 10 MG T: 10 | 30 days supply | Qty: 30 | Fill #2

## 2018-08-06 ENCOUNTER — Ambulatory Visit (INDEPENDENT_AMBULATORY_CARE_PROVIDER_SITE_OTHER): Payer: 59 | Admitting: Psychology

## 2018-08-06 DIAGNOSIS — F3289 Other specified depressive episodes: Secondary | ICD-10-CM

## 2018-08-13 ENCOUNTER — Ambulatory Visit: Payer: 59 | Admitting: Psychology

## 2018-08-27 ENCOUNTER — Ambulatory Visit: Payer: 59 | Admitting: Psychology

## 2018-08-30 MED FILL — AMLODIPINE BESYLATE 10 MG T: 10 | 30 days supply | Qty: 30 | Fill #3

## 2018-08-30 MED FILL — ALLOPURINOL 100 MG TABS: 100 | 30 days supply | Qty: 30 | Fill #3

## 2018-09-03 ENCOUNTER — Ambulatory Visit: Payer: 59 | Admitting: Psychology

## 2018-09-07 MED FILL — metFORMIN HCL 1000 MG TABS: 1000 | 30 days supply | Qty: 60 | Fill #2

## 2018-09-07 MED FILL — LISINOPRIL 10 MG TABLET: 10 | 90 days supply | Qty: 90 | Fill #1

## 2018-09-19 MED FILL — INDOMETHACIN 50 MG CAPSULE: 50 | 30 days supply | Qty: 60 | Fill #0

## 2018-10-01 MED FILL — AMLODIPINE BESYLATE 10 MG T: 10 | 30 days supply | Qty: 30 | Fill #4

## 2018-10-01 MED FILL — ALLOPURINOL 100 MG TABS: 100 | 30 days supply | Qty: 30 | Fill #4

## 2018-10-11 MED FILL — metFORMIN HCL 1000 MG TABS: 1000 | 30 days supply | Qty: 60 | Fill #3

## 2018-10-29 MED FILL — AMLODIPINE BESYLATE 10 MG T: 10 | 30 days supply | Qty: 30 | Fill #5

## 2018-10-29 MED FILL — ALLOPURINOL 100 MG TABS: 100 | 30 days supply | Qty: 30 | Fill #5

## 2018-11-20 MED FILL — metFORMIN HCL 1000 MG TABS: 1000 | 30 days supply | Qty: 60 | Fill #4

## 2018-11-26 MED FILL — ALLOPURINOL 100 MG TABS: 100 | 30 days supply | Qty: 30 | Fill #6

## 2018-11-26 MED FILL — AMLODIPINE BESYLATE 10 MG T: 10 | 30 days supply | Qty: 30 | Fill #6

## 2018-12-06 MED FILL — LISINOPRIL 10 MG TABLET: 10 | 90 days supply | Qty: 90 | Fill #2

## 2018-12-20 ENCOUNTER — Telehealth: Payer: Self-pay

## 2018-12-20 ENCOUNTER — Other Ambulatory Visit: Payer: Self-pay

## 2018-12-20 DIAGNOSIS — Z20822 Contact with and (suspected) exposure to covid-19: Secondary | ICD-10-CM

## 2018-12-20 NOTE — Telephone Encounter (Signed)
Copied from [place] [id number]. Topic: General - Other >> Dec 20, 2018  2:59 PM Valla Leaver wrote: Reason for CRM: Patient would like a letter from Dr. Lorelei Pont writing her out of work until her covid results come back. The letter she got from [facility] did not state how long she will be out of work.

## 2018-12-20 NOTE — Telephone Encounter (Signed)
Called her back- tested today as her mother has covid 33, she is hospitalized but doing ok Wrote letter for her- will mail it to her

## 2018-12-25 LAB — NOVEL CORONAVIRUS, NAA: SARS-CoV-2, NAA: NOT DETECTED

## 2018-12-26 ENCOUNTER — Telehealth: Payer: Self-pay | Admitting: Family Medicine

## 2018-12-26 NOTE — Telephone Encounter (Signed)
Pt returned call. Advised of negative covid result.

## 2018-12-27 MED FILL — ALLOPURINOL 100 MG TABS: 100 | 30 days supply | Qty: 30 | Fill #7

## 2018-12-27 MED FILL — AMLODIPINE BESYLATE 10 MG T: 10 | 30 days supply | Qty: 30 | Fill #7

## 2018-12-28 MED FILL — metFORMIN HCL 1000 MG TABS: 1000 | 30 days supply | Qty: 60 | Fill #5

## 2019-01-28 MED FILL — AMLODIPINE BESYLATE 10 MG T: 10 | 30 days supply | Qty: 30 | Fill #8

## 2019-01-28 MED FILL — ALLOPURINOL 100 MG TABS: 100 | 30 days supply | Qty: 30 | Fill #8

## 2019-02-04 MED FILL — metFORMIN HCL 1000 MG TABS: 1000 | 30 days supply | Qty: 60 | Fill #6

## 2019-02-25 MED FILL — AMLODIPINE BESYLATE 10 MG T: 10 | 30 days supply | Qty: 30 | Fill #9

## 2019-02-25 MED FILL — ALLOPURINOL 100 MG TABS: 100 | 30 days supply | Qty: 30 | Fill #9

## 2019-02-25 MED FILL — LISINOPRIL 10 MG TABS: 10 | 90 days supply | Qty: 90 | Fill #3

## 2019-03-04 LAB — HM DIABETES EYE EXAM

## 2019-03-07 ENCOUNTER — Encounter: Payer: Self-pay | Admitting: Family Medicine

## 2019-03-07 MED FILL — metFORMIN HCL 1000 MG TABS: 1000 | 30 days supply | Qty: 60 | Fill #7

## 2019-03-22 MED FILL — AZITHROMYCIN 250 MG TABLET: 250 | 5 days supply | Qty: 6 | Fill #0

## 2019-03-27 MED FILL — ALLOPURINOL 100 MG TABS: 100 | 30 days supply | Qty: 30 | Fill #10

## 2019-03-27 MED FILL — INDOMETHACIN 50 MG CAPSULE: 50 | 30 days supply | Qty: 60 | Fill #1

## 2019-03-27 MED FILL — AMLODIPINE BESYLATE 10 MG T: 10 | 30 days supply | Qty: 30 | Fill #10

## 2019-03-31 NOTE — Progress Notes (Addendum)
[facility] [place], [place], [place] [address] [phone]  Date:  04/01/2019   Name:  Deborah [name]   DOB:  04-May-1971   MRN:  [id number]  PCP:  Darreld Mclean, MD    Chief Complaint: Diabetes   History of Present Illness:  Deborah [name] is a 48 y.o. very pleasant female patient who presents with the following:  Here today for routine follow-up visit Patient with history of hypertension, diabetes, fatty liver, dyslipidemia, gout Last seen by myself December 2019 for a physical-at that time she was working at the [employer] [facility].  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 She would prefer colonoscopy for screening  Pap-appears to be due, last done 2017 A1c is due Flu shot- give today  Do foot exam today Most recent labs December 2019  Allopurinol Amlodipine Lisinopril 10 Metformin thousand twice daily  She has a ?cyst on her right buttock which is not getting larger, not tender   Lab Results  Component Value Date   HGBA1C 7.4 (H) 05/21/2018    Patient Active Problem List   Diagnosis Date Noted  . Uncontrolled type 2 diabetes mellitus without complication, without long-term current use of insulin 10/05/2015  . 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 MUSCULOSKELETAL PAIN  10/01/2009  . DYSLIPIDEMIA 12/05/2008  . FATTY LIVER DISEASE 10/20/2008  . NUMMULAR ECZEMA 05/22/2008  . FEMALE INFERTILITY 05/01/2007  . ANEMIA, IRON DEFICIENCY 04/03/2007  . SMOKER 11/28/2006  . HYPERTENSION, MILD 11/28/2006  . HEMORRHOIDS, INTERNAL W/O COMPLICATION [id number]  . RHINITIS, ALLERGIC, DUE TO POLLEN 11/28/2006  . GERD 11/28/2006  . DERMATITIS, ATOPIC 11/28/2006  . COCAINE ABUSE, HX OF 11/28/2006  . LIVER FUNCTION TESTS, ABNORMAL 09/14/2006  . GOUT 06/25/2004  . 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 Allergy   . Anemia   . Arthritis   . Diabetes mellitus   . Eczema   . GERD (gastroesophageal reflux disease)   . Gout   . Hyperlipidemia   . Hypertension   . Neuromuscular disorder ([place])   . Polysubstance abuse ([place])    ETOH and Cocaine  . Seasonal allergies     Past Surgical History:  Procedure Laterality Date  . HEMORRHOID SURGERY      Social History   Tobacco Use  . Smoking status: Former Smoker    Packs/day: 0.50  . Smokeless tobacco: Former Systems developer    Quit date: 03/14/2013  Substance Use Topics  . Alcohol use: No    Comment: none since 03-14-14  . Drug use: Yes    Types: Marijuana, Cocaine    Family History  Problem Relation Age of Onset  . Hypertension  Father   . Diabetes Father   . Cancer Father   . Hypertension Other   . Diabetes Other   . Diabetes Mother   . Hypertension Mother   . Diabetes Maternal Grandmother   . Hypertension Maternal Grandmother   . Diabetes Paternal Grandfather     Allergies  Allergen Reactions  . Cephalexin     REACTION: stomach cramps and hives  . Hydrocodone     headache  . Latex   . Neomycin-Bacitracin Zn-Polymyx     REACTION: burning  . Sertraline Hives and Itching  . Adhesive [Tape] Hives and Rash    Medication list has been reviewed and updated.  Current Outpatient Medications on File Prior to Visit  Medication Sig Dispense Refill  . esomeprazole  (NEXIUM) 20 MG capsule Take 20 mg by mouth daily at 12 noon.    . loratadine (CLARITIN) 10 MG tablet Take 1 tablet (10 mg total) by mouth daily. 100 tablet 3   No current facility-administered medications on file prior to visit.     Review of Systems:  As per HPI- otherwise negative.   Physical Examination: Vitals:   04/01/19 1341 04/01/19 1413  BP: 138/82   Pulse: (!) 109 90  Resp: 19   Temp: (!) 97.5 F (36.4 C)   SpO2: 98%    Vitals:   04/01/19 1341  Weight: 176 lb (79.8 kg)  Height: 4\' 11"  (1.499 m)   Body mass index is 35.55 kg/m. Ideal Body Weight: Weight in (lb) to have BMI = 25: 123.5  GEN: WDWN, NAD, Non-toxic, A & O x 3, obese, looks well No palpable abnl of her scalp  HEENT: Atraumatic, Normocephalic. Neck supple. No masses, No LAD.  TM wnl, PEERL Ears and Nose: No external deformity. CV: RRR, No M/G/R. No JVD. No thrill. No extra heart sounds. PULM: CTA B, no wheezes, crackles, rhonchi. No retractions. No resp. distress. No accessory muscle use. ABD: S, NT, ND, +BS. No rebound. No HSM. EXTR: No c/c/e NEURO Normal gait.  PSYCH: Normally interactive. Conversant. Not depressed or anxious appearing.  Calm demeanor.  Foot exam normal today Pap collected - normal vagina, cervix and external genitals Grape sized firm mobile mass in the right buttock- cyst?   Pulse Readings from Last 3 Encounters:  04/01/19 90  05/21/18 94  01/17/18 79    Assessment and Plan: Mixed hyperlipidemia - Plan: Lipid panel  Essential hypertension - Plan: CBC, Comprehensive metabolic panel, amLODipine (NORVASC) 10 MG tablet, lisinopril (ZESTRIL) 10 MG tablet  Screening for cervical cancer - Plan: Cytology - PAP  Screening for thyroid disorder - Plan: TSH  Other iron deficiency anemia - Plan: Ferritin  Controlled type 2 diabetes mellitus without complication, without long-term current use of insulin (HCC) - Plan: Comprehensive metabolic panel, Hemoglobin A1c  Screening for  colon cancer - Plan: Ambulatory referral to Gastroenterology  Frequent headaches - Plan: SUMAtriptan (IMITREX) 50 MG tablet, DISCONTINUED: SUMAtriptan (IMITREX) 50 MG tablet  Cyst of buttocks - Plan: US SOFT TISSUE LOWER EXTREMITY LIMITED RIGHT (NON-VASCULAR)  Idiopathic chronic gout of multiple sites without tophus - Plan: allopurinol (ZYLOPRIM) 100 MG tablet, indomethacin (INDOCIN) 50 MG capsule  Controlled type 2 diabetes mellitus with complication, without long-term current use of insulin (HCC) - Plan: lisinopril (ZESTRIL) 10 MG tablet, metFORMIN (GLUCOPHAGE) 1000 MG tablet  Eczema, unspecified type - Plan: triamcinolone cream (KENALOG) 0.1 %  Following up today Labs, pap pending as above Refilled meds BP well controlled Referral to GI for colon  cancer screening US for mass on buttock to confirm cyst Try imitrex for headaches - asked her to let me know if this works    Signed Lamar Blinks, MD  Procedure labs 10/30, message to patient A1c is stable, improved from 2 years ago but still slightly above goal  Results for orders placed or performed in visit on 04/01/19  CBC  Result Value Ref Range   WBC 8.9 4.0 - 10.5 K/uL   RBC 4.39 3.87 - 5.11 Mil/uL   Platelets 264.0 150.0 - 400.0 K/uL   Hemoglobin 12.0 12.0 - 15.0 g/dL   HCT 36.9 36.0 - 46.0 %   MCV 83.9 78.0 - 100.0 fl   MCHC 32.6 30.0 - 36.0 g/dL   RDW 14.3 11.5 - 15.5 %  Comprehensive metabolic panel  Result Value Ref Range   Sodium 140 135 - 145 mEq/L   Potassium 4.1 3.5 - 5.1 mEq/L   Chloride 102 96 - 112 mEq/L   CO2 30 19 - 32 mEq/L   Glucose, Bld 143 (H) 70 - 99 mg/dL   BUN 11 6 - 23 mg/dL   Creatinine, Ser 0.93 0.40 - 1.20 mg/dL   Total Bilirubin 0.3 0.2 - 1.2 mg/dL   Alkaline Phosphatase 90 39 - 117 U/L   AST 14 0 - 37 U/L   ALT 12 0 - 35 U/L   Total Protein 7.2 6.0 - 8.3 g/dL   Albumin 4.4 3.5 - 5.2 g/dL   Calcium 9.5 8.4 - 10.5 mg/dL   GFR 76.98 >60.00 mL/min  Hemoglobin A1c  Result Value Ref  Range   Hgb A1c MFr Bld 7.5 (H) 4.6 - 6.5 %  Lipid panel  Result Value Ref Range   Cholesterol 159 0 - 200 mg/dL   Triglycerides 104.0 0.0 - 149.0 mg/dL   HDL 70.50 >39.00 mg/dL   VLDL 20.8 0.0 - 40.0 mg/dL   LDL Cholesterol 68 0 - 99 mg/dL   Total CHOL/HDL Ratio 2    NonHDL 88.91   TSH  Result Value Ref Range   TSH 1.29 0.35 - 4.50 uIU/mL  Ferritin  Result Value Ref Range   Ferritin 12.3 10.0 - 291.0 ng/mL  Cytology - PAP  Result Value Ref Range   High risk HPV Negative    Adequacy      Satisfactory for evaluation; transformation zone component ABSENT.   Diagnosis      - Negative for intraepithelial lesion or malignancy (NILM)   Comment Normal Reference Range HPV - Negative    Blood counts are normal Metabolic profile looks good Your cholesterol looks great Thyroid is normal Iron level is normal Pap looks fine, negative for any sign of cancer and HPV negative  Your hemoglobin A1c is still slightly above goal.  I would like to get you to about 7% or slightly less.  You are already on maximum dose of Metformin.  I was thinking of adding a low-dose of a second medication if you would be willing-please let me know your thoughts, and I can prescribe a second diabetes medication if you like  Otherwise, you can work on diet and exercise which may also get your A1c to 7%  In any case, please see me in about 4 months for follow-up

## 2019-04-01 ENCOUNTER — Other Ambulatory Visit: Payer: Self-pay

## 2019-04-01 ENCOUNTER — Other Ambulatory Visit (HOSPITAL_COMMUNITY)
Admission: RE | Admit: 2019-04-01 | Discharge: 2019-04-01 | Disposition: A | Discharge status: Home / Self Care | Payer: Managed Care, Other (non HMO) | Source: Ambulatory Visit | Attending: Family Medicine | Admitting: Family Medicine

## 2019-04-01 ENCOUNTER — Ambulatory Visit: Payer: Managed Care, Other (non HMO) | Admitting: Family Medicine

## 2019-04-01 ENCOUNTER — Encounter: Payer: Self-pay | Admitting: Family Medicine

## 2019-04-01 VITALS — BP 138/82 | HR 90 | Temp 97.5°F | Resp 19 | Ht 59.0 in | Wt 176.0 lb

## 2019-04-01 DIAGNOSIS — Z1211 Encounter for screening for malignant neoplasm of colon: Secondary | ICD-10-CM

## 2019-04-01 DIAGNOSIS — R519 Headache, unspecified: Secondary | ICD-10-CM

## 2019-04-01 DIAGNOSIS — E782 Mixed hyperlipidemia: Secondary | ICD-10-CM

## 2019-04-01 DIAGNOSIS — Z124 Encounter for screening for malignant neoplasm of cervix: Secondary | ICD-10-CM

## 2019-04-01 DIAGNOSIS — E119 Type 2 diabetes mellitus without complications: Secondary | ICD-10-CM

## 2019-04-01 DIAGNOSIS — D508 Other iron deficiency anemias: Secondary | ICD-10-CM

## 2019-04-01 DIAGNOSIS — Z1329 Encounter for screening for other suspected endocrine disorder: Secondary | ICD-10-CM | POA: Diagnosis not present

## 2019-04-01 DIAGNOSIS — L729 Follicular cyst of the skin and subcutaneous tissue, unspecified: Secondary | ICD-10-CM

## 2019-04-01 DIAGNOSIS — E118 Type 2 diabetes mellitus with unspecified complications: Secondary | ICD-10-CM

## 2019-04-01 DIAGNOSIS — Z23 Encounter for immunization: Secondary | ICD-10-CM | POA: Diagnosis not present

## 2019-04-01 DIAGNOSIS — M1A09X0 Idiopathic chronic gout, multiple sites, without tophus (tophi): Secondary | ICD-10-CM

## 2019-04-01 DIAGNOSIS — I10 Essential (primary) hypertension: Secondary | ICD-10-CM

## 2019-04-01 DIAGNOSIS — L309 Dermatitis, unspecified: Secondary | ICD-10-CM

## 2019-04-01 MED ORDER — METFORMIN HCL 1000 MG PO TABS: 1000.0000 mg | ORAL_TABLET | Freq: Two times a day (BID) | ORAL | 11 refills | Status: DC

## 2019-04-01 MED ORDER — LISINOPRIL 10 MG PO TABS: 10.0000 mg | ORAL_TABLET | Freq: Every day | ORAL | 11 refills | Status: DC

## 2019-04-01 MED ORDER — INDOMETHACIN 50 MG PO CAPS: 50.0000 mg | ORAL_CAPSULE | Freq: Two times a day (BID) | ORAL | 3 refills | Status: DC

## 2019-04-01 MED ORDER — SUMATRIPTAN SUCCINATE 50 MG PO TABS: 50.0000 mg | ORAL_TABLET | ORAL | 0 refills | Status: DC | PRN

## 2019-04-01 MED ORDER — SUMATRIPTAN SUCCINATE 50 MG PO TABS: ORAL_TABLET | ORAL | 0 refills | Status: DC

## 2019-04-01 MED ORDER — ALLOPURINOL 100 MG PO TABS: 100.0000 mg | ORAL_TABLET | Freq: Every day | ORAL | 11 refills | Status: DC

## 2019-04-01 MED ORDER — TRIAMCINOLONE ACETONIDE 0.1 % EX CREA: 1.0000 "application " | TOPICAL_CREAM | Freq: Two times a day (BID) | CUTANEOUS | 2 refills | Status: DC

## 2019-04-01 MED ORDER — AMLODIPINE BESYLATE 10 MG PO TABS: 10.0000 mg | ORAL_TABLET | Freq: Every day | ORAL | 11 refills | Status: DC

## 2019-04-01 MED FILL — SUMATRIPTAN SUCC 50 MG TAB: 50 | 23 days supply | Qty: 9 | Fill #0

## 2019-04-01 MED FILL — TRIAMCINOLONE ACETONIDE 0.1: 0.1 | 30 days supply | Qty: 80 | Fill #0

## 2019-04-01 MED FILL — metFORMIN HCL 1000 MG TABS: 1000 | 30 days supply | Qty: 60 | Fill #0

## 2019-04-01 NOTE — Patient Instructions (Addendum)
Great to see you again today Flu shot given today Pap pending Labs pending- I will be in touch  For headache, try taking a dose of imitex at the first sign of headache - let me know how this works for you  We will refer you to GI for a screening colonoscopy for colon cancer

## 2019-04-02 ENCOUNTER — Encounter: Payer: Self-pay | Admitting: Gastroenterology

## 2019-04-02 LAB — LIPID PANEL
Cholesterol: 159 mg/dL (ref 0–200)
HDL: 70.5 mg/dL (ref >39.00)
LDL Cholesterol: 68 mg/dL (ref 0–99)
NonHDL: 88.91
Total CHOL/HDL Ratio: 2
Triglycerides: 104 mg/dL (ref 0.0–149.0)
VLDL: 20.8 mg/dL (ref 0.0–40.0)

## 2019-04-02 LAB — COMPREHENSIVE METABOLIC PANEL
ALT: 12 U/L (ref 0–35)
AST: 14 U/L (ref 0–37)
Albumin: 4.4 g/dL (ref 3.5–5.2)
Alkaline Phosphatase: 90 U/L (ref 39–117)
BUN: 11 mg/dL (ref 6–23)
CO2: 30 mEq/L (ref 19–32)
Calcium: 9.5 mg/dL (ref 8.4–10.5)
Chloride: 102 mEq/L (ref 96–112)
Creatinine, Ser: 0.93 mg/dL (ref 0.40–1.20)
GFR: 76.98 mL/min (ref >60.00)
Glucose, Bld: 143 mg/dL — ABNORMAL HIGH (ref 70–99)
Potassium: 4.1 mEq/L (ref 3.5–5.1)
Sodium: 140 mEq/L (ref 135–145)
Total Bilirubin: 0.3 mg/dL (ref 0.2–1.2)
Total Protein: 7.2 g/dL (ref 6.0–8.3)

## 2019-04-02 LAB — CBC
HCT: 36.9 % (ref 36.0–46.0)
Hemoglobin: 12 g/dL (ref 12.0–15.0)
MCHC: 32.6 g/dL (ref 30.0–36.0)
MCV: 83.9 fl (ref 78.0–100.0)
Platelets: 264 10*3/uL (ref 150.0–400.0)
RBC: 4.39 Mil/uL (ref 3.87–5.11)
RDW: 14.3 % (ref 11.5–15.5)
WBC: 8.9 10*3/uL (ref 4.0–10.5)

## 2019-04-02 LAB — HEMOGLOBIN A1C: Hgb A1c MFr Bld: 7.5 % — ABNORMAL HIGH (ref 4.6–6.5)

## 2019-04-03 ENCOUNTER — Encounter: Payer: Self-pay | Admitting: Family Medicine

## 2019-04-03 LAB — CYTOLOGY - PAP
Adequacy: ABSENT
Comment: NEGATIVE
Diagnosis: NEGATIVE
High risk HPV: NEGATIVE

## 2019-04-04 LAB — FERRITIN: Ferritin: 12.3 ng/mL (ref 10.0–291.0)

## 2019-04-04 LAB — TSH: TSH: 1.29 u[IU]/mL (ref 0.35–4.50)

## 2019-04-05 ENCOUNTER — Encounter: Payer: Self-pay | Admitting: Family Medicine

## 2019-04-05 MED ORDER — FARXIGA 5 MG PO TABS: 5.0000 mg | ORAL_TABLET | Freq: Every day | ORAL | 6 refills | Status: DC

## 2019-04-05 MED FILL — FARXIGA 5 MG TABLET: 5 | 30 days supply | Qty: 30 | Fill #0

## 2019-04-08 ENCOUNTER — Telehealth: Payer: Self-pay

## 2019-04-08 ENCOUNTER — Encounter: Payer: Self-pay | Admitting: Family Medicine

## 2019-04-08 DIAGNOSIS — E119 Type 2 diabetes mellitus without complications: Secondary | ICD-10-CM

## 2019-04-08 MED ORDER — GLIPIZIDE ER 2.5 MG PO TB24: 2.5000 mg | ORAL_TABLET | Freq: Every day | ORAL | 11 refills | Status: DC

## 2019-04-08 MED FILL — glipiZIDE ER 2.5 MG TB24: 2.5 | 30 days supply | Qty: 30 | Fill #0

## 2019-04-08 NOTE — Addendum Note (Signed)
Addended by: Lamar Blinks C on: 04/08/2019 01:56 PM   Modules accepted: Orders

## 2019-04-08 NOTE — Telephone Encounter (Signed)
Copied from [place] [id number]. Topic: General - Inquiry >> Apr 08, 2019 11:40 AM Richardo Priest, NT wrote: Reason for CRM: Pt called in stating the dapagliflozin propanediol (FARXIGA) 5 MG TABS tablet medication is too expensive with insurance included and she is wanting an alternative. Please advise.

## 2019-04-12 ENCOUNTER — Other Ambulatory Visit: Payer: Self-pay

## 2019-04-12 ENCOUNTER — Encounter: Payer: Self-pay | Admitting: Gastroenterology

## 2019-04-12 ENCOUNTER — Ambulatory Visit (AMBULATORY_SURGERY_CENTER): Payer: Managed Care, Other (non HMO) | Admitting: *Deleted

## 2019-04-12 VITALS — Temp 97.8°F | Ht 59.0 in | Wt 177.0 lb

## 2019-04-12 DIAGNOSIS — Z1211 Encounter for screening for malignant neoplasm of colon: Secondary | ICD-10-CM

## 2019-04-12 DIAGNOSIS — Z1159 Encounter for screening for other viral diseases: Secondary | ICD-10-CM

## 2019-04-12 MED ORDER — NA SULFATE-K SULFATE-MG SULF 17.5-3.13-1.6 GM/177ML PO SOLN: ORAL | 0 refills | Status: DC

## 2019-04-12 MED FILL — SUPREP BOWEL PREP KIT: 17.5-3.13-1 | 1 days supply | Qty: 354 | Fill #0

## 2019-04-12 NOTE — Progress Notes (Signed)
Patient is here in-person for PV. Patient denies any allergies to eggs or soy. Patient denies any problems with anesthesia/sedation. Patient denies any oxygen use at home. Patient denies taking any diet/weight loss medications or blood thinners. Patient is not being treated for MRSA or C-diff. EMMI education assisgned to the patient for the procedure, this was explained and instructions given to patient. COVID-19 screening test is on 11/16 925 am, the pt is aware. Pt is aware that care partner will wait in the car during procedure; if they feel like they will be too hot or cold to wait in the car; they may wait in the 4 th floor lobby. Patient is aware to bring only one care partner. We want them to wear a mask (we do not have any that we can provide them), practice social distancing, and we will check their temperatures when they get here.  I did remind the patient that their care partner needs to stay in the parking lot the entire time and have a cell phone available, we will call them when the pt is ready for discharge. Patient will wear mask into building.    Suprep $15 off coupon given to the patient.

## 2019-04-15 ENCOUNTER — Other Ambulatory Visit (HOSPITAL_BASED_OUTPATIENT_CLINIC_OR_DEPARTMENT_OTHER): Payer: Self-pay | Admitting: Family Medicine

## 2019-04-15 DIAGNOSIS — Z1231 Encounter for screening mammogram for malignant neoplasm of breast: Secondary | ICD-10-CM

## 2019-04-16 ENCOUNTER — Other Ambulatory Visit: Payer: Self-pay

## 2019-04-16 ENCOUNTER — Telehealth: Payer: Self-pay | Admitting: Family Medicine

## 2019-04-16 ENCOUNTER — Encounter: Payer: Self-pay | Admitting: Family Medicine

## 2019-04-16 ENCOUNTER — Ambulatory Visit (HOSPITAL_BASED_OUTPATIENT_CLINIC_OR_DEPARTMENT_OTHER)
Admission: RE | Admit: 2019-04-16 | Discharge: 2019-04-16 | Disposition: A | Discharge status: Home / Self Care | Payer: Managed Care, Other (non HMO) | Source: Ambulatory Visit | Attending: Family Medicine | Admitting: Family Medicine

## 2019-04-16 DIAGNOSIS — E1165 Type 2 diabetes mellitus with hyperglycemia: Secondary | ICD-10-CM

## 2019-04-16 DIAGNOSIS — L729 Follicular cyst of the skin and subcutaneous tissue, unspecified: Secondary | ICD-10-CM | POA: Diagnosis not present

## 2019-04-16 MED ORDER — BLOOD GLUCOSE METER KIT: PACK | 0 refills | Status: DC

## 2019-04-16 NOTE — Telephone Encounter (Signed)
Prescription sent in  

## 2019-04-16 NOTE — Telephone Encounter (Signed)
Patient requesting a monitor to check her sugar as well as some testing strips. [place] [employer].

## 2019-04-22 ENCOUNTER — Other Ambulatory Visit: Payer: Self-pay | Admitting: Gastroenterology

## 2019-04-22 ENCOUNTER — Ambulatory Visit (INDEPENDENT_AMBULATORY_CARE_PROVIDER_SITE_OTHER): Payer: Managed Care, Other (non HMO)

## 2019-04-22 DIAGNOSIS — Z1159 Encounter for screening for other viral diseases: Secondary | ICD-10-CM

## 2019-04-23 ENCOUNTER — Telehealth: Payer: Self-pay | Admitting: Family Medicine

## 2019-04-23 LAB — SARS CORONAVIRUS 2 (TAT 6-24 HRS): SARS Coronavirus 2: NEGATIVE

## 2019-04-23 NOTE — Telephone Encounter (Signed)
Patient came into the office to request a "Tens 2500" machine prescription? Patient informed me that she was seen for back issues in the past and went to PT for it. Patient was advised that if the physical therapist gave her the machine, she would more than likely have to go back to the for this prescription machine. Please advise. Patient tried to send mychart message to PCP to request but was unable to send it for some technical reason.

## 2019-04-24 ENCOUNTER — Encounter: Payer: Self-pay | Admitting: Family Medicine

## 2019-04-24 MED FILL — AMOXICILLIN 500 MG CAPSULE: 500 | 7 days supply | Qty: 21 | Fill #0

## 2019-04-24 MED FILL — HYDROCODON-APAP 7.5-325: 7.5-325 | 2 days supply | Qty: 16 | Fill #0

## 2019-04-24 NOTE — Telephone Encounter (Signed)
Can we even right an rx for tens units?

## 2019-04-25 ENCOUNTER — Other Ambulatory Visit: Payer: Self-pay | Admitting: Family Medicine

## 2019-04-25 ENCOUNTER — Ambulatory Visit (AMBULATORY_SURGERY_CENTER): Payer: Managed Care, Other (non HMO) | Admitting: Gastroenterology

## 2019-04-25 ENCOUNTER — Encounter: Payer: Self-pay | Admitting: Gastroenterology

## 2019-04-25 ENCOUNTER — Other Ambulatory Visit: Payer: Self-pay

## 2019-04-25 VITALS — BP 120/64 | HR 73 | Temp 98.0°F | Resp 16 | Ht 59.0 in | Wt 177.0 lb

## 2019-04-25 DIAGNOSIS — K635 Polyp of colon: Secondary | ICD-10-CM | POA: Diagnosis not present

## 2019-04-25 DIAGNOSIS — Z1211 Encounter for screening for malignant neoplasm of colon: Secondary | ICD-10-CM

## 2019-04-25 DIAGNOSIS — D120 Benign neoplasm of cecum: Secondary | ICD-10-CM

## 2019-04-25 MED ORDER — SODIUM CHLORIDE 0.9 % IV SOLN: 500.0000 mL | INTRAVENOUS | Status: DC

## 2019-04-25 NOTE — Progress Notes (Signed)
Patient needs a follow up appointment at first available for constipation and fecal incontinence.

## 2019-04-25 NOTE — Progress Notes (Signed)
Temp JB V/s [place]  I have reviewed the patient's medical history in detail and updated the computerized patient record. 

## 2019-04-25 NOTE — Progress Notes (Signed)
Called to room to assist during endoscopic procedure.  Patient ID and intended procedure confirmed with present staff. Received instructions for my participation in the procedure from the performing physician.  

## 2019-04-25 NOTE — Progress Notes (Signed)
PT taken to PACU. Monitors in place. VSS. Report given to RN. 

## 2019-04-25 NOTE — Patient Instructions (Signed)

## 2019-04-25 NOTE — Op Note (Addendum)
[place] Patient Name: Deborah [name] Procedure Date: 04/25/2019 7:57 AM MRN: [id number] Endoscopist: Mauri Pole , MD Age: 48 Referring MD:  Date of Birth: [birth year] Gender: Female Account #: [id number] Procedure:                Colonoscopy Indications:              Screening for colorectal malignant neoplasm Medicines:                Monitored Anesthesia Care Procedure:                Pre-Anesthesia Assessment:                           - Prior to the procedure, a History and Physical                            was performed, and patient medications and                            allergies were reviewed. The patient's tolerance of                            previous anesthesia was also reviewed. The risks                            and benefits of the procedure and the sedation                            options and risks were discussed with the patient.                            All questions were answered, and informed consent                            was obtained. Prior Anticoagulants: The patient has                            taken no previous anticoagulant or antiplatelet                            agents. ASA Grade Assessment: II - A patient with                            mild systemic disease. After reviewing the risks                            and benefits, the patient was deemed in                            satisfactory condition to undergo the procedure.                           After obtaining informed consent, the colonoscope  was passed under direct vision. Throughout the                            procedure, the patient's blood pressure, pulse, and                            oxygen saturations were monitored continuously. The                            Colonoscope was introduced through the anus and                            advanced to the the cecum, identified by                            appendiceal orifice  and ileocecal valve. The                            colonoscopy was performed without difficulty. The                            patient tolerated the procedure well. The quality                            of the bowel preparation was excellent. The                            ileocecal valve, appendiceal orifice, and rectum                            were photographed. Scope In: 8:09:40 AM Scope Out: 8:22:36 AM Scope Withdrawal Time: 0 hours 9 minutes 45 seconds  Total Procedure Duration: 0 hours 12 minutes 56 seconds  Findings:                 The perianal and digital rectal examinations were                            normal.                           Two sessile polyps were found in the cecum. The                            polyps were 1 to 2 mm in size. These polyps were                            removed with a cold biopsy forceps. Resection and                            retrieval were complete.                           Scattered small-mouthed diverticula were found in  the sigmoid colon, descending colon, transverse                            colon and ascending colon.                           A circumferential scar scar was found in the                            rectum. The scar was unremarkable in appearance. Complications:            No immediate complications. Estimated Blood Loss:     Estimated blood loss was minimal. Impression:               - Two 1 to 2 mm polyps in the cecum, removed with a                            cold biopsy forceps. Resected and retrieved.                           - Diverticulosis in the sigmoid colon, in the                            descending colon, in the transverse colon and in                            the ascending colon.                           - Scar in the rectum. Recommendation:           - Patient has a contact number available for                            emergencies. The signs and symptoms of  potential                            delayed complications were discussed with the                            patient. Return to normal activities tomorrow.                            Written discharge instructions were provided to the                            patient.                           - Resume previous diet.                           - Continue present medications.                           - Await pathology results.                           -  Repeat colonoscopy in 5-10 years for surveillance                            based on pathology results.                           - Return to GI clinic at the next available                            appointment for fecal incontinence and constipation. Mauri Pole, MD 04/25/2019 8:31:42 AM This report has been signed electronically.

## 2019-04-29 ENCOUNTER — Telehealth: Payer: Self-pay | Admitting: *Deleted

## 2019-04-29 MED FILL — AMLODIPINE BESYLATE 10 MG T: 10 | 30 days supply | Qty: 30 | Fill #11

## 2019-04-29 MED FILL — ALLOPURINOL 100 MG TABS: 100 | 30 days supply | Qty: 30 | Fill #11

## 2019-04-29 NOTE — Telephone Encounter (Signed)
Called follw up and wrong number left for follow up

## 2019-04-29 NOTE — Telephone Encounter (Signed)
Left message on f/u call 

## 2019-05-01 ENCOUNTER — Encounter: Payer: Self-pay | Admitting: Gastroenterology

## 2019-05-08 MED FILL — HYDROCODON-APAP 7.5-325: 7.5-325 | 2 days supply | Qty: 20 | Fill #0

## 2019-05-08 MED FILL — CHLORHEXIDINE 0.12% RINSE: 0.12 | 16 days supply | Qty: 473 | Fill #0

## 2019-05-08 MED FILL — AMOXICILLIN 500 MG CAPSULE: 500 | 7 days supply | Qty: 21 | Fill #0

## 2019-05-09 MED FILL — glipiZIDE ER 2.5 MG TB24: 2.5 | 30 days supply | Qty: 30 | Fill #1

## 2019-05-09 MED FILL — metFORMIN HCL 1000 MG TABS: 1000 | 30 days supply | Qty: 60 | Fill #1

## 2019-05-27 ENCOUNTER — Ambulatory Visit (HOSPITAL_BASED_OUTPATIENT_CLINIC_OR_DEPARTMENT_OTHER)
Admission: RE | Admit: 2019-05-27 | Discharge: 2019-05-27 | Disposition: A | Discharge status: Home / Self Care | Payer: Managed Care, Other (non HMO) | Source: Ambulatory Visit | Attending: Family Medicine | Admitting: Family Medicine

## 2019-05-27 ENCOUNTER — Other Ambulatory Visit: Payer: Self-pay

## 2019-05-27 ENCOUNTER — Telehealth: Payer: Self-pay | Admitting: Family Medicine

## 2019-05-27 ENCOUNTER — Encounter (HOSPITAL_BASED_OUTPATIENT_CLINIC_OR_DEPARTMENT_OTHER): Payer: Self-pay

## 2019-05-27 DIAGNOSIS — Z1231 Encounter for screening mammogram for malignant neoplasm of breast: Secondary | ICD-10-CM | POA: Insufficient documentation

## 2019-05-27 MED FILL — LISINOPRIL 10 MG TABS: 10 | 30 days supply | Qty: 30 | Fill #0

## 2019-05-27 MED FILL — AMLODIPINE BESYLATE 10 MG T: 10 | 30 days supply | Qty: 30 | Fill #0

## 2019-05-27 MED FILL — ALLOPURINOL 100 MG TABS: 100 | 30 days supply | Qty: 30 | Fill #0

## 2019-05-27 NOTE — Telephone Encounter (Signed)
Pt came in requesting refill on allopurinol (ZYLOPRIM) 100 MG tablet, amLODipine (NORVASC) 10 MG tablet and lisinopril (ZESTRIL) 10 MG tablet. Pt would like to have it sent to [facility]. Please advise.

## 2019-06-03 NOTE — Progress Notes (Addendum)
[facility] at [facility] [address], [place], [place] [address] [phone]  Date:  06/10/2019   Name:  Deborah [name]   DOB:  27-Jul-1970   MRN:  [id number]  PCP:  Darreld Mclean, MD    Chief Complaint: Diarrhea (colonoscopy worsened)   History of Present Illness:  Deborah [name] is a 48 y.o. 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  Lab Results  Component Value Date   HGBA1C 7.5 (H) 04/01/2019   She has gained a few lbs over the last months due to holidays -she plans to work on getting these pounds back off Wt Readings from Last 3 Encounters:  06/10/19 180 lb (81.6 kg)  04/25/19 177 lb (80.3 kg)  04/12/19 177 lb (80.3 kg)   She notes that after she has her BM, she will have some stool discharge for an hour or so after she finishes her BM.  She thinks this has been going on since she had her hemorrhoid surgery about a decade ago Not having diarrhea-her stool consistency has not really changed She will put TP in her underwear to keep herself clean, will leave it in place for 1 or 2 hours after a bowel movement BM generally occurs at least BID- this is her usual pattern No pain No blood in her stool No change in her symptoms following recent colonoscopy.  Reviewed colonoscopy report, she had 2 small polyps removed  She would like to check an A1c today-last level was about 2 and half months ago, but with current pandemic follow-up is somewhat uncertain  Recent CMP, CBC on chart- all ok   She also notes some irritation of her bilateral nares for the last few weeks.  She just started back on Flonase a couple of days ago.  She is not sure if it is helping it  Patient Active  Problem List   Diagnosis Date Noted  . Uncontrolled type 2 diabetes mellitus without complication, without long-term current use of insulin 10/05/2015  . 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 MUSCULOSKELETAL PAIN 10/01/2009  . DYSLIPIDEMIA 12/05/2008  . FATTY LIVER DISEASE 10/20/2008  . NUMMULAR ECZEMA 05/22/2008  . FEMALE INFERTILITY 05/01/2007  . ANEMIA, IRON DEFICIENCY 04/03/2007  . SMOKER 11/28/2006  . HYPERTENSION, MILD 11/28/2006  . HEMORRHOIDS, INTERNAL W/O COMPLICATION 34/74/2595  . RHINITIS, ALLERGIC, DUE TO POLLEN 11/28/2006  . GERD 11/28/2006  . DERMATITIS, ATOPIC 11/28/2006  . COCAINE ABUSE, HX OF 11/28/2006  . LIVER FUNCTION TESTS, ABNORMAL 09/14/2006  . GOUT 06/25/2004  . 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 Allergy   . Anemia   . Arthritis   . Diabetes mellitus   . Eczema   . GERD (gastroesophageal reflux disease)   . Gout   . Hyperlipidemia    no meds  . Hypertension   . Neuromuscular disorder ([place])   . Polysubstance abuse ([place])    ETOH and Cocaine  . Seasonal allergies     Past Surgical History:  Procedure Laterality Date  . HEMORRHOID SURGERY      Social History   Tobacco Use  . Smoking status: Former Smoker    Packs/day: 0.50  Quit date: 04/24/2013    Years since quitting: 6.1  . Smokeless tobacco: Former Systems developer    Quit date: 03/14/2013  Substance Use Topics  . Alcohol use: No    Comment: none since 03-14-13  . Drug use: Not Currently    Types: Marijuana, Cocaine    Comment: Quit 04/14/2013 per pt.     Family History  Problem Relation Age of Onset  . Hypertension Father   . Diabetes Father   . Cancer Father   . Hypertension Other   . Diabetes Other   . Diabetes Mother   . Hypertension Mother   . Diabetes Maternal Grandmother   . Hypertension Maternal Grandmother   . Diabetes Paternal Grandfather   . Colon cancer Neg Hx   . Colon polyps Neg Hx   . Esophageal cancer Neg Hx   . Rectal  cancer Neg Hx   . Stomach cancer Neg Hx     Allergies  Allergen Reactions  . Cephalexin     REACTION: stomach cramps and hives  . Hydrocodone     headache  . Latex   . Neomycin-Bacitracin Zn-Polymyx     REACTION: burning  . Sertraline Hives and Itching  . Adhesive [Tape] Hives and Rash    Medication list has been reviewed and updated.  Current Outpatient Medications on File Prior to Visit  Medication Sig Dispense Refill  . allopurinol (ZYLOPRIM) 100 MG tablet Take 1 tablet (100 mg total) by mouth daily. 30 tablet 11  . amLODipine (NORVASC) 10 MG tablet Take 1 tablet (10 mg total) by mouth daily. 30 tablet 11  . blood glucose meter kit and supplies Dispense based on patient and insurance preference. Use up to four times daily as directed. (FOR ICD-10 E10.9, E11.9). 1 each 0  . esomeprazole (NEXIUM) 20 MG capsule Take 20 mg by mouth daily at 12 noon.    Marland Kitchen glipiZIDE (GLUCOTROL XL) 2.5 MG 24 hr tablet Take 1 tablet (2.5 mg total) by mouth daily with breakfast. 30 tablet 11  . indomethacin (INDOCIN) 50 MG capsule Take 1 capsule (50 mg total) by mouth 2 (two) times daily. Use as needed for gout flare 60 capsule 3  . lisinopril (ZESTRIL) 10 MG tablet Take 1 tablet (10 mg total) by mouth daily. 30 tablet 11  . loratadine (CLARITIN) 10 MG tablet Take 1 tablet (10 mg total) by mouth daily. 100 tablet 3  . metFORMIN (GLUCOPHAGE) 1000 MG tablet Take 1 tablet (1,000 mg total) by mouth 2 (two) times daily. 60 tablet 11  . SUMAtriptan (IMITREX) 50 MG tablet Take 1 dose in case of headache. May repeat in 2 hours if headache persists or recurs.  Max 200 mg per 24 hours 10 tablet 0  . triamcinolone cream (KENALOG) 0.1 % Apply 1 application topically 2 (two) times daily. Use as needed for eczema 80 g 2   No current facility-administered medications on file prior to visit.    Review of Systems:  As per HPI- otherwise negative. No fever or chills, no chest pain or shortness of breath  Physical  Examination: Vitals:   06/10/19 0829  BP: 126/88  Pulse: 89  Resp: 16  Temp: (!) 96.7 F (35.9 C)  SpO2: 98%   Vitals:   06/10/19 0829  Weight: 180 lb (81.6 kg)  Height: 4' 11"  (1.499 m)   Body mass index is 36.36 kg/m. Ideal Body Weight: Weight in (lb) to have BMI = 25: 123.5  GEN: WDWN, NAD, Non-toxic, A &  O x 3, obese, looks well HEENT: Atraumatic, Normocephalic. Neck supple. No masses, No LAD. Nasal cavity reveals mild irritation Ears and Nose: No external deformity. CV: RRR, No M/G/R. No JVD. No thrill. No extra heart sounds. PULM: CTA B, no wheezes, crackles, rhonchi. No retractions. No resp. distress. No accessory muscle use. ABD: S, NT, ND, +BS. No rebound. No HSM.  Benign exam EXTR: No c/c/e NEURO Normal gait.  PSYCH: Normally interactive. Conversant. Not depressed or anxious appearing.  Calm demeanor.    Assessment and Plan: Change in consistency of stool  Uncontrolled type 2 diabetes mellitus with hyperglycemia (HCC) - Plan: Hemoglobin A1c  Mixed hyperlipidemia  Essential hypertension  Non-seasonal allergic rhinitis, unspecified trigger - Plan: fluticasone (FLONASE) 50 MCG/ACT nasal spray  Here today for a follow-up visit Check A1c-she has gained a few pounds over the holidays, may need medication adjustment Blood pressure under control on current regimen Encouraged her to continue fluticasone nasal spray, try a moisturizer of her nostril such as Aquaphor or Vaseline. We will try a plan of increased fiber intake to see if this will help with her stool symptoms.  I recommended a daily dose of Metamucil or similar, and also increase dietary fiber intake through food.  If she does not notice a difference in about 1 month she will let me know, in that case we will plan to refer her back to GI This visit occurred during the SARS-CoV-2 public health emergency.  Safety protocols were in place, including screening questions prior to the visit, additional usage of  staff PPE, and extensive cleaning of exam room while observing appropriate contact time as indicated for disinfecting solutions.    Signed Lamar Blinks, MD  Received her A1c- message to pt A1c looks fine, recheck 4 months Results for orders placed or performed in visit on 06/10/19  Hemoglobin A1c  Result Value Ref Range   Hgb A1c MFr Bld 7.2 (H) 4.6 - 6.5 %

## 2019-06-05 ENCOUNTER — Other Ambulatory Visit: Payer: Self-pay

## 2019-06-06 ENCOUNTER — Other Ambulatory Visit: Payer: Self-pay

## 2019-06-10 ENCOUNTER — Encounter: Payer: Self-pay | Admitting: Family Medicine

## 2019-06-10 ENCOUNTER — Ambulatory Visit: Payer: Managed Care, Other (non HMO) | Admitting: Family Medicine

## 2019-06-10 ENCOUNTER — Other Ambulatory Visit: Payer: Self-pay

## 2019-06-10 VITALS — BP 126/88 | HR 89 | Temp 96.7°F | Resp 16 | Ht 59.0 in | Wt 180.0 lb

## 2019-06-10 DIAGNOSIS — E1165 Type 2 diabetes mellitus with hyperglycemia: Secondary | ICD-10-CM

## 2019-06-10 DIAGNOSIS — J3089 Other allergic rhinitis: Secondary | ICD-10-CM

## 2019-06-10 DIAGNOSIS — E782 Mixed hyperlipidemia: Secondary | ICD-10-CM | POA: Diagnosis not present

## 2019-06-10 DIAGNOSIS — R195 Other fecal abnormalities: Secondary | ICD-10-CM

## 2019-06-10 DIAGNOSIS — I10 Essential (primary) hypertension: Secondary | ICD-10-CM | POA: Diagnosis not present

## 2019-06-10 LAB — HEMOGLOBIN A1C: Hgb A1c MFr Bld: 7.2 % — ABNORMAL HIGH (ref 4.6–6.5)

## 2019-06-10 MED ORDER — FLUTICASONE PROPIONATE 50 MCG/ACT NA SUSP: 2.0000 | Freq: Every day | NASAL | 12 refills | Status: DC

## 2019-06-10 MED FILL — metFORMIN HCL 1000 MG TABS: 1000 | 30 days supply | Qty: 60 | Fill #2

## 2019-06-10 MED FILL — FLUTICASONE PROP 50 MCG SPR: 50 | 30 days supply | Qty: 16 | Fill #0

## 2019-06-10 MED FILL — glipiZIDE ER 2.5 MG TB24: 2.5 | 30 days supply | Qty: 30 | Fill #2

## 2019-06-10 NOTE — Patient Instructions (Signed)
It was great to see you again today, happy new year I will be in touch with your A1c as soon as possible Please continue using the nasal steroid spray, Flonase, as needed for nasal irritation.  Using some Aquaphor or Vaseline in your nostrils will also help with dryness For your stool issue, I think increasing fiber intake may be helpful. Please add 1 dose of over-the-counter Metamucil or similar fiber supplement daily.  You might also try increasing your dietary fiber, I have included a list of high-fiber foods Please let me know how you are doing in about 1 month, take care   Fiber Content in Foods  See the following list for the dietary fiber content of some common foods. High-fiber foods High-fiber foods contain 4 grams or more (4g or more) of fiber per serving. They include:  Artichoke (fresh) -- 1 medium has 10.3g of fiber.  Baked beans, plain or vegetarian (canned) --  cup has 5.2g of fiber.  Blackberries or raspberries (fresh) --  cup has 4g of fiber.  Bran cereal --  cup has 8.6g of fiber.  Bulgur (cooked) --  cup has 4g of fiber.  Kidney beans (canned) --  cup has 6.8g of fiber.  Lentils (cooked) --  cup has 7.8g of fiber.  Pear (fresh) -- 1 medium has 5.1g of fiber.  Peas (frozen) --  cup has 4.4g of fiber.  Pinto beans (canned) --  cup has 5.5g of fiber.  Pinto beans (dried and cooked) --  cup has 7.7g of fiber.  Potato with skin (baked) -- 1 medium has 4.4g of fiber.  Quinoa (cooked) --  cup has 5g of fiber.  Soybeans (canned, frozen, or fresh) --  cup has 5.1g of fiber. Moderate-fiber foods Moderate-fiber foods contain 1-4 grams (1-4g) of fiber per serving. They include:  Almonds -- 1 oz. has 3.5g of fiber.  Apple with skin -- 1 medium has 3.3g of fiber.  Applesauce, sweetened --  cup has 1.5g of fiber.  Bagel, plain -- one 4-inch (10-cm) bagel has 2g of fiber.  Banana -- 1 medium has 3.1g of fiber.  Broccoli (cooked) --  cup has 2.5g  of fiber.  Carrots (cooked) --  cup has 2.3g of fiber.  Corn (canned or frozen) --  cup has 2.1g of fiber.  Corn tortilla -- one 6-inch (15-cm) tortilla has 1.5g of fiber.  Green beans (canned) --  cup has 2g of fiber.  Instant oatmeal --  cup has about 2g of fiber.  Long-grain brown rice (cooked) -- 1 cup has 3.5g of fiber.  Macaroni, enriched (cooked) -- 1 cup has 2.5g of fiber.  Melon -- 1 cup has 1.4g of fiber.  Multigrain cereal --  cup has about 2-4g of fiber.  Orange -- 1 small has 3.1g of fiber.  Potatoes, mashed --  cup has 1.6g of fiber.  Raisins -- 1/4 cup has 1.6g of fiber.  Squash --  cup has 2.9g of fiber.  Sunflower seeds --  cup has 1.1g of fiber.  Tomato -- 1 medium has 1.5g of fiber.  Vegetable or soy patty -- 1 has 3.4g of fiber.  Whole-wheat bread -- 1 slice has 2g of fiber.  Whole-wheat spaghetti --  cup has 3.2g of fiber. Low-fiber foods Low-fiber foods contain less than 1 gram (less than 1g) of fiber per serving. They include:  Egg -- 1 large.  Flour tortilla -- one 6-inch (15-cm) tortilla.  Fruit juice --  cup.  Lettuce --  1 cup.  Meat, poultry, or fish -- 1 oz.  Milk -- 1 cup.  Spinach (raw) -- 1 cup.  White bread -- 1 slice.  White rice --  cup.  Yogurt --  cup. Actual amounts of fiber in foods may be different depending on processing. Talk with your dietitian about how much fiber you need in your diet. This information is not intended to replace advice given to you by your health care provider. Make sure you discuss any questions you have with your health care provider. Document Revised: 01/14/2016 Document Reviewed: 07/16/2015 Elsevier Patient Education  [place].

## 2019-06-20 ENCOUNTER — Encounter: Payer: Self-pay | Admitting: Family Medicine

## 2019-06-20 ENCOUNTER — Telehealth: Payer: Self-pay | Admitting: Family Medicine

## 2019-06-20 DIAGNOSIS — M79673 Pain in unspecified foot: Secondary | ICD-10-CM

## 2019-06-20 DIAGNOSIS — G8929 Other chronic pain: Secondary | ICD-10-CM

## 2019-06-20 NOTE — Telephone Encounter (Signed)
Referral placed.

## 2019-06-20 NOTE — Telephone Encounter (Signed)
Copied from [place] [phone]. Topic: Referral - Request for Referral >> Jun 20, 2019 12:33 PM Scherrie Gerlach wrote: Pt states her heels, balls, and middle of feet hurt and she would like referral to a podiatrist

## 2019-06-21 ENCOUNTER — Encounter: Payer: Self-pay | Admitting: Family Medicine

## 2019-06-24 MED FILL — ALLOPURINOL 100 MG TABS: 100 | 30 days supply | Qty: 30 | Fill #1

## 2019-06-24 MED FILL — AMLODIPINE BESYLATE 10 MG T: 10 | 30 days supply | Qty: 30 | Fill #1

## 2019-07-02 ENCOUNTER — Encounter: Payer: Self-pay | Admitting: Gastroenterology

## 2019-07-02 ENCOUNTER — Ambulatory Visit (INDEPENDENT_AMBULATORY_CARE_PROVIDER_SITE_OTHER): Payer: Managed Care, Other (non HMO)

## 2019-07-02 ENCOUNTER — Other Ambulatory Visit: Payer: Self-pay

## 2019-07-02 ENCOUNTER — Other Ambulatory Visit: Payer: Self-pay | Admitting: Sports Medicine

## 2019-07-02 ENCOUNTER — Ambulatory Visit: Payer: Managed Care, Other (non HMO) | Admitting: Gastroenterology

## 2019-07-02 ENCOUNTER — Ambulatory Visit: Payer: Managed Care, Other (non HMO) | Admitting: Sports Medicine

## 2019-07-02 ENCOUNTER — Encounter: Payer: Self-pay | Admitting: Sports Medicine

## 2019-07-02 VITALS — BP 132/72 | HR 85 | Temp 97.9°F | Ht 59.0 in | Wt 178.0 lb

## 2019-07-02 VITALS — BP 133/75 | HR 85 | Temp 97.2°F

## 2019-07-02 DIAGNOSIS — M779 Enthesopathy, unspecified: Secondary | ICD-10-CM

## 2019-07-02 DIAGNOSIS — IMO0002 Reserved for concepts with insufficient information to code with codable children: Secondary | ICD-10-CM

## 2019-07-02 DIAGNOSIS — R159 Full incontinence of feces: Secondary | ICD-10-CM | POA: Diagnosis not present

## 2019-07-02 DIAGNOSIS — E1165 Type 2 diabetes mellitus with hyperglycemia: Secondary | ICD-10-CM

## 2019-07-02 DIAGNOSIS — K6289 Other specified diseases of anus and rectum: Secondary | ICD-10-CM

## 2019-07-02 DIAGNOSIS — Z9889 Other specified postprocedural states: Secondary | ICD-10-CM

## 2019-07-02 DIAGNOSIS — M79671 Pain in right foot: Secondary | ICD-10-CM

## 2019-07-02 DIAGNOSIS — M79672 Pain in left foot: Secondary | ICD-10-CM

## 2019-07-02 DIAGNOSIS — E1140 Type 2 diabetes mellitus with diabetic neuropathy, unspecified: Secondary | ICD-10-CM | POA: Diagnosis not present

## 2019-07-02 DIAGNOSIS — M722 Plantar fascial fibromatosis: Secondary | ICD-10-CM

## 2019-07-02 DIAGNOSIS — M2142 Flat foot [pes planus] (acquired), left foot: Secondary | ICD-10-CM | POA: Diagnosis not present

## 2019-07-02 DIAGNOSIS — M2141 Flat foot [pes planus] (acquired), right foot: Secondary | ICD-10-CM

## 2019-07-02 MED ORDER — MELOXICAM 15 MG PO TABS: 15.0000 mg | ORAL_TABLET | Freq: Every day | ORAL | 0 refills | Status: DC

## 2019-07-02 MED ORDER — TRIAMCINOLONE ACETONIDE 10 MG/ML IJ SUSP
10.0000 mg | Freq: Once | INTRAMUSCULAR | Status: AC
  Administered 2019-07-02: 10 mg

## 2019-07-02 MED ORDER — BENEFIBER PO POWD: ORAL | 0 refills | Status: DC

## 2019-07-02 MED FILL — MELOXICAM 15 MG TABLET: 15 | 30 days supply | Qty: 30 | Fill #0

## 2019-07-02 NOTE — Progress Notes (Signed)
Deborah [name]    [id number]    31-May-1971  Primary Care Physician:Copland, Gay Filler, MD  Referring Physician: Darreld Mclean, [address] [place],  [place] [address]   Chief complaint: Fecal incontinence  HPI:  49 year old female here for follow-up visit for fecal incontinence  She continues to have fecal urgency and incontinence multiple times a week.  Denies any rectal bleeding  She is taking Metamucil daily with minimal improvement  She is having progressive worsening of fecal incontinence since hemorrhoidectomy  Colonoscopy 04/25/2019: Pan colonic diverticulosis, rectal scar from prior hemorrhoidal surgery, cecal sessile polyp negative for adenomatous tissue, polypoid inflammatory lesion   Denies any nausea, vomiting, abdominal pain, melena or bright red blood per rectum   Outpatient Encounter Medications as of 07/02/2019  Medication Sig  . allopurinol (ZYLOPRIM) 100 MG tablet Take 1 tablet (100 mg total) by mouth daily.  Marland Kitchen amLODipine (NORVASC) 10 MG tablet Take 1 tablet (10 mg total) by mouth daily.  . blood glucose meter kit and supplies Dispense based on patient and insurance preference. Use up to four times daily as directed. (FOR ICD-10 E10.9, E11.9).  Marland Kitchen esomeprazole (NEXIUM) 20 MG capsule Take 20 mg by mouth daily at 12 noon.  . fluticasone (FLONASE) 50 MCG/ACT nasal spray Place 2 sprays into both nostrils daily.  Marland Kitchen glipiZIDE (GLUCOTROL XL) 2.5 MG 24 hr tablet Take 1 tablet (2.5 mg total) by mouth daily with breakfast.  . indomethacin (INDOCIN) 50 MG capsule Take 1 capsule (50 mg total) by mouth 2 (two) times daily. Use as needed for gout flare  . lisinopril (ZESTRIL) 10 MG tablet Take 1 tablet (10 mg total) by mouth daily.  Marland Kitchen loratadine (CLARITIN) 10 MG tablet Take 1 tablet (10 mg total) by mouth daily.  . meloxicam (MOBIC) 15 MG tablet Take 1 tablet (15 mg total) by mouth daily.  . metFORMIN (GLUCOPHAGE) 1000 MG tablet Take 1  tablet (1,000 mg total) by mouth 2 (two) times daily.  . psyllium (METAMUCIL) 58.6 % powder Take 1 packet by mouth daily.  . SUMAtriptan (IMITREX) 50 MG tablet Take 1 dose in case of headache. May repeat in 2 hours if headache persists or recurs.  Max 200 mg per 24 hours  . triamcinolone cream (KENALOG) 0.1 % Apply 1 application topically 2 (two) times daily. Use as needed for eczema  . [DISCONTINUED] meloxicam (MOBIC) 15 MG tablet Take 1 tablet (15 mg total) by mouth daily.  . [EXPIRED] triamcinolone acetonide (KENALOG) 10 MG/ML injection 10 mg    No facility-administered encounter medications on file as of 07/02/2019.    Allergies as of 07/02/2019 - Review Complete 07/02/2019  Allergen Reaction Noted  . Cephalexin  05/22/2008  . Hydrocodone  12/02/2012  . Latex  05/21/2018  . Neomycin-bacitracin zn-polymyx  05/22/2008  . Sertraline Hives and Itching 02/04/2014  . Adhesive [tape] Hives and Rash 07/08/2011    Past Medical History:  Diagnosis Date  . Allergy   . Anemia   . Arthritis   . Diabetes mellitus   . Eczema   . GERD (gastroesophageal reflux disease)   . Gout   . Hyperlipidemia    no meds  . Hypertension   . Neuromuscular disorder ([place])   . Plantar fasciitis   . Polysubstance abuse ([place])    ETOH and Cocaine  . Seasonal allergies     Past Surgical History:  Procedure Laterality Date  .  HEMORRHOID SURGERY      Family History  Problem Relation Age of Onset  . Hypertension Father   . Diabetes Father   . Cancer Father   . Hypertension Other   . Diabetes Other   . Diabetes Mother   . Hypertension Mother   . Diabetes Maternal Grandmother   . Hypertension Maternal Grandmother   . Diabetes Paternal Grandfather   . Colon cancer Neg Hx   . Colon polyps Neg Hx   . Esophageal cancer Neg Hx   . Rectal cancer Neg Hx   . Stomach cancer Neg Hx     Social History   Socioeconomic History  . Marital status: Single    Spouse name: Not on file  . Number of children:  Not on file  . Years of education: Not on file  . Highest education level: Not on file  Occupational History  . Not on file  Tobacco Use  . Smoking status: Former Smoker    Packs/day: 0.50    Quit date: 04/24/2013    Years since quitting: 6.1  . Smokeless tobacco: Former Systems developer    Quit date: 03/14/2013  Substance and Sexual Activity  . Alcohol use: No    Comment: none since 03-14-13  . Drug use: Not Currently    Types: Marijuana, Cocaine    Comment: Quit 04/14/2013 per pt.   . Sexual activity: Not on file  Other Topics Concern  . Not on file  Social History Narrative  . Not on file   Social Determinants of Health   Financial Resource Strain:   . Difficulty of Paying Living Expenses: Not on file  Food Insecurity:   . Worried About Charity fundraiser in the Last Year: Not on file  . Ran Out of Food in the Last Year: Not on file  Transportation Needs:   . Lack of Transportation (Medical): Not on file  . Lack of Transportation (Non-Medical): Not on file  Physical Activity:   . Days of Exercise per Week: Not on file  . Minutes of Exercise per Session: Not on file  Stress:   . Feeling of Stress : Not on file  Social Connections:   . Frequency of Communication with Friends and Family: Not on file  . Frequency of Social Gatherings with Friends and Family: Not on file  . Attends Religious Services: Not on file  . Active Member of Clubs or Organizations: Not on file  . Attends Archivist Meetings: Not on file  . Marital Status: Not on file  Intimate Partner Violence:   . Fear of Current or Ex-Partner: Not on file  . Emotionally Abused: Not on file  . Physically Abused: Not on file  . Sexually Abused: Not on file      Review of systems:  All other review of systems negative except as mentioned in the HPI.   Physical Exam: Vitals:   07/02/19 1335  BP: 132/72  Pulse: 85  Temp: 97.9 F (36.6 C)   Body mass index is 35.95 kg/m. Gen:      No acute  distress HEENT:  EOMI, sclera anicteric Neck:     No masses; no thyromegaly Lungs:    Clear to auscultation bilaterally; normal respiratory effort CV:         Regular rate and rhythm; no murmurs Abd:      + bowel sounds; soft, non-tender; no palpable masses, no distension Ext:    No edema; adequate peripheral perfusion Skin:  Warm and dry; no rash Neuro: alert and oriented x 3 Psych: normal mood and affect  Data Reviewed:  Reviewed labs, radiology imaging, old records and pertinent past GI work up   Assessment and Plan/Recommendations:  49 year old female with history of obesity, hypertension, GERD, symptomatic hemorrhoids s/p hemorrhoidectomy with complaints of worsening fecal incontinence   Incompetent anal sphincter s/p hemorrhoidectomy : Kegel exercises to strengthen sphincter tone Refer to pelvic floor physical therapy to improve sphincter tone and for biofeedback   Trial of lactose-free diet, as lactose intolerance could be exacerbating her symptoms Stop Metamucil Start Benefiber 1 tablespoon 3 times daily with meals  If continues to have persistent episodes of fecal incontinence, will consider anorectal manometry for further evaluation and refer to colorectal surgery for possible InterStim placement  GERD: Continue PPI and antireflux measures  The patient was provided an opportunity to ask questions and all were answered. The patient agreed with the plan and demonstrated an understanding of the instructions.  Damaris Hippo , MD    CC: Copland, Gay Filler, MD

## 2019-07-02 NOTE — Progress Notes (Signed)
Subjective: Deborah [name] is a 49 y.o. female patient who presents to office for evaluation of Bilateral foot pain. Patient complains of progressive pain especially over the last 2 months in the both heels reports that she works 11 hours 3 times per week and reports that she has gone for pair of orthotics over-the-counter as well as changing her shoes and nothing seems to help reports that walking is standing on hard surfaces and hard floors tends to aggravate her pain.  Denies injury/trip/fall/sprain/any causative factors.   Review of Systems  All other systems reviewed and are negative.    Patient Active Problem List   Diagnosis Date Noted  . Uncontrolled type 2 diabetes mellitus with diabetic neuropathy, without long-term current use of insulin ([place]) 10/05/2015  . 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 MUSCULOSKELETAL PAIN 10/01/2009  . DYSLIPIDEMIA 12/05/2008  . FATTY LIVER DISEASE 10/20/2008  . NUMMULAR ECZEMA 05/22/2008  . FEMALE INFERTILITY 05/01/2007  . ANEMIA, IRON DEFICIENCY 04/03/2007  . SMOKER 11/28/2006  . HYPERTENSION, MILD 11/28/2006  . HEMORRHOIDS, INTERNAL W/O COMPLICATION 41/32/4401  . RHINITIS, ALLERGIC, DUE TO POLLEN 11/28/2006  . GERD 11/28/2006  . DERMATITIS, ATOPIC 11/28/2006  . COCAINE ABUSE, HX OF 11/28/2006  . LIVER FUNCTION TESTS, ABNORMAL 09/14/2006  . GOUT 06/25/2004  . MORTON'S NEUROMA, 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    Current Outpatient Medications on File Prior to Visit  Medication Sig Dispense Refill  . allopurinol (ZYLOPRIM) 100 MG tablet Take 1 tablet (100 mg total) by mouth daily. 30 tablet 11  . amLODipine (NORVASC) 10 MG tablet Take 1 tablet (10 mg total) by mouth daily. 30 tablet 11  . blood glucose meter kit and supplies Dispense based on patient and insurance preference. Use up to four times daily as directed. (FOR ICD-10 E10.9, E11.9). 1 each 0  . esomeprazole (NEXIUM) 20 MG capsule Take 20 mg by mouth daily at 12 noon.    . fluticasone (FLONASE) 50  MCG/ACT nasal spray Place 2 sprays into both nostrils daily. 16 g 12  . glipiZIDE (GLUCOTROL XL) 2.5 MG 24 hr tablet Take 1 tablet (2.5 mg total) by mouth daily with breakfast. 30 tablet 11  . indomethacin (INDOCIN) 50 MG capsule Take 1 capsule (50 mg total) by mouth 2 (two) times daily. Use as needed for gout flare 60 capsule 3  . lisinopril (ZESTRIL) 10 MG tablet Take 1 tablet (10 mg total) by mouth daily. 30 tablet 11  . loratadine (CLARITIN) 10 MG tablet Take 1 tablet (10 mg total) by mouth daily. 100 tablet 3  . metFORMIN (GLUCOPHAGE) 1000 MG tablet Take 1 tablet (1,000 mg total) by mouth 2 (two) times daily. 60 tablet 11  . SUMAtriptan (IMITREX) 50 MG tablet Take 1 dose in case of headache. May repeat in 2 hours if headache persists or recurs.  Max 200 mg per 24 hours 10 tablet 0  . triamcinolone cream (KENALOG) 0.1 % Apply 1 application topically 2 (two) times daily. Use as needed for eczema 80 g 2   No current facility-administered medications on file prior to visit.    Allergies  Allergen Reactions  . Cephalexin     REACTION: stomach cramps and hives  . Hydrocodone     headache  . Latex   . Neomycin-Bacitracin Zn-Polymyx     REACTION: burning  . Sertraline Hives and Itching  . Adhesive [Tape] Hives and Rash    Objective:  General: Alert and oriented x3 in no acute distress  Dermatology: No open lesions bilateral lower  extremities, no webspace macerations, no ecchymosis bilateral, all nails x 10 are well manicured.  Vascular: Dorsalis Pedis and Posterior Tibial pedal pulses palpable, Capillary Fill Time 3 seconds,(+) pedal hair growth bilateral, no edema bilateral lower extremities, Temperature gradient within normal limits.  Neurology: Johney Maine sensation intact via light touch bilateral.  Musculoskeletal: Mild tenderness with palpation at medial calcaneal tubercle and extending into the arch bilateral.  There is midtarsal breech supportive of pes planus.  Limited ankle range  of motion bilateral.  Strength within normal limits in all groups bilateral.   Gait: Antalgic gait  Xrays  Right and left Foot   Impression: Normal osseous mineralization, midtarsal breach supportive of pes planus, posterior heel spur, soft tissue margins within normal limits, no other acute findings.  Assessment and Plan: Problem List Items Addressed This Visit      Endocrine   Uncontrolled type 2 diabetes mellitus with diabetic neuropathy, without long-term current use of insulin ([place])    Other Visit Diagnoses    Pain in both feet    -  Primary   Relevant Medications   triamcinolone acetonide (KENALOG) 10 MG/ML injection 10 mg   Other Relevant Orders   DG Foot Complete Right   DG Foot Complete Left   Pes planus of both feet       Tendonitis       Plantar fasciitis, bilateral       Relevant Medications   triamcinolone acetonide (KENALOG) 10 MG/ML injection 10 mg   meloxicam (MOBIC) 15 MG tablet      -Complete examination performed -Xrays reviewed -Discussed treatment options -Prescribed meloxicam to take as instructed. -After oral consent and aseptic prep, injected a mixture containing 1 ml of 2%  plain lidocaine, 1 ml 0.5% plain marcaine, 0.5 ml of kenalog 10 and 0.5 ml of dexamethasone phosphate into left and right heel at glabrous junction of plantar fascia without complication. Post-injection care discussed with patient.  -Dispensed bilateral plantar fascial braces -Recommend gentle stretching icing and good supportive shoes daily advised patient if pain is better at next visit we can discuss diabetic shoes and custom insoles -Patient to return to office in 1 month or sooner if condition worsens.  Landis Martins, DPM

## 2019-07-02 NOTE — Patient Instructions (Addendum)
If you are age 51 or older, your body mass index should be between 23-30. Your Body mass index is 35.95 kg/m. If this is out of the aforementioned range listed, please consider follow up with your Primary Care Provider.  If you are age 6 or younger, your body mass index should be between 19-25. Your Body mass index is 35.95 kg/m. If this is out of the aformentioned range listed, please consider follow up with your Primary Care Provider.   We are giving you a lactose free diet to follow.  Please STOP Metamucil.  Start Benefiber or similar fiber supplement. Handout given.  We are referring you for Pelvic Floor Physical Therapy. They will contact you to schedule an appointment.   Kegel Exercises  Kegel exercises can help strengthen your pelvic floor muscles. The pelvic floor is a group of muscles that support your rectum, small intestine, and bladder. In females, pelvic floor muscles also help support the womb (uterus). These muscles help you control the flow of urine and stool. Kegel exercises are painless and simple, and they do not require any equipment. Your provider may suggest Kegel exercises to:  Improve bladder and bowel control.  Improve sexual response.  Improve weak pelvic floor muscles after surgery to remove the uterus (hysterectomy) or pregnancy (females).  Improve weak pelvic floor muscles after prostate gland removal or surgery (males). Kegel exercises involve squeezing your pelvic floor muscles, which are the same muscles you squeeze when you try to stop the flow of urine or keep from passing gas. The exercises can be done while sitting, standing, or lying down, but it is best to vary your position. Exercises How to do Kegel exercises: 1. Squeeze your pelvic floor muscles tight. You should feel a tight lift in your rectal area. If you are a female, you should also feel a tightness in your vaginal area. Keep your stomach, buttocks, and legs relaxed. 2. Hold the muscles  tight for up to 10 seconds. 3. Breathe normally. 4. Relax your muscles. 5. Repeat as told by your health care provider. Repeat this exercise daily as told by your health care provider. Continue to do this exercise for at least 4-6 weeks, or for as long as told by your health care provider. You may be referred to a physical therapist who can help you learn more about how to do Kegel exercises. Depending on your condition, your health care provider may recommend:  Varying how long you squeeze your muscles.  Doing several sets of exercises every day.  Doing exercises for several weeks.  Making Kegel exercises a part of your regular exercise routine. This information is not intended to replace advice given to you by your health care provider. Make sure you discuss any questions you have with your health care provider. Document Revised: 01/10/2018 Document Reviewed: 01/10/2018 Elsevier Patient Education  [employer].  I would like to see you back in 4 to 6 weeks.  We have scheduled you for an appointment on 08/05/19 at 1:10pm.    Thank you, Dr. Silverio Decamp

## 2019-07-03 MED FILL — LISINOPRIL 10 MG TABS: 10 | 30 days supply | Qty: 30 | Fill #1

## 2019-07-04 ENCOUNTER — Encounter: Payer: Self-pay | Admitting: Gastroenterology

## 2019-07-05 MED FILL — metFORMIN HCL 1000 MG TABS: 1000 | 30 days supply | Qty: 60 | Fill #3

## 2019-07-05 MED FILL — GLIPIZIDE ER 2.5 MG TB24: 2.5 | 30 days supply | Qty: 30 | Fill #3

## 2019-07-08 ENCOUNTER — Telehealth: Payer: Self-pay | Admitting: Family Medicine

## 2019-07-08 DIAGNOSIS — R519 Headache, unspecified: Secondary | ICD-10-CM

## 2019-07-08 MED ORDER — SUMATRIPTAN SUCCINATE 50 MG PO TABS: ORAL_TABLET | ORAL | 3 refills | Status: DC

## 2019-07-08 MED FILL — SUMATRIPTAN SUCC 50 MG TAB: 50 | 30 days supply | Qty: 9 | Fill #0

## 2019-07-08 NOTE — Telephone Encounter (Signed)
Medication refilled

## 2019-07-08 NOTE — Telephone Encounter (Signed)
Pt came in office stating is needing refill on SUMAtriptan (IMITREX) 50 MG tablet, pt mentioned that provider gave meds to help with headaches, pt wanted provider to know that meds is working and wanting refills on the med. If any questions please call pt at [phone]. Meds to be sent to [facility]. Please advise.

## 2019-07-22 ENCOUNTER — Ambulatory Visit: Payer: Managed Care, Other (non HMO) | Attending: Gastroenterology | Admitting: Physical Therapy

## 2019-07-29 ENCOUNTER — Telehealth: Payer: Self-pay | Admitting: Gastroenterology

## 2019-07-29 MED FILL — AMLODIPINE BESYLATE 10 MG T: 10 | 30 days supply | Qty: 30 | Fill #2

## 2019-07-29 MED FILL — ALLOPURINOL 100 MG TABS: 100 | 30 days supply | Qty: 30 | Fill #2

## 2019-07-29 NOTE — Telephone Encounter (Signed)
Patient calling- she states that she could not get an appointment with physical therapy until 3/16- she is asking if she still needs to come to the appointment she has on 3/1 or wait until after the physical therapy appointment. She also states that she is still having the bowl problems she was having and states that the Benefiber is not working and is making her constipated.

## 2019-07-29 NOTE — Telephone Encounter (Signed)
Patient reports her bowel movements continued to be "pasty" despite the Benefiber. She continues with the same problem as before with fecal incontinence. PT appointment is next week. Want me to reschedule her follow up appointment?

## 2019-07-29 NOTE — Telephone Encounter (Signed)
Moved to 09/17/19 at 8:10 am. Patient says to thank Dr Silverio Decamp.

## 2019-07-29 NOTE — Telephone Encounter (Signed)
Please reschedule the office visit to follow up after completion of PT sessions. Thanks

## 2019-07-30 ENCOUNTER — Encounter: Payer: Self-pay | Admitting: Sports Medicine

## 2019-07-30 ENCOUNTER — Ambulatory Visit: Payer: Managed Care, Other (non HMO) | Admitting: Sports Medicine

## 2019-07-30 ENCOUNTER — Other Ambulatory Visit: Payer: Self-pay

## 2019-07-30 VITALS — Temp 98.0°F

## 2019-07-30 DIAGNOSIS — M2141 Flat foot [pes planus] (acquired), right foot: Secondary | ICD-10-CM

## 2019-07-30 DIAGNOSIS — M2022 Hallux rigidus, left foot: Secondary | ICD-10-CM

## 2019-07-30 DIAGNOSIS — M79672 Pain in left foot: Secondary | ICD-10-CM

## 2019-07-30 DIAGNOSIS — M79671 Pain in right foot: Secondary | ICD-10-CM

## 2019-07-30 DIAGNOSIS — M2142 Flat foot [pes planus] (acquired), left foot: Secondary | ICD-10-CM

## 2019-07-30 DIAGNOSIS — E1165 Type 2 diabetes mellitus with hyperglycemia: Secondary | ICD-10-CM

## 2019-07-30 DIAGNOSIS — E1140 Type 2 diabetes mellitus with diabetic neuropathy, unspecified: Secondary | ICD-10-CM

## 2019-07-30 DIAGNOSIS — M779 Enthesopathy, unspecified: Secondary | ICD-10-CM

## 2019-07-30 DIAGNOSIS — M722 Plantar fascial fibromatosis: Secondary | ICD-10-CM

## 2019-07-30 DIAGNOSIS — IMO0002 Reserved for concepts with insufficient information to code with codable children: Secondary | ICD-10-CM

## 2019-07-30 DIAGNOSIS — M2021 Hallux rigidus, right foot: Secondary | ICD-10-CM

## 2019-07-30 MED ORDER — DICLOFENAC SODIUM 75 MG PO TBEC: 75.0000 mg | DELAYED_RELEASE_TABLET | Freq: Two times a day (BID) | ORAL | 0 refills | Status: DC

## 2019-07-30 MED ORDER — TRIAMCINOLONE ACETONIDE 10 MG/ML IJ SUSP
10.0000 mg | Freq: Once | INTRAMUSCULAR | Status: AC
  Administered 2019-07-30: 19:00:00 10 mg

## 2019-07-30 MED FILL — DICLOFENAC SODIUM 75 MG TAB: 75 | 15 days supply | Qty: 30 | Fill #0

## 2019-07-30 NOTE — Progress Notes (Signed)
Subjective: Deborah [name] is a 49 y.o. female patient who returns to office for f/u evaluation of bilateral foot pain, reports that injection helped for a few weeks and that braces help when she can wear them but now the pain has slowly started to come back with now pain radiating up lateral side of foot and up the leg. Reports that she now also has some swelling on both feet and ankles and reports Mobic did not help and that she wants to possibly see if she can get diabetic shoes.   FBS not recorded today  Last A1c 7.2   Patient Active Problem List   Diagnosis Date Noted  . Uncontrolled type 2 diabetes mellitus with diabetic neuropathy, without long-term current use of insulin ([place]) 10/05/2015  . 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 MUSCULOSKELETAL PAIN 10/01/2009  . DYSLIPIDEMIA 12/05/2008  . FATTY LIVER DISEASE 10/20/2008  . NUMMULAR ECZEMA 05/22/2008  . FEMALE INFERTILITY 05/01/2007  . ANEMIA, IRON DEFICIENCY 04/03/2007  . SMOKER 11/28/2006  . HYPERTENSION, MILD 11/28/2006  . HEMORRHOIDS, INTERNAL W/O COMPLICATION 65/53/7482  . RHINITIS, ALLERGIC, DUE TO POLLEN 11/28/2006  . GERD 11/28/2006  . DERMATITIS, ATOPIC 11/28/2006  . COCAINE ABUSE, HX OF 11/28/2006  . LIVER FUNCTION TESTS, ABNORMAL 09/14/2006  . GOUT 06/25/2004  . MORTON'S NEUROMA, 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    Current Outpatient Medications on File Prior to Visit  Medication Sig Dispense Refill  . allopurinol (ZYLOPRIM) 100 MG tablet Take 1 tablet (100 mg total) by mouth daily. 30 tablet 11  . amLODipine (NORVASC) 10 MG tablet Take 1 tablet (10 mg total) by mouth daily. 30 tablet 11  . blood glucose meter kit and supplies Dispense based on patient and insurance preference. Use up to four times daily as directed. (FOR ICD-10 E10.9, E11.9). 1 each 0  . esomeprazole (NEXIUM) 20 MG capsule Take 20 mg by mouth daily at 12 noon.    . fluticasone (FLONASE) 50 MCG/ACT nasal spray Place 2 sprays into both nostrils daily. 16 g 12  .  glipiZIDE (GLUCOTROL XL) 2.5 MG 24 hr tablet Take 1 tablet (2.5 mg total) by mouth daily with breakfast. 30 tablet 11  . indomethacin (INDOCIN) 50 MG capsule Take 1 capsule (50 mg total) by mouth 2 (two) times daily. Use as needed for gout flare 60 capsule 3  . lisinopril (ZESTRIL) 10 MG tablet Take 1 tablet (10 mg total) by mouth daily. 30 tablet 11  . loratadine (CLARITIN) 10 MG tablet Take 1 tablet (10 mg total) by mouth daily. 100 tablet 3  . meloxicam (MOBIC) 15 MG tablet Take 1 tablet (15 mg total) by mouth daily. 30 tablet 0  . metFORMIN (GLUCOPHAGE) 1000 MG tablet Take 1 tablet (1,000 mg total) by mouth 2 (two) times daily. 60 tablet 11  . SUMAtriptan (IMITREX) 50 MG tablet Take 1 dose in case of headache. May repeat in 2 hours if headache persists or recurs.  Max 200 mg per 24 hours 10 tablet 3  . triamcinolone cream (KENALOG) 0.1 % Apply 1 application topically 2 (two) times daily. Use as needed for eczema 80 g 2  . Wheat Dextrin (BENEFIBER) POWD Use 1 tablespoon three times a day with meals  0   No current facility-administered medications on file prior to visit.    Allergies  Allergen Reactions  . Cephalexin     REACTION: stomach cramps and hives  . Hydrocodone     headache  . Latex   . Neomycin-Bacitracin Zn-Polymyx  REACTION: burning  . Sertraline Hives and Itching  . Adhesive [Tape] Hives and Rash    Objective:  General: Alert and oriented x3 in no acute distress  Dermatology: No open lesions bilateral lower extremities, no webspace macerations, no ecchymosis bilateral, all nails x 10 are well manicured.  Vascular: Dorsalis Pedis and Posterior Tibial pedal pulses palpable, Capillary Fill Time 3 seconds,(+) pedal hair growth bilateral, trace edema bilateral lower extremities, Temperature gradient within normal limits.  Neurology: Johney Maine sensation intact via light touch bilateral.  Musculoskeletal: Mild tenderness with palpation at medial calcaneal tubercle and  extending into the arch bilateral.  Pain at peroneal tendon course that radiates up leg on right, There is midtarsal breech supportive of pes planus.  Limited 1st MTPJ and ankle range of motion bilateral.  Strength within normal limits in all groups bilateral.   Assessment and Plan: Problem List Items Addressed This Visit      Endocrine   Uncontrolled type 2 diabetes mellitus with diabetic neuropathy, without long-term current use of insulin ([place])    Other Visit Diagnoses    Plantar fasciitis, bilateral    -  Primary   Pes planus of both feet       Tendonitis       Pain in both feet       Hallux rigidus of both feet          -Complete examination performed -Discussed treatment options -Prescribed Diclofenac to take as instructed. -After oral consent and aseptic prep, injected a mixture containing 1 ml of 2%  plain lidocaine, 1 ml 0.5% plain marcaine, 0.5 ml of kenalog 10 and 0.5 ml of dexamethasone phosphate into left and right heel at glabrous junction of plantar fascia without complication. THIS IS INJECTION #2 TO THE AREAS. Post-injection care discussed with patient.  -Continue with bilateral plantar fascial braces until she can be seen for diabetic shoes and custom insoles -[employer] diabetic shoe order form was completed; office to contact primary care for approval / certification;  Office to arrange shoe fitting and dispensing. -Recommend gentle stretching icing and Surgitube compression sleeves daily for edema control -Patient to return to office after diabetic shoe measurements or sooner if condition worsens.  Landis Martins, DPM

## 2019-07-31 MED FILL — LISINOPRIL 10 MG TABS: 10 | 30 days supply | Qty: 30 | Fill #2

## 2019-08-05 ENCOUNTER — Ambulatory Visit: Payer: Managed Care, Other (non HMO) | Admitting: Gastroenterology

## 2019-08-09 ENCOUNTER — Other Ambulatory Visit: Payer: Self-pay

## 2019-08-09 ENCOUNTER — Other Ambulatory Visit: Payer: Managed Care, Other (non HMO) | Admitting: Orthotics

## 2019-08-12 MED FILL — GLIPIZIDE ER 2.5 MG TB24: 2.5 | 30 days supply | Qty: 30 | Fill #4

## 2019-08-12 MED FILL — metFORMIN HCL 1000 MG TABS: 1000 | 30 days supply | Qty: 60 | Fill #4

## 2019-08-20 ENCOUNTER — Ambulatory Visit: Payer: Managed Care, Other (non HMO) | Admitting: Physical Therapy

## 2019-08-26 MED FILL — AMLODIPINE BESYLATE 10 MG T: 10 | 30 days supply | Qty: 30 | Fill #3

## 2019-08-26 MED FILL — ALLOPURINOL 100 MG TABS: 100 | 30 days supply | Qty: 30 | Fill #3

## 2019-08-30 MED FILL — HYDROCODON-APAP 5-325: 5-325 | 5 days supply | Qty: 20 | Fill #0

## 2019-08-30 MED FILL — CLINDAMYCIN HCL 300 MG CAP: 300 | 7 days supply | Qty: 28 | Fill #0

## 2019-08-30 MED FILL — LISINOPRIL 10 MG TABS: 10 | 30 days supply | Qty: 30 | Fill #3

## 2019-08-30 MED FILL — CHLORHEXIDINE 0.12% RINSE: 0.12 | 16 days supply | Qty: 473 | Fill #0

## 2019-09-02 ENCOUNTER — Ambulatory Visit: Payer: Managed Care, Other (non HMO) | Attending: Gastroenterology | Admitting: Physical Therapy

## 2019-09-02 ENCOUNTER — Other Ambulatory Visit: Payer: Self-pay

## 2019-09-02 ENCOUNTER — Encounter: Payer: Self-pay | Admitting: Physical Therapy

## 2019-09-02 DIAGNOSIS — M6281 Muscle weakness (generalized): Secondary | ICD-10-CM | POA: Diagnosis not present

## 2019-09-02 DIAGNOSIS — R159 Full incontinence of feces: Secondary | ICD-10-CM | POA: Diagnosis present

## 2019-09-02 DIAGNOSIS — R278 Other lack of coordination: Secondary | ICD-10-CM | POA: Insufficient documentation

## 2019-09-02 NOTE — Patient Instructions (Signed)
Access Code: [email or web address] URL: [email or web address] Date: 09/02/2019 Prepared by: Earlie Counts  Exercises Hooklying Single Knee to Chest Stretch - 2 x daily - 7 x weekly - 1 sets - 2 reps - 30 sec hold Supine Piriformis Stretch Pulling Heel to Hip - 2 x daily - 7 x weekly - 1 sets - 2 reps - 30 sec hold Supine Hamstring Stretch - 1 x daily - 7 x weekly - 1 sets - 1 reps - 30 sec hold  [facility] [address] [place], [place] [address] Phone # [phone] Fax [phone]

## 2019-09-02 NOTE — Therapy (Signed)
[facility] [address] [place], [place], [address] Phone: [phone]   Fax:  [phone]  Physical Therapy Evaluation  Patient Details  Name: Deborah [name] MRN: [id number] Date of Birth: [birth year] Referring Provider (PT): Dr. Harl Bowie   Encounter Date: 09/02/2019  PT End of Session - 09/02/19 0915    Visit Number  1    Date for PT Re-Evaluation  11/25/19    Authorization Type  [employer]    PT Start Time  0845    PT Stop Time  0923    PT Time Calculation (min)  38 min    Activity Tolerance  Patient tolerated treatment well;No increased pain    Behavior During Therapy  WFL for tasks assessed/performed       Past Medical History:  Diagnosis Date  . Allergy   . Anemia   . Arthritis   . Diabetes mellitus   . Eczema   . GERD (gastroesophageal reflux disease)   . Gout   . Hyperlipidemia    no meds  . Hypertension   . Neuromuscular disorder ([place])   . Plantar fasciitis   . Polysubstance abuse ([place])    ETOH and Cocaine  . Seasonal allergies     Past Surgical History:  Procedure Laterality Date  . HEMORRHOID SURGERY      There were no vitals filed for this visit.   Subjective Assessment - 09/02/19 0850    Subjective  When patient has a bowel movement it will come out for 2 hours. Patient has to wipe 5-6 times. Patient needs to put a piece of tissue in the rectum due to stool comes out afterwards. Patient noticed after her hemorroid surgery. Patient tried metamucil  and benefiber made her constipated.    Patient Stated Goals  reduce fecal incontience    Currently in Pain?  No/denies    Multiple Pain Sites  No         [facility] PT Assessment - 09/02/19 0001      Assessment   Medical Diagnosis  R15.9 Full incontinence of feces    Referring Provider (PT)  Dr. Harl Bowie    Onset Date/Surgical Date  --   chronic   Prior Therapy  none      Precautions   Precautions  None       Restrictions   Weight Bearing Restrictions  No      Balance Screen   Has the patient fallen in the past 6 months  No    Has the patient had a decrease in activity level because of a fear of falling?   No    Is the patient reluctant to leave their home because of a fear of falling?   No      Home Film/video editor residence      Prior Function   Level of Independence  Independent    Vocation  Full time employment   works 12 hours   Vocation Requirements  at work has to move around [employer]  does not exercise      Cognition   Overall Cognitive Status  Within Functional Limits for tasks assessed      Posture/Postural Control   Posture/Postural Control  No significant limitations      ROM / Strength   AROM / PROM / Strength  AROM;PROM;Strength      AROM   Lumbar Flexion  decreased by 25%  Lumbar Extension  decreased by 25%      PROM   Right Hip Flexion  100    Left Hip Flexion  100      Strength   Right Hip External Rotation   4/5    Right Hip Internal Rotation  4/5    Right Hip ABduction  4+/5    Right Hip ADduction  3+/5    Left Hip External Rotation  4/5    Left Hip Internal Rotation  4/5    Left Hip ABduction  4+/5    Left Hip ADduction  3+/5                Objective measurements completed on examination: See above findings.    Pelvic Floor Special Questions - 09/02/19 0001    Prior Pregnancies  No    Urinary Leakage  Yes    Activities that cause leaking  Coughing    Fecal incontinence  Yes    Skin Integrity  Intact    Pelvic Floor Internal Exam  Patient confirms identification and approves PT to assess pelvic floor and treatment    Exam Type  Rectal    Palpation  decreased contraction for external sphincter and is 1/5, internal anal sphincter and puborectalis is 2/5; tightness on the right pelvic floor, patient will bulge the pelvic floor with contraction and hold her breath    Strength  weak squeeze, no lift                PT Education - 09/02/19 0922    Education Details  Access Code: [email or web address]    Person(s) Educated  Patient    Methods  Explanation;Demonstration;Verbal cues;Handout    Comprehension  Verbalized understanding;Returned demonstration       PT Short Term Goals - 09/02/19 0955      PT SHORT TERM GOAL #1   Title  independent with initial HEP    Time  4    Period  Weeks    Status  New    Target Date  09/30/19      PT SHORT TERM GOAL #2   Title  understand correct toileting technique to relax the pelvic floor and improve coordination    Time  4    Period  Weeks    Status  New    Target Date  09/30/19      PT SHORT TERM GOAL #3   Title  understand how to perform abdominal massage to improve toileting and reduce abdomimal tightness    Time  4    Period  Weeks    Status  New    Target Date  09/30/19        PT Long Term Goals - 09/02/19 0956      PT LONG TERM GOAL #1   Title  independent with advanced HEP    Time  12    Period  Weeks    Status  New    Target Date  11/25/19      PT LONG TERM GOAL #2   Title  fecal leakage after she has a bowel movement decreased >/= 75% due to increased pelvic floor strength 3/5    Time  12    Period  Weeks    Status  New    Target Date  11/25/19      PT LONG TERM GOAL #3   Title  wipes 1-2 times after a bowel movment due to  bieng able to hold a pelvic floor  contraction for 30 seconds    Time  12    Period  Weeks    Status  New    Target Date  11/25/19      PT LONG TERM GOAL #4   Title  bilateral hip flexion >/= 110 degrees so she is able to sit on the commode with her knees above her hips to fully empty her stool    Time  12    Period  Weeks    Status  New    Target Date  11/25/19             Plan - 09/02/19 0916    Clinical Impression Statement  Patient is a 49 year old female with fecal incontinence for several years. Patient has had a hemorroidectomy in the past. Patient reports no pain. Patient has  a bowel movement then will have incontinence for 2 hours and has to wear tissue in the rectal area. Patient does not feel like she has a complete bowel movement all the time. Pelvic floor strength is 2/5 for the puborectalis and internal anal sphincter and 1/5 for the external anal sphincter. When patient contracts she will hold her breath and bear down. Patient has decreased abdominal and hip strength. Patient bilateral P/ROM for hip flexion is 100 degrees. Patient will benefit from skilled therapy to improve pelvic floor strength and coordination to reduce fecal incontinence.    Personal Factors and Comorbidities  Comorbidity 2;Fitness;Age    Comorbidities  Diabetes, Hemorroidectomy    Examination-Activity Limitations  Toileting;Continence    Examination-Participation Restrictions  Community Activity    Stability/Clinical Decision Making  Evolving/Moderate complexity    Clinical Decision Making  Low    Rehab Potential  Good    PT Frequency  2x / week    PT Duration  12 weeks    PT Treatment/Interventions  Biofeedback;Electrical Stimulation;Neuromuscular re-education;Therapeutic exercise;Therapeutic activities;Patient/family education;Manual techniques;Dry needling    PT Next Visit Plan  work on diaphragm and breathing, abdominal massage, toileting, internal work on the pelvic floor, pelvic floor EMG for coordination    PT Home Exercise Plan  Access Code: [email or web address]    Consulted and Agree with Plan of Care  Patient       Patient will benefit from skilled therapeutic intervention in order to improve the following deficits and impairments:  Decreased coordination, Decreased range of motion, Increased fascial restricitons, Decreased strength  Visit Diagnosis: Muscle weakness (generalized) - Plan: PT plan of care cert/re-cert  Other lack of coordination - Plan: PT plan of care cert/re-cert  Incontinence of feces, unspecified fecal incontinence type - Plan: PT plan of care  cert/re-cert     Problem List Patient Active Problem List   Diagnosis Date Noted  . Uncontrolled type 2 diabetes mellitus with diabetic neuropathy, without long-term current use of insulin ([place]) 10/05/2015  . 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 MUSCULOSKELETAL PAIN 10/01/2009  . DYSLIPIDEMIA 12/05/2008  . FATTY LIVER DISEASE 10/20/2008  . NUMMULAR ECZEMA 05/22/2008  . FEMALE INFERTILITY 05/01/2007  . ANEMIA, IRON DEFICIENCY 04/03/2007  . SMOKER 11/28/2006  . HYPERTENSION, MILD 11/28/2006  . HEMORRHOIDS, INTERNAL W/O COMPLICATION [id number]  . RHINITIS, ALLERGIC, DUE TO POLLEN 11/28/2006  . GERD 11/28/2006  . DERMATITIS, ATOPIC 11/28/2006  . COCAINE ABUSE, HX OF 11/28/2006  . LIVER FUNCTION TESTS, ABNORMAL 09/14/2006  . GOUT 06/25/2004  . [place], 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    Earlie Counts, PT 09/02/19 10:10 AM   [facility] [address],  [place], [place], [address] Phone: [phone]   Fax:  [phone]  Name: LINETT SALIZAR MRN: [id number] Date of Birth: [birth year]

## 2019-09-16 MED FILL — METFORMIN HCL 1000 MG TABS: 1000 | 30 days supply | Qty: 60 | Fill #5

## 2019-09-16 MED FILL — GLIPIZIDE ER 2.5 MG TB24: 2.5 | 30 days supply | Qty: 30 | Fill #5

## 2019-09-17 ENCOUNTER — Encounter: Payer: Self-pay | Admitting: Gastroenterology

## 2019-09-17 ENCOUNTER — Other Ambulatory Visit: Payer: Self-pay

## 2019-09-17 ENCOUNTER — Ambulatory Visit (INDEPENDENT_AMBULATORY_CARE_PROVIDER_SITE_OTHER): Payer: Managed Care, Other (non HMO) | Admitting: Gastroenterology

## 2019-09-17 VITALS — BP 138/82 | HR 91 | Temp 97.0°F | Resp 97 | Ht 59.0 in | Wt 182.4 lb

## 2019-09-17 DIAGNOSIS — K5902 Outlet dysfunction constipation: Secondary | ICD-10-CM | POA: Diagnosis not present

## 2019-09-17 DIAGNOSIS — R159 Full incontinence of feces: Secondary | ICD-10-CM | POA: Diagnosis not present

## 2019-09-17 DIAGNOSIS — K219 Gastro-esophageal reflux disease without esophagitis: Secondary | ICD-10-CM

## 2019-09-17 NOTE — Progress Notes (Signed)
Deborah [name]    [id number]    1970/09/20  Primary Care Physician:Copland, Gay Filler, MD  Referring Physician: Darreld Mclean, [address] [place],  [place] [address]   Chief complaint: Fecal incontinence HPI:  She started pelvic floor physical therapy, is noticing some improvement.  No longer having Medicaid after bowel movement every time, she is having episodes on average twice a week.  She is also able to evacuate completely.  She was taking Metamucil, but caused worsening constipation, switch to Benefiber with no improvement and hence stopped it  Denies any nausea, vomiting, abdominal pain, melena or bright red blood per rectum   Colonoscopy 04/25/2019: Pan colonic diverticulosis, rectal scar from prior hemorrhoidal surgery, cecal sessile polyp negative for adenomatous tissue, polypoid inflammatory lesion  Outpatient Encounter Medications as of 09/17/2019  Medication Sig  . allopurinol (ZYLOPRIM) 100 MG tablet Take 1 tablet (100 mg total) by mouth daily.  Marland Kitchen amLODipine (NORVASC) 10 MG tablet Take 1 tablet (10 mg total) by mouth daily.  . blood glucose meter kit and supplies Dispense based on patient and insurance preference. Use up to four times daily as directed. (FOR ICD-10 E10.9, E11.9).  Marland Kitchen diclofenac (VOLTAREN) 75 MG EC tablet Take 1 tablet (75 mg total) by mouth 2 (two) times daily.  Marland Kitchen esomeprazole (NEXIUM) 20 MG capsule Take 20 mg by mouth daily at 12 noon.  . fluticasone (FLONASE) 50 MCG/ACT nasal spray Place 2 sprays into both nostrils daily.  Marland Kitchen glipiZIDE (GLUCOTROL XL) 2.5 MG 24 hr tablet Take 1 tablet (2.5 mg total) by mouth daily with breakfast.  . HYDROcodone-acetaminophen (NORCO) 7.5-325 MG tablet Take 1 tablet by mouth every 6 (six) hours as needed for moderate pain.  . indomethacin (INDOCIN) 50 MG capsule Take 1 capsule (50 mg total) by mouth 2 (two) times daily. Use as needed for gout flare  . lisinopril (ZESTRIL) 10 MG  tablet Take 1 tablet (10 mg total) by mouth daily.  Marland Kitchen loratadine (CLARITIN) 10 MG tablet Take 1 tablet (10 mg total) by mouth daily.  . magic mouthwash SOLN Take 5 mLs by mouth.  . meloxicam (MOBIC) 15 MG tablet Take 1 tablet (15 mg total) by mouth daily.  . metFORMIN (GLUCOPHAGE) 1000 MG tablet Take 1 tablet (1,000 mg total) by mouth 2 (two) times daily.  . SUMAtriptan (IMITREX) 50 MG tablet Take 1 dose in case of headache. May repeat in 2 hours if headache persists or recurs.  Max 200 mg per 24 hours  . triamcinolone cream (KENALOG) 0.1 % Apply 1 application topically 2 (two) times daily. Use as needed for eczema  . Wheat Dextrin (BENEFIBER) POWD Use 1 tablespoon three times a day with meals   No facility-administered encounter medications on file as of 09/17/2019.    Allergies as of 09/17/2019 - Review Complete 09/02/2019  Allergen Reaction Noted  . Cephalexin  05/22/2008  . Hydrocodone  12/02/2012  . Latex  05/21/2018  . Neomycin-bacitracin zn-polymyx  05/22/2008  . Sertraline Hives and Itching 02/04/2014  . Adhesive [tape] Hives and Rash 07/08/2011    Past Medical History:  Diagnosis Date  . Allergy   . Anemia   . Arthritis   . Diabetes mellitus   . Eczema   . GERD (gastroesophageal reflux disease)   . Gout   . Hyperlipidemia    no meds  . Hypertension   . Neuromuscular disorder ([place])   .  Plantar fasciitis   . Polysubstance abuse ([place])    ETOH and Cocaine  . Seasonal allergies     Past Surgical History:  Procedure Laterality Date  . HEMORRHOID SURGERY      Family History  Problem Relation Age of Onset  . Hypertension Father   . Diabetes Father   . Cancer Father   . Hypertension Other   . Diabetes Other   . Diabetes Mother   . Hypertension Mother   . Diabetes Maternal Grandmother   . Hypertension Maternal Grandmother   . Diabetes Paternal Grandfather   . Colon cancer Neg Hx   . Colon polyps Neg Hx   . Esophageal cancer Neg Hx   . Rectal cancer Neg Hx     . Stomach cancer Neg Hx     Social History   Socioeconomic History  . Marital status: Single    Spouse name: Not on file  . Number of children: Not on file  . Years of education: Not on file  . Highest education level: Not on file  Occupational History  . Not on file  Tobacco Use  . Smoking status: Former Smoker    Packs/day: 0.50    Quit date: 04/24/2013    Years since quitting: 6.4  . Smokeless tobacco: Former Systems developer    Quit date: 03/14/2013  Substance and Sexual Activity  . Alcohol use: No    Comment: none since 03-14-13  . Drug use: Not Currently    Types: Marijuana, Cocaine    Comment: Quit 04/14/2013 per pt.   . Sexual activity: Not on file  Other Topics Concern  . Not on file  Social History Narrative  . Not on file   Social Determinants of Health   Financial Resource Strain:   . Difficulty of Paying Living Expenses:   Food Insecurity:   . Worried About Charity fundraiser in the Last Year:   . Arboriculturist in the Last Year:   Transportation Needs:   . Film/video editor (Medical):   Marland Kitchen Lack of Transportation (Non-Medical):   Physical Activity:   . Days of Exercise per Week:   . Minutes of Exercise per Session:   Stress:   . Feeling of Stress :   Social Connections:   . Frequency of Communication with Friends and Family:   . Frequency of Social Gatherings with Friends and Family:   . Attends Religious Services:   . Active Member of Clubs or Organizations:   . Attends Archivist Meetings:   Marland Kitchen Marital Status:   Intimate Partner Violence:   . Fear of Current or Ex-Partner:   . Emotionally Abused:   Marland Kitchen Physically Abused:   . Sexually Abused:       Review of systems:  All other review of systems negative except as mentioned in the HPI.   Physical Exam: Vitals:   09/17/19 0818  BP: 138/82  Pulse: 91  Resp: (!) 97  Temp: (!) 97 F (36.1 C)   Body mass index is 36.84 kg/m. 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  GERD: Continue Nexium and antireflux measures  Return in 6 months or sooner if needed  This visit required 30 minutes of patient care (this includes precharting, chart review, review of results, face-to-face time used for counseling as well as treatment plan and follow-up. The patient was provided an opportunity to ask questions and all were answered. The patient agreed with the plan and demonstrated an understanding of the instructions.  Damaris Hippo , MD    CC: Copland, Gay Filler, MD

## 2019-09-17 NOTE — Patient Instructions (Addendum)
Follow Lactose free diet.  Continue physical therapy.   Benefiber -take 1-2 teaspoons daily with meals.   We will see you back in 6 months.   If you are age 51 or older, your body mass index should be between 23-30. Your Body mass index is 36.84 kg/m. If this is out of the aforementioned range listed, please consider follow up with your Primary Care Provider.  If you are age 59 or younger, your body mass index should be between 19-25. Your Body mass index is 36.84 kg/m. If this is out of the aformentioned range listed, please consider follow up with your Primary Care Provider.    Thank you for choosing me and [facility].  Dr. Silverio Decamp

## 2019-09-23 ENCOUNTER — Ambulatory Visit: Payer: Managed Care, Other (non HMO) | Admitting: Orthotics

## 2019-09-24 ENCOUNTER — Ambulatory Visit: Payer: Managed Care, Other (non HMO) | Admitting: Physical Therapy

## 2019-09-30 ENCOUNTER — Encounter: Payer: Self-pay | Admitting: Physical Therapy

## 2019-09-30 ENCOUNTER — Ambulatory Visit: Payer: Managed Care, Other (non HMO) | Attending: Gastroenterology | Admitting: Physical Therapy

## 2019-09-30 ENCOUNTER — Other Ambulatory Visit: Payer: Self-pay | Admitting: Family Medicine

## 2019-09-30 ENCOUNTER — Other Ambulatory Visit: Payer: Self-pay

## 2019-09-30 DIAGNOSIS — R278 Other lack of coordination: Secondary | ICD-10-CM

## 2019-09-30 DIAGNOSIS — M6281 Muscle weakness (generalized): Secondary | ICD-10-CM | POA: Insufficient documentation

## 2019-09-30 DIAGNOSIS — J3089 Other allergic rhinitis: Secondary | ICD-10-CM

## 2019-09-30 DIAGNOSIS — R159 Full incontinence of feces: Secondary | ICD-10-CM | POA: Diagnosis present

## 2019-09-30 MED FILL — LISINOPRIL 10 MG TABS: 10 | 30 days supply | Qty: 30 | Fill #4

## 2019-09-30 MED FILL — ALLOPURINOL 100 MG TABS: 100 | 30 days supply | Qty: 30 | Fill #4

## 2019-09-30 MED FILL — AMLODIPINE BESYLATE 10 MG T: 10 | 30 days supply | Qty: 30 | Fill #4

## 2019-09-30 NOTE — Patient Instructions (Addendum)
About Abdominal Massage  Abdominal massage, also called external colon massage, is a self-treatment circular massage technique that can reduce and eliminate gas and ease constipation. The colon naturally contracts in waves in a clockwise direction starting from inside the right hip, moving up toward the ribs, across the belly, and down inside the left hip.  When you perform circular abdominal massage, you help stimulate your colon's normal wave pattern of movement called peristalsis.  It is most beneficial when done after eating.  Positioning You can practice abdominal massage with oil while lying down, or in the shower with soap.  Some people find that it is just as effective to do the massage through clothing while sitting or standing.  How to Massage Start by placing your finger tips or knuckles on your right side, just inside your hip bone.  . Make small circular movements while you move upward toward your rib cage.   . Once you reach the bottom right side of your rib cage, take your circular movements across to the left side of the bottom of your rib cage.  . Next, move downward until you reach the inside of your left hip bone.  This is the path your feces travel in your colon. . Continue to perform your abdominal massage in this pattern for 10 minutes each day.     You can apply as much pressure as is comfortable in your massage.  Start gently and build pressure as you continue to practice.  Notice any areas of pain as you massage; areas of slight pain may be relieved as you massage, but if you have areas of significant or intense pain, consult with your healthcare provider.  Other Considerations . General physical activity including bending and stretching can have a beneficial massage-like effect on the colon.  Deep breathing can also stimulate the colon because breathing deeply activates the same nervous system that supplies the colon.   . Abdominal massage should always be used in  combination with a bowel-conscious diet that is high in the proper type of fiber for you, fluids (primarily water), and a regular exercise program.   Introduction to [place] and daily habits can help you predict when your bowels will move on a regular basis.  The consistency and quantity of the stool is usually more important than the frequency.  The goal is to have a regular bowel movement that is soft but formed.   Tips on Emptying Regularly . Eat breakfast.  Usually the best time of day for a bowel movement will be a half hour to an hour after eating.  These times are best because the body uses the gastrocolic reflex, a stimulation of bowel motion that occurs with eating, to help produce a bowel movement.  For some people even a simple hot drink in the morning can help the reflex action begin. . Eat all your meals at a predictable time each day.  The bowel functions best when food is introduced at the same regular intervals. . The amount of food eaten at a given time of day should be about the same size from day to day.  The bowel functions best when food is introduced in similar quantities from day to day. It is fine to have a small breakfast and a large lunch, or vice versa, just be consistent. . Eat two servings of fruit or vegetables and at least one serving of a complex carbohydrates (whole grains such as brown rice, bran, whole wheat bread,  or oatmeal) at each meal. . Drink plenty of water--ideally eight glasses a day.  Be sure to increase your water intake if you are increasing fiber into your diet.  Maintain Healthy Habits . Exercise daily.  You may exercise at any time of day, but you may find that bowel function is helped most if the exercise is at a consistent time each day. . Make sure that you are not rushed and have convenient access to a bathroom at your selected time to empty your bowels. .   Introduction to Fiber Fiber Overview Dietary fiber is the part of plants  that can't be digested. There are 2 kinds of dietary fiber: insoluble and soluble.  Insoluble fiber adds bulk to keep foods moving through the digestive system. Soluble fiber holds water, which softens the stool for easy bowel movements. Fiber is an important part of your diet, even though it passes through your body undigested and has no nutritional value. A high fiber diet can:  . promote regular bowel movements  . treat diverticular disease (inflammation of part of the intestine) and irritable bowel syndrome (abdominal pain, diarrhea, and constipation that come and go) . promote improvement in hemorrhoids, constipation and fecal incontinence  You should have at least 14 grams of fiber for every 1000 calories you eat every day. Read the label on every food package to find out how much fiber a serving of the food will provide. Foods containing more than 20% of the daily value of fiber per serving are considered high in fiber.  Without enough fiber in your diet, you may suffer from:  . constipation  . small, hard, dry bowel movements.   Sources of Fiber Breads, cereals, and pasta made with whole grain flour, and brown rice are high fiber foods. Many breakfast cereals list the bran or fiber content, so it's easy to know which products are high in fiber.  All fruits and vegetables also contain fiber. Dried beans, leafy vegetables, peas, raisins, prunes, apples, and citrus fruits are all especially good sources of fiber. Ask for examples of high-fiber foods (the fiber table and types of fiber handouts) for more resources on fiber.  Additional information on fiber content in foods, is available at www.caloriecounts.com    Toileting Techniques for Bowel Movements    An Evacuation/Defecation Plan   Here are the 4 basic points:  1. Lean forward enough for your elbows to rest on your knees 2. Support your feet on the floor or use a low stool if your feet don't touch the floor  3. Push out your belly  as if you have swallowed a beach ball--you should feel a widening of your waist. "Belly Big, Belly Hard" 4. Open and relax your pelvic floor muscles, rather than tightening around the anus  While you are sitting on the toilet pay attention to the following areas: . Jaw and mouth position- relaxed not clenched . Angle of your hips - leaning slightly forward . Whether your feet touch the ground or not - should be flat and supported . Arm placement - rest against your thighs . Spine position - flat back . Waist . Breathing - exhale as you push (like blowing up a balloon or try using other sounds such as ahhhh, shhhhh, ohhhh or grrrrrrr) . Belly - hard and tight as you push . Anus (opening of the anal canal) - relaxed and open as you push . Anus - Tighten and lift pulling the muscle back in after you  are done or if taking a break  If you are not successful after 10-15 minutes, try again later.  Avoid negative self-talk about your toileting experience.   Read this for more details and ask your PT if you need suggestions for adjustments or limitations:  1) Sitting on the toilet  a) Make sure your feet are supported - flat on the floor or step stool b) Many people find it effective to lean forward or raise their knees.  Propping your feet on a step stool (squatty potty is a brand name) can help the muscles around the anus to relax  c) When you lean forward, place your forearms on your thighs for support  2) Relaxing a) Breathe deeply and slowly in through your nose and out through your mouth. b) To become aware of how to relax your muscles, contracting and releasing muscles can be helpful.  Pull your pelvic floor muscles in tightly by using the image of holding back gas, or closing around the anus (visualize making a circle smaller) and lifting the anus up and in.  Then release the muscles and your anus should drop down and feel open. Repeat 5 times ending with the feeling of relaxation. c) Keep  your pelvic floor muscles relaxed; let your belly bulge out. d) The digestive tract starts at the mouth and ends at the anal opening, so be sure to relax both ends of the tube.  Place your tongue on the roof of your mouth with your teeth separated.  This helps relax your mouth and will help to relax the anus at the same time.  3) Emptying (defecation) a) Keep your pelvic floor and sphincter relaxed, then bulge your anal muscles.  Make the anal opening wide.  b) Stick your belly out as if you have swallowed a beach ball. c) Make your belly wall hard using your belly muscles while continuing to breathe. Doing this makes it easier to open your anus. d) Breath out and give a grunt (or try using other sounds such as ahhhh, shhhhh, ohhhh or grrrrrrr). e)  Can also try to act as if you are blowing up a balloon as you push  4) Finishing a) As you finish your bowel movement, pull the pelvic floor muscles up and in.  This will leave your anus in the proper place rather than remaining pushed out and down. If you leave your anus pushed out and down, it will start to feel as though that is normal and give you incorrect signals about needing to have a bowel movement.  Add breathing exercise with hands on the outside of the rib cage and opening up the rib cage . Do 5 times 2 times per day.    [facility] [address] [place], [place] [address] Phone # [phone] Fax [phone]

## 2019-09-30 NOTE — Therapy (Addendum)
[facility] [address] [place], [place], [address] Phone: [phone]   Fax:  [phone]  Physical Therapy Treatment  Patient Details  Name: Deborah [name] MRN: [id number] Date of Birth: [birth year] Referring Provider (PT): Dr. Harl Bowie   Encounter Date: 09/30/2019  PT End of Session - 09/30/19 1614    Visit Number  2    Date for PT Re-Evaluation  11/25/19    Authorization Type  [employer]    PT Start Time  1543    PT Stop Time  1613    PT Time Calculation (min)  30 min    Activity Tolerance  Patient tolerated treatment well;No increased pain    Behavior During Therapy  WFL for tasks assessed/performed       Past Medical History:  Diagnosis Date  . Allergy   . Anemia   . Arthritis   . Diabetes mellitus   . Eczema   . GERD (gastroesophageal reflux disease)   . Gout   . Hyperlipidemia    no meds  . Hypertension   . Neuromuscular disorder ([place])   . Plantar fasciitis   . Polysubstance abuse ([place])    ETOH and Cocaine  . Seasonal allergies     Past Surgical History:  Procedure Laterality Date  . HEMORRHOID SURGERY      There were no vitals filed for this visit.  Subjective Assessment - 09/30/19 1544    Subjective  No changes since the last visit.    Patient Stated Goals  reduce fecal incontience    Currently in Pain?  No/denies    Multiple Pain Sites  No                       [facility] Adult PT Treatment/Exercise - 09/30/19 0001      Self-Care   Self-Care  Other Self-Care Comments    Other Self-Care Comments   discussed with patient with high fiber diet and adding vagetables and fruits and bowel health      Therapeutic Activites    Therapeutic Activities  Other Therapeutic Activities    Other Therapeutic Activities  education on correct toileting and how to relax the pelvic floor to fully have a bowel movement      Manual Therapy   Manual Therapy  Soft tissue mobilization     Soft tissue mobilization  circular massage to promote peristalic motion of the intestines             PT Education - 09/30/19 1613    Education Details  bowel health. correct toileting technique, fiber in diet, and abdominal massage    Person(s) Educated  Patient    Methods  Explanation;Demonstration;Verbal cues;Handout    Comprehension  Verbalized understanding;Returned demonstration       PT Short Term Goals - 09/02/19 0955      PT SHORT TERM GOAL #1   Title  independent with initial HEP    Time  4    Period  Weeks    Status  New    [place] Date  09/30/19      PT SHORT TERM GOAL #2   Title  understand correct toileting technique to relax the pelvic floor and improve coordination    Time  4    Period  Weeks    Status  New    [place] Date  09/30/19      PT SHORT TERM GOAL #3   Title  understand how  to perform abdominal massage to improve toileting and reduce abdomimal tightness    Time  4    Period  Weeks    Status  New    [place] Date  09/30/19        PT Long Term Goals - 09/02/19 0956      PT LONG TERM GOAL #1   Title  independent with advanced HEP    Time  12    Period  Weeks    Status  New    [place] Date  11/25/19      PT LONG TERM GOAL #2   Title  fecal leakage after she has a bowel movement decreased >/= 75% due to increased pelvic floor strength 3/5    Time  12    Period  Weeks    Status  New    [place] Date  11/25/19      PT LONG TERM GOAL #3   Title  wipes 1-2 times after a bowel movment due to  bieng able to hold a pelvic floor contraction for 30 seconds    Time  12    Period  Weeks    Status  New    [place] Date  11/25/19      PT LONG TERM GOAL #4   Title  bilateral hip flexion >/= 110 degrees so she is able to sit on the commode with her knees above her hips to fully empty her stool    Time  12    Period  Weeks    Status  New    [place] Date  11/25/19            Plan - 09/30/19 1615    Clinical Impression Statement  Patient  has noticed some changes with her exercises. Patient is able to have a bowel movement that slides out. When patient has benefiber the stool gets pasty. Patient understands bowel massage and to do it daily. Patient understands ways to work with eating healther, adding fiber and having more of a routine with eating instead of skipping a meal. Patient still needs to work on her breathing technique. Patient will benefit from skilled therapy to improve pelvic floo rstrength and coordination to reduce fecal incontinence.    Personal Factors and Comorbidities  Comorbidity 2;Fitness;Age    Comorbidities  Diabetes, Hemorroidectomy    Examination-Activity Limitations  Toileting;Continence    Examination-Participation Restrictions  Community Activity    Stability/Clinical Decision Making  Evolving/Moderate complexity    Rehab Potential  Good    PT Frequency  2x / week    PT Duration  12 weeks    PT Treatment/Interventions  Biofeedback;Electrical Stimulation;Neuromuscular re-education;Therapeutic exercise;Therapeutic activities;Patient/family education;Manual techniques;Dry needling    PT Next Visit Plan  internal work on the pelvic floor, pelvic floor EMG for coordination    PT Home Exercise Plan  Access Code: [email or web address]    Consulted and Agree with Plan of Care  Patient       Patient will benefit from skilled therapeutic intervention in order to improve the following deficits and impairments:  Decreased coordination, Decreased range of motion, Increased fascial restricitons, Decreased strength  Visit Diagnosis: Muscle weakness (generalized)  Other lack of coordination  Incontinence of feces, unspecified fecal incontinence type     Problem List Patient Active Problem List   Diagnosis Date Noted  . Uncontrolled type 2 diabetes mellitus with diabetic neuropathy, without long-term current use of insulin ([place]) 10/05/2015  . 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 MUSCULOSKELETAL PAIN 10/01/2009  .  DYSLIPIDEMIA 12/05/2008  . FATTY LIVER DISEASE 10/20/2008  . NUMMULAR ECZEMA 05/22/2008  . FEMALE INFERTILITY 05/01/2007  . ANEMIA, IRON DEFICIENCY 04/03/2007  . SMOKER 11/28/2006  . HYPERTENSION, MILD 11/28/2006  . HEMORRHOIDS, INTERNAL W/O COMPLICATION 06/28/9357  . RHINITIS, ALLERGIC, DUE TO POLLEN 11/28/2006  . GERD 11/28/2006  . DERMATITIS, ATOPIC 11/28/2006  . COCAINE ABUSE, HX OF 11/28/2006  . LIVER FUNCTION TESTS, ABNORMAL 09/14/2006  . GOUT 06/25/2004  . MORTON'S NEUROMA, 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   Earlie Counts, PT 09/30/19 4:18 PM    [facility] [address] [place], [place], [address] Phone: [phone]   Fax:  [phone]  Name: ERDINE HULEN MRN: [id number] Date of Birth: 1970/06/14  PHYSICAL THERAPY DISCHARGE SUMMARY  Visits from Start of Care: 2  Current functional level related to goals / functional outcomes: See above. Patient has to cancel her appointments due to her work schedule and not being able to attend therapy.    Remaining deficits: See above.    Education / Equipment: HEP Plan: Patient agrees to discharge.  Patient goals were not met. Patient is being discharged due to the patient's request. Discharged due to patient not able to attend therapy due to her work schedule. Thank you for the referral. Earlie Counts, PT 11/18/19 9:35 AM   ?????

## 2019-10-02 ENCOUNTER — Other Ambulatory Visit: Payer: Self-pay

## 2019-10-02 ENCOUNTER — Ambulatory Visit: Payer: Managed Care, Other (non HMO) | Admitting: Orthotics

## 2019-10-02 DIAGNOSIS — M722 Plantar fascial fibromatosis: Secondary | ICD-10-CM

## 2019-10-02 DIAGNOSIS — M779 Enthesopathy, unspecified: Secondary | ICD-10-CM

## 2019-10-02 DIAGNOSIS — M2141 Flat foot [pes planus] (acquired), right foot: Secondary | ICD-10-CM

## 2019-10-02 NOTE — Progress Notes (Signed)
Shoes too big, reordering size smaller (7)

## 2019-10-03 ENCOUNTER — Other Ambulatory Visit: Payer: Self-pay

## 2019-10-03 ENCOUNTER — Emergency Department (HOSPITAL_BASED_OUTPATIENT_CLINIC_OR_DEPARTMENT_OTHER)
Admission: EM | Admit: 2019-10-03 | Discharge: 2019-10-03 | Disposition: A | Discharge status: Home / Self Care | Payer: Managed Care, Other (non HMO) | Attending: Emergency Medicine | Admitting: Emergency Medicine

## 2019-10-03 ENCOUNTER — Encounter (HOSPITAL_BASED_OUTPATIENT_CLINIC_OR_DEPARTMENT_OTHER): Payer: Self-pay | Admitting: *Deleted

## 2019-10-03 DIAGNOSIS — Z79899 Other long term (current) drug therapy: Secondary | ICD-10-CM | POA: Insufficient documentation

## 2019-10-03 DIAGNOSIS — Z885 Allergy status to narcotic agent status: Secondary | ICD-10-CM | POA: Insufficient documentation

## 2019-10-03 DIAGNOSIS — Z7984 Long term (current) use of oral hypoglycemic drugs: Secondary | ICD-10-CM | POA: Diagnosis not present

## 2019-10-03 DIAGNOSIS — M79671 Pain in right foot: Secondary | ICD-10-CM | POA: Diagnosis present

## 2019-10-03 DIAGNOSIS — E119 Type 2 diabetes mellitus without complications: Secondary | ICD-10-CM | POA: Insufficient documentation

## 2019-10-03 DIAGNOSIS — Z91040 Latex allergy status: Secondary | ICD-10-CM | POA: Insufficient documentation

## 2019-10-03 DIAGNOSIS — M722 Plantar fascial fibromatosis: Secondary | ICD-10-CM | POA: Diagnosis not present

## 2019-10-03 DIAGNOSIS — Z881 Allergy status to other antibiotic agents status: Secondary | ICD-10-CM | POA: Insufficient documentation

## 2019-10-03 DIAGNOSIS — M79672 Pain in left foot: Secondary | ICD-10-CM | POA: Insufficient documentation

## 2019-10-03 DIAGNOSIS — Z87891 Personal history of nicotine dependence: Secondary | ICD-10-CM | POA: Diagnosis not present

## 2019-10-03 DIAGNOSIS — E785 Hyperlipidemia, unspecified: Secondary | ICD-10-CM | POA: Diagnosis not present

## 2019-10-03 DIAGNOSIS — I10 Essential (primary) hypertension: Secondary | ICD-10-CM | POA: Insufficient documentation

## 2019-10-03 DIAGNOSIS — Z888 Allergy status to other drugs, medicaments and biological substances status: Secondary | ICD-10-CM | POA: Insufficient documentation

## 2019-10-03 MED ORDER — OXYCODONE HCL 5 MG PO TABS
5.0000 mg | ORAL_TABLET | Freq: Once | ORAL | Status: AC
  Administered 2019-10-03: 20:00:00 5 mg via ORAL
  Filled 2019-10-03: qty 1

## 2019-10-03 NOTE — Discharge Instructions (Signed)
You have been diagnosed today with Bilateral Foot Pain, Plantar Fasciitis.   At this time there does not appear to be the presence of an emergent medical condition, however there is always the potential for conditions to change. Please read and follow the below instructions.  Please return to the Emergency Department immediately for any new or worsening symptoms. Please be sure to follow up with your Primary Care Provider within one week regarding your visit today; please call their office to schedule an appointment even if you are feeling better for a follow-up visit. You are given a pain medication today called oxycodone.  This medication will make you drowsy.  Do not drink alcohol or perform any dangerous activities such as driving a car for the rest the day. Please use rest, ice and elevation to help with your pain.  Please call your podiatrist tomorrow morning to schedule a follow-up appointment.  You have been given a work note to use over this weekend to continue resting your feet.  Get help right away if: Your foot is numb or tingling. Your foot or toes are swollen. Your foot or toes turn white or blue. You have warmth and redness along your foot. You have fever or chills You have chest pain or trouble breathing You have any new/concerning or worsening of symptoms  Please read the additional information packets attached to your discharge summary.  Do not take your medicine if  develop an itchy rash, swelling in your mouth or lips, or difficulty breathing; call 911 and seek immediate emergency medical attention if this occurs.  Note: Portions of this text may have been transcribed using voice recognition software. Every effort was made to ensure accuracy; however, inadvertent computerized transcription errors may still be present.

## 2019-10-03 NOTE — ED Provider Notes (Signed)
[facility] Provider Note   CSN: [id number] Arrival date & time: 10/03/19  1642     History Chief Complaint  Patient presents with  . Foot Pain  . Leg Pain    Deborah [name] is a 49 y.o. female history of diabetes, hypertension, hyperlipidemia, GERD, plantar fasciitis, polysubstance abuse.  Patient presents today for bilateral foot pain which she reports is chronic.  She reports that she is currently having an exacerbation of her bilateral plantar fasciitis, current pain has been going on for several weeks.  She reports that in February she saw her podiatrist who performed injections into both of her feet which helped her pain for several weeks however pain has gradually returned.  She describes pain to the bottom of both of her feet, constant burning sensation worsened with ambulation improved with ice and elevation currently moderate in intensity.  She reports that when she walks pain feels like it will radiate up to her bilateral ankles.  Patient reports that yesterday she went to her podiatrist office to receive specialty shoe inserts to help with her plantar fasciitis.  Unfortunately the inserts did not fit correctly and she has to wait until next week until new ones arrive.  Denies fall/injury, fever/chills, numbness/weakness, chest pain/shortness of breath, tingling, extremity swelling/color change, nausea/vomiting, change to her chronic plantar fasciitis pain or any additional concerns.  HPI     Past Medical History:  Diagnosis Date  . Allergy   . Anemia   . Arthritis   . Diabetes mellitus   . Eczema   . GERD (gastroesophageal reflux disease)   . Gout   . Hyperlipidemia    no meds  . Hypertension   . Neuromuscular disorder ([place])   . Plantar fasciitis   . Polysubstance abuse ([place])    ETOH and Cocaine  . Seasonal allergies     Patient Active Problem List   Diagnosis Date Noted  . Uncontrolled type 2 diabetes mellitus with diabetic  neuropathy, without long-term current use of insulin ([place]) 10/05/2015  . 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 MUSCULOSKELETAL PAIN 10/01/2009  . DYSLIPIDEMIA 12/05/2008  . FATTY LIVER DISEASE 10/20/2008  . NUMMULAR ECZEMA 05/22/2008  . FEMALE INFERTILITY 05/01/2007  . ANEMIA, IRON DEFICIENCY 04/03/2007  . SMOKER 11/28/2006  . HYPERTENSION, MILD 11/28/2006  . HEMORRHOIDS, INTERNAL W/O COMPLICATION 90/24/0973  . RHINITIS, ALLERGIC, DUE TO POLLEN 11/28/2006  . GERD 11/28/2006  . DERMATITIS, ATOPIC 11/28/2006  . COCAINE ABUSE, HX OF 11/28/2006  . LIVER FUNCTION TESTS, ABNORMAL 09/14/2006  . GOUT 06/25/2004  . MORTON'S NEUROMA, 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    Past Surgical History:  Procedure Laterality Date  . HEMORRHOID SURGERY       OB History    Gravida  0   Para      Term      Preterm      AB      Living  0     SAB      TAB      Ectopic      Multiple      Live Births              Family History  Problem Relation Age of Onset  . Hypertension Father   . Diabetes Father   . Cancer Father   . Hypertension Other   . Diabetes Other   . Diabetes Mother   . Hypertension Mother   . Diabetes Maternal Grandmother   . Hypertension Maternal Grandmother   .  Diabetes Paternal Grandfather   . Colon cancer Neg Hx   . Colon polyps Neg Hx   . Esophageal cancer Neg Hx   . Rectal cancer Neg Hx   . Stomach cancer Neg Hx     Social History   Tobacco Use  . Smoking status: Former Smoker    Packs/day: 0.50    Quit date: 04/24/2013    Years since quitting: 6.4  . Smokeless tobacco: Former Systems developer    Quit date: 03/14/2013  Substance Use Topics  . Alcohol use: No    Comment: none since 03-14-13  . Drug use: Not Currently    Types: Marijuana, Cocaine    Comment: Quit 04/14/2013 per pt.     Home Medications Prior to Admission medications   Medication Sig Start Date End Date Taking? Authorizing Provider  allopurinol (ZYLOPRIM) 100 MG tablet Take 1 tablet (100 mg total) by  mouth daily. 04/01/19   Copland, Gay Filler, MD  amLODipine (NORVASC) 10 MG tablet Take 1 tablet (10 mg total) by mouth daily. 04/01/19   Copland, Gay Filler, MD  blood glucose meter kit and supplies Dispense based on patient and insurance preference. Use up to four times daily as directed. (FOR ICD-10 E10.9, E11.9). 04/16/19   Copland, Gay Filler, MD  diclofenac (VOLTAREN) 75 MG EC tablet Take 1 tablet (75 mg total) by mouth 2 (two) times daily. 07/30/19   Landis Martins, DPM  esomeprazole (NEXIUM) 20 MG capsule Take 20 mg by mouth daily at 12 noon.    [provider]  fluticasone (FLONASE) 50 MCG/ACT nasal spray Place 2 sprays into both nostrils daily. 06/10/19   Copland, Gay Filler, MD  glipiZIDE (GLUCOTROL XL) 2.5 MG 24 hr tablet Take 1 tablet (2.5 mg total) by mouth daily with breakfast. 04/08/19   Copland, Gay Filler, MD  HYDROcodone-acetaminophen (NORCO) 7.5-325 MG tablet Take 1 tablet by mouth every 6 (six) hours as needed for moderate pain.    [provider]  indomethacin (INDOCIN) 50 MG capsule Take 1 capsule (50 mg total) by mouth 2 (two) times daily. Use as needed for gout flare 04/01/19   Copland, Gay Filler, MD  lisinopril (ZESTRIL) 10 MG tablet Take 1 tablet (10 mg total) by mouth daily. 04/01/19   Copland, Gay Filler, MD  loratadine (CLARITIN) 10 MG tablet TAKE 1 TABLET (10 MG TOTAL) BY MOUTH DAILY. 10/01/19   Copland, Gay Filler, MD  magic mouthwash SOLN Take 5 mLs by mouth.    [provider]  meloxicam (MOBIC) 15 MG tablet Take 1 tablet (15 mg total) by mouth daily. 07/02/19   Landis Martins, DPM  metFORMIN (GLUCOPHAGE) 1000 MG tablet Take 1 tablet (1,000 mg total) by mouth 2 (two) times daily. 04/01/19   Copland, Gay Filler, MD  SUMAtriptan (IMITREX) 50 MG tablet Take 1 dose in case of headache. May repeat in 2 hours if headache persists or recurs.  Max 200 mg per 24 hours 07/08/19   Copland, Gay Filler, MD  triamcinolone cream (KENALOG) 0.1 % Apply 1 application  topically 2 (two) times daily. Use as needed for eczema 04/01/19   Copland, Gay Filler, MD  Wheat Dextrin (BENEFIBER) POWD Use 1 tablespoon three times a day with meals 07/02/19   Mauri Pole, MD    Allergies    Cephalexin, Hydrocodone, Latex, Neomycin-bacitracin zn-polymyx, Sertraline, and Adhesive [tape]  Review of Systems   Review of Systems  Constitutional: Negative.  Negative for chills and fever.  Respiratory: Negative.  Negative for  shortness of breath.   Cardiovascular: Negative.  Negative for chest pain.  Gastrointestinal: Negative.  Negative for nausea and vomiting.  Musculoskeletal: Positive for arthralgias (Bilateral plantar fasciitis). Negative for back pain, joint swelling and neck pain.  Skin: Negative.  Negative for color change and wound.  Neurological: Negative.  Negative for weakness and numbness.    Physical Exam Updated Vital Signs BP (!) 165/98 (BP Location: Left Arm)   Pulse 85   Temp 99.4 F (37.4 C) (Oral)   Resp 20   Ht 4' 11"  (1.499 m)   Wt 83.6 kg   SpO2 100%   BMI 37.22 kg/m   Physical Exam Constitutional:      General: She is not in acute distress.    Appearance: Normal appearance. She is well-developed. She is not ill-appearing or diaphoretic.  HENT:     Head: Normocephalic and atraumatic.     Right Ear: External ear normal.     Left Ear: External ear normal.     Nose: Nose normal.  Eyes:     General: Vision grossly intact. Gaze aligned appropriately.     Pupils: Pupils are equal, round, and reactive to light.  Neck:     Trachea: Trachea and phonation normal. No tracheal deviation.  Cardiovascular:     Rate and Rhythm: Normal rate and regular rhythm.     Pulses: Normal pulses.          Dorsalis pedis pulses are 2+ on the right side and 2+ on the left side.       Posterior tibial pulses are 2+ on the right side and 2+ on the left side.  Pulmonary:     Effort: Pulmonary effort is normal. No respiratory distress.  Abdominal:      General: There is no distension.     Palpations: Abdomen is soft.     Tenderness: There is no abdominal tenderness. There is no guarding or rebound.  Musculoskeletal:        General: Normal range of motion.     Cervical back: Normal range of motion.     Comments: Full range of motion and appropriate strength at bilateral hips, knees, ankles and toes without increase in pain.  Patient reports pain is only with ambulation.  Feet:     Right foot:     Protective Sensation: 5 sites tested. 5 sites sensed.     Skin integrity: Skin integrity normal.     Left foot:     Protective Sensation: 5 sites tested. 5 sites sensed.     Skin integrity: Skin integrity normal.     Comments: Minimal tenderness to bilateral plantar without overlying skin change. Skin:    General: Skin is warm and dry.  Neurological:     Mental Status: She is alert.     GCS: GCS eye subscore is 4. GCS verbal subscore is 5. GCS motor subscore is 6.     Comments: Speech is clear and goal oriented, follows commands Major Cranial nerves without deficit, no facial droop Normal strength in upper and lower extremities bilaterally including dorsiflexion and plantar flexion Sensation normal to light and sharp touch Moves extremities without ataxia, coordination intact  Psychiatric:        Behavior: Behavior normal.    ED Results / Procedures / Treatments   Labs (all labs ordered are listed, but only abnormal results are displayed) Labs Reviewed - No data to display  EKG None  Radiology No results found.  Procedures Procedures (including critical  care time)  Medications Ordered in ED Medications  oxyCODONE (Oxy IR/ROXICODONE) immediate release tablet 5 mg (5 mg Oral Given 10/03/19 1931)    ED Course  I have reviewed the triage vital signs and the nursing notes.  Pertinent labs & imaging results that were available during my care of the patient were reviewed by me and considered in my medical decision making (see  chart for details).    MDM Rules/Calculators/A&P                     49 year old female presents today for bilateral foot pain which she reports is chronic.  She feels this is an exacerbation of her plantar fasciitis.  Unfortunately her specialty inserts that she was placed to pick up yesterday from the podiatry clinic did not fit.  Her pain is only with ambulation, it is improved when she elevates her feet.  There are no overlying skin changes of the feet and no evidence of infection, she has not had any injections in the last 1 month of her feet.  On exam she is neurovascular intact with strong and equal pedal pulses, good sensation and capillary refill to all toes and improved range of motion and strength of all major joints of bilateral lower extremities.  Patient is requesting injections today for plantar fasciitis in addition to a work note for the weekend as she works on her feet and had to leave work early today due to pain.  I have reviewed patient's most recent podiatry visit, this was on 07/30/2019 with Dr. Cannon Kettle.  On review of their assessment and plan it appears diagnosis of plantar fasciitis bilaterally with pes planus of both feet, tendinitis, pain in both feet and hallux rigidus of both feet.  They prescribed diclofenac, they had performed an injection of lidocaine, Marcaine, Kenalog and dexamethasone.  They plan for continual plantar fascial braces and were planning custom insoles and diabetic shoes.  They recommended gentle stretching, icing and surgically compression sleeves.  There was also a podiatry clinical support visit in the chart from yesterday with a brief note of "she was too big, reordering size smaller." - Patient has a ride home from the ER today, she was given 1 oxycodone for pain.  She states understanding of narcotic precautions.  Plan of care at this time is to give work note through Monday.  She will call her podiatrist office tomorrow to schedule a sooner appointment  for reevaluation.  She will continue rest ice and elevation, help patient's symptoms will improve with work note and avoiding ambulation until her insoles come in.  There is no evidence of infection, DVT, cellulitis, septic arthritis, compartment syndrome or other emergent pathologies at this time.  At this time there does not appear to be any evidence of an acute emergency medical condition and the patient appears stable for discharge with appropriate outpatient follow up. Diagnosis was discussed with patient who verbalizes understanding of care plan and is agreeable to discharge. I have discussed return precautions with patient who verbalizes understanding. Patient encouraged to follow-up with their PCP and podiatry. All questions answered.  Note: Portions of this report may have been transcribed using voice recognition software. Every effort was made to ensure accuracy; however, inadvertent computerized transcription errors may still be present. Final Clinical Impression(s) / ED Diagnoses Final diagnoses:  Bilateral foot pain  Plantar fasciitis    Rx / DC Orders ED Discharge Orders    None  Gari Crown 10/03/19 2043    Gareth Morgan, MD 10/05/19 1426

## 2019-10-03 NOTE — ED Triage Notes (Signed)
Hx of plantar fasciitis. Here for pain in her feet and legs.

## 2019-10-04 ENCOUNTER — Telehealth: Payer: Self-pay | Admitting: *Deleted

## 2019-10-04 ENCOUNTER — Other Ambulatory Visit: Payer: Self-pay | Admitting: Sports Medicine

## 2019-10-04 MED ORDER — DICLOFENAC SODIUM 75 MG PO TBEC: 75.0000 mg | DELAYED_RELEASE_TABLET | Freq: Two times a day (BID) | ORAL | 0 refills | Status: DC

## 2019-10-04 MED FILL — DICLOFENAC SODIUM 75 MG TAB: 75 | 15 days supply | Qty: 30 | Fill #0

## 2019-10-04 NOTE — Telephone Encounter (Signed)
-----   Message from [place] sent at 10/04/2019  3:15 PM EDT ----- Regarding: Medication request Patient is requesting a medication-Diclosenac . She is a of Dr. Cannon Kettle. Patient states that she was seen in the ER on last night for plantar fasciitis.Patient has requested that the medication be filled at [facility].

## 2019-10-04 NOTE — Progress Notes (Signed)
Refilled Diclofenac

## 2019-10-04 NOTE — Telephone Encounter (Signed)
Refill sent.

## 2019-10-07 NOTE — Telephone Encounter (Signed)
Left message informing pt Dr. Cannon Kettle had sent rx to the [facility].

## 2019-10-08 ENCOUNTER — Other Ambulatory Visit: Payer: Self-pay | Admitting: Family Medicine

## 2019-10-08 DIAGNOSIS — J3089 Other allergic rhinitis: Secondary | ICD-10-CM

## 2019-10-08 MED FILL — LORATADINE 10 MG TABS: 10 | 90 days supply | Qty: 90 | Fill #0

## 2019-10-10 ENCOUNTER — Ambulatory Visit (INDEPENDENT_AMBULATORY_CARE_PROVIDER_SITE_OTHER): Payer: Managed Care, Other (non HMO) | Admitting: Orthotics

## 2019-10-10 ENCOUNTER — Other Ambulatory Visit: Payer: Self-pay

## 2019-10-10 DIAGNOSIS — E1165 Type 2 diabetes mellitus with hyperglycemia: Secondary | ICD-10-CM

## 2019-10-10 DIAGNOSIS — IMO0002 Reserved for concepts with insufficient information to code with codable children: Secondary | ICD-10-CM

## 2019-10-10 DIAGNOSIS — M2142 Flat foot [pes planus] (acquired), left foot: Secondary | ICD-10-CM

## 2019-10-10 DIAGNOSIS — E1140 Type 2 diabetes mellitus with diabetic neuropathy, unspecified: Secondary | ICD-10-CM | POA: Diagnosis not present

## 2019-10-10 DIAGNOSIS — M2141 Flat foot [pes planus] (acquired), right foot: Secondary | ICD-10-CM

## 2019-10-10 NOTE — Progress Notes (Signed)

## 2019-10-14 ENCOUNTER — Encounter: Payer: Managed Care, Other (non HMO) | Admitting: Physical Therapy

## 2019-10-21 ENCOUNTER — Encounter: Payer: Managed Care, Other (non HMO) | Admitting: Physical Therapy

## 2019-10-26 ENCOUNTER — Encounter: Payer: Self-pay | Admitting: Family Medicine

## 2019-10-26 DIAGNOSIS — R159 Full incontinence of feces: Secondary | ICD-10-CM

## 2019-10-28 ENCOUNTER — Encounter: Payer: Managed Care, Other (non HMO) | Admitting: Physical Therapy

## 2019-10-28 MED FILL — LISINOPRIL 10 MG TABS: 10 | 30 days supply | Qty: 30 | Fill #5

## 2019-10-28 MED FILL — ALLOPURINOL 100 MG TABS: 100 | 30 days supply | Qty: 30 | Fill #5

## 2019-10-28 MED FILL — AMLODIPINE BESYLATE 10 MG T: 10 | 30 days supply | Qty: 30 | Fill #5

## 2019-10-29 NOTE — Addendum Note (Signed)
Addended by: Lamar Blinks C on: 10/29/2019 12:28 PM   Modules accepted: Orders

## 2019-11-05 ENCOUNTER — Encounter: Payer: Self-pay | Admitting: Sports Medicine

## 2019-11-05 ENCOUNTER — Other Ambulatory Visit: Payer: Self-pay

## 2019-11-05 ENCOUNTER — Ambulatory Visit (INDEPENDENT_AMBULATORY_CARE_PROVIDER_SITE_OTHER): Payer: Managed Care, Other (non HMO) | Admitting: Sports Medicine

## 2019-11-05 DIAGNOSIS — IMO0002 Reserved for concepts with insufficient information to code with codable children: Secondary | ICD-10-CM

## 2019-11-05 DIAGNOSIS — M79671 Pain in right foot: Secondary | ICD-10-CM

## 2019-11-05 DIAGNOSIS — M2141 Flat foot [pes planus] (acquired), right foot: Secondary | ICD-10-CM

## 2019-11-05 DIAGNOSIS — M2142 Flat foot [pes planus] (acquired), left foot: Secondary | ICD-10-CM

## 2019-11-05 DIAGNOSIS — M722 Plantar fascial fibromatosis: Secondary | ICD-10-CM

## 2019-11-05 DIAGNOSIS — M7989 Other specified soft tissue disorders: Secondary | ICD-10-CM

## 2019-11-05 DIAGNOSIS — M79672 Pain in left foot: Secondary | ICD-10-CM

## 2019-11-05 DIAGNOSIS — E1165 Type 2 diabetes mellitus with hyperglycemia: Secondary | ICD-10-CM

## 2019-11-05 DIAGNOSIS — I739 Peripheral vascular disease, unspecified: Secondary | ICD-10-CM

## 2019-11-05 DIAGNOSIS — E1140 Type 2 diabetes mellitus with diabetic neuropathy, unspecified: Secondary | ICD-10-CM

## 2019-11-05 MED ORDER — AMITRIPTYLINE HCL 150 MG PO TABS: 75.0000 mg | ORAL_TABLET | Freq: Every day | ORAL | 2 refills | Status: DC

## 2019-11-05 MED ORDER — TRIAMCINOLONE ACETONIDE 10 MG/ML IJ SUSP
10.0000 mg | Freq: Once | INTRAMUSCULAR | Status: AC
  Administered 2019-11-05: 10 mg

## 2019-11-05 MED FILL — AMITRIPTYLINE HCL 75 MG TAB: 75 | 60 days supply | Qty: 60 | Fill #0

## 2019-11-05 NOTE — Progress Notes (Signed)
Subjective: Deborah [name] is a 49 y.o. female patient who returns to office for f/u evaluation of bilateral foot pain, reports that she is still having a lot of pain right greater than left in the bottom of her heels as well as swelling reports that she has been to the ER twice where they have not done anything for her she has been wearing her compression garments and diabetic shoes and taking diclofenac and resting and icing as needed but her symptoms still persist especially on the right.  Patient reports that pain is slightly increased to her feet after a long.  Of time at work reports that it is difficult for her to work 12 hours due to the pain in both feet as well as numbness tingling and burning.  Patient denies any injury or trauma at this time.    FBS not recorded today  Last A1c not recorded.   Patient Active Problem List   Diagnosis Date Noted  . Uncontrolled type 2 diabetes mellitus with diabetic neuropathy, without long-term current use of insulin ([place]) 10/05/2015  . 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 MUSCULOSKELETAL PAIN 10/01/2009  . DYSLIPIDEMIA 12/05/2008  . FATTY LIVER DISEASE 10/20/2008  . NUMMULAR ECZEMA 05/22/2008  . FEMALE INFERTILITY 05/01/2007  . ANEMIA, IRON DEFICIENCY 04/03/2007  . SMOKER 11/28/2006  . HYPERTENSION, MILD 11/28/2006  . HEMORRHOIDS, INTERNAL W/O COMPLICATION 55/97/4163  . RHINITIS, ALLERGIC, DUE TO POLLEN 11/28/2006  . GERD 11/28/2006  . DERMATITIS, ATOPIC 11/28/2006  . COCAINE ABUSE, HX OF 11/28/2006  . LIVER FUNCTION TESTS, ABNORMAL 09/14/2006  . GOUT 06/25/2004  . MORTON'S NEUROMA, 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    Current Outpatient Medications on File Prior to Visit  Medication Sig Dispense Refill  . allopurinol (ZYLOPRIM) 100 MG tablet Take 1 tablet (100 mg total) by mouth daily. 30 tablet 11  . amLODipine (NORVASC) 10 MG tablet Take 1 tablet (10 mg total) by mouth daily. 30 tablet 11  . blood glucose meter kit and supplies Dispense based on patient  and insurance preference. Use up to four times daily as directed. (FOR ICD-10 E10.9, E11.9). 1 each 0  . diclofenac (VOLTAREN) 75 MG EC tablet Take 1 tablet (75 mg total) by mouth 2 (two) times daily. 30 tablet 0  . esomeprazole (NEXIUM) 20 MG capsule Take 20 mg by mouth daily at 12 noon.    . fluticasone (FLONASE) 50 MCG/ACT nasal spray Place 2 sprays into both nostrils daily. 16 g 12  . glipiZIDE (GLUCOTROL XL) 2.5 MG 24 hr tablet Take 1 tablet (2.5 mg total) by mouth daily with breakfast. 30 tablet 11  . HYDROcodone-acetaminophen (NORCO) 7.5-325 MG tablet Take 1 tablet by mouth every 6 (six) hours as needed for moderate pain.    . indomethacin (INDOCIN) 50 MG capsule Take 1 capsule (50 mg total) by mouth 2 (two) times daily. Use as needed for gout flare 60 capsule 3  . lisinopril (ZESTRIL) 10 MG tablet Take 1 tablet (10 mg total) by mouth daily. 30 tablet 11  . loratadine (CLARITIN) 10 MG tablet TAKE 1 TABLET (10 MG TOTAL) BY MOUTH DAILY. 100 tablet 3  . magic mouthwash SOLN Take 5 mLs by mouth.    . meloxicam (MOBIC) 15 MG tablet Take 1 tablet (15 mg total) by mouth daily. 30 tablet 0  . metFORMIN (GLUCOPHAGE) 1000 MG tablet Take 1 tablet (1,000 mg total) by mouth 2 (two) times daily. 60 tablet 11  . SUMAtriptan (IMITREX) 50 MG tablet Take 1 dose in case  of headache. May repeat in 2 hours if headache persists or recurs.  Max 200 mg per 24 hours 10 tablet 3  . triamcinolone cream (KENALOG) 0.1 % Apply 1 application topically 2 (two) times daily. Use as needed for eczema 80 g 2  . Wheat Dextrin (BENEFIBER) POWD Use 1 tablespoon three times a day with meals  0   No current facility-administered medications on file prior to visit.    Allergies  Allergen Reactions  . Cephalexin     REACTION: stomach cramps and hives  . Hydrocodone     headache  . Latex   . Neomycin-Bacitracin Zn-Polymyx     REACTION: burning  . Sertraline Hives and Itching  . Adhesive [Tape] Hives and Rash     Objective:  General: Alert and oriented x3 in no acute distress  Dermatology: No open lesions bilateral lower extremities, no webspace macerations, no ecchymosis bilateral, all nails x 10 are well manicured.  Vascular: Dorsalis Pedis and Posterior Tibial pedal pulses palpable, Capillary Fill Time 3 seconds,(+) pedal hair growth bilateral, trace edema bilateral lower extremities like previous, Temperature gradient within normal limits.  Neurology: Gross sensation intact via light touch bilateral.  Subjective numbness tingling and burning right greater than left.  Musculoskeletal: Mild tenderness with palpation at medial calcaneal tubercle and extending into the arch bilateral, right greater than left.  There is midtarsal breech supportive of pes planus.  Limited 1st MTPJ and ankle range of motion bilateral.  Strength within normal limits in all groups bilateral.   Assessment and Plan: Problem List Items Addressed This Visit      Endocrine   Uncontrolled type 2 diabetes mellitus with diabetic neuropathy, without long-term current use of insulin ([place])    Other Visit Diagnoses    Plantar fasciitis, bilateral    -  Primary   Pes planus of both feet       Pain in both feet       PVD (peripheral vascular disease) ([facility])       Swelling of both lower extremities          -Complete examination performed -Discussed treatment options -After oral consent and aseptic prep, injected a mixture containing 1 ml of 2%  plain lidocaine, 1 ml 0.5% plain marcaine, 0.5 ml of kenalog 10 and 0.5 ml of dexamethasone phosphate into right plantar heel at plantar fascial insertion at the area of most pain to patient tolerance without complication. Post-injection care discussed with patient.  -Recommend rest ice elevation and continue with good supportive diabetic shoes as tolerated -Advised patient to continue with compression garments we will order duplex studies to further evaluate venous structures to see  if there is any other things that are contributing to her swelling advised patient if her reflux tests are negative may benefit from adding on diuretic of which she can discuss further with her PCP to help with edema control -Prescribed amitriptyline for patient for numbness tingling burning likely related to diabetic neuropathy which also could be secondary to excessive swelling which could be aggravating her nerves -Work excuse given patient to resume work on Thursday maximum of 8 hours per shift due to foot pain -Patient to return to office after venous reflux studies or sooner if condition worsens.  Landis Martins, DPM

## 2019-11-07 ENCOUNTER — Telehealth: Payer: Self-pay | Admitting: *Deleted

## 2019-11-07 DIAGNOSIS — M7989 Other specified soft tissue disorders: Secondary | ICD-10-CM

## 2019-11-07 DIAGNOSIS — I739 Peripheral vascular disease, unspecified: Secondary | ICD-10-CM

## 2019-11-07 NOTE — Telephone Encounter (Signed)
Faxed orders to [facility]. 

## 2019-11-07 NOTE — Telephone Encounter (Signed)
-----   Message from [place], [place] sent at 11/05/2019 10:13 PM EDT ----- Regarding: Venous Reflux studies PVD eval for regurg, history of swelling in both legs not improved with compression stockings

## 2019-11-08 DIAGNOSIS — M79676 Pain in unspecified toe(s): Secondary | ICD-10-CM

## 2019-11-25 ENCOUNTER — Ambulatory Visit: Payer: Managed Care, Other (non HMO) | Admitting: Physical Therapy

## 2019-11-26 ENCOUNTER — Ambulatory Visit (HOSPITAL_COMMUNITY)
Admission: RE | Admit: 2019-11-26 | Discharge: 2019-11-26 | Disposition: A | Discharge status: Home / Self Care | Payer: Managed Care, Other (non HMO) | Source: Ambulatory Visit | Attending: Cardiovascular Disease | Admitting: Cardiovascular Disease

## 2019-11-26 ENCOUNTER — Other Ambulatory Visit: Payer: Self-pay

## 2019-11-26 DIAGNOSIS — M7989 Other specified soft tissue disorders: Secondary | ICD-10-CM | POA: Diagnosis not present

## 2019-11-26 DIAGNOSIS — I739 Peripheral vascular disease, unspecified: Secondary | ICD-10-CM | POA: Diagnosis present

## 2019-12-02 ENCOUNTER — Encounter: Payer: Managed Care, Other (non HMO) | Admitting: Physical Therapy

## 2019-12-02 MED FILL — ALLOPURINOL 100 MG TABS: 100 | 30 days supply | Qty: 30 | Fill #6

## 2019-12-02 MED FILL — AMLODIPINE BESYLATE 10 MG T: 10 | 30 days supply | Qty: 30 | Fill #6

## 2019-12-02 MED FILL — LISINOPRIL 10 MG TABS: 10 | 30 days supply | Qty: 30 | Fill #6

## 2019-12-11 MED FILL — GLIPIZIDE ER 2.5 MG TB24: 2.5 | 30 days supply | Qty: 30 | Fill #8

## 2019-12-23 MED FILL — METFORMIN HCL 1000 MG TABS: 1000 | 30 days supply | Qty: 60 | Fill #8

## 2019-12-26 MED FILL — LISINOPRIL 10 MG TABS: 10 | 30 days supply | Qty: 30 | Fill #7

## 2019-12-26 MED FILL — ALLOPURINOL 100 MG TABS: 100 | 30 days supply | Qty: 30 | Fill #7

## 2019-12-26 MED FILL — AMLODIPINE BESYLATE 10 MG T: 10 | 30 days supply | Qty: 30 | Fill #7

## 2020-01-13 MED FILL — SUMATRIPTAN SUCC 50 MG TAB: 50 | 30 days supply | Qty: 9 | Fill #1

## 2020-01-13 MED FILL — GLIPIZIDE ER 2.5 MG TB24: 2.5 | 30 days supply | Qty: 30 | Fill #9

## 2020-01-22 MED FILL — METFORMIN HCL 1000 MG TABS: 1000 | 30 days supply | Qty: 60 | Fill #9

## 2020-01-22 MED FILL — AMLODIPINE BESYLATE 10 MG T: 10 | 30 days supply | Qty: 30 | Fill #8

## 2020-01-27 MED FILL — LISINOPRIL 10 MG TABS: 10 | 30 days supply | Qty: 30 | Fill #8

## 2020-01-27 MED FILL — ALLOPURINOL 100 MG TABS: 100 | 30 days supply | Qty: 30 | Fill #8

## 2020-02-11 MED FILL — GLIPIZIDE ER 2.5 MG TB24: 2.5 | 30 days supply | Qty: 30 | Fill #10

## 2020-02-24 ENCOUNTER — Ambulatory Visit: Payer: Managed Care, Other (non HMO) | Attending: Internal Medicine

## 2020-02-24 DIAGNOSIS — Z23 Encounter for immunization: Secondary | ICD-10-CM

## 2020-02-24 MED FILL — METFORMIN HCL 1000 MG TABS: 1000 | 30 days supply | Qty: 60 | Fill #10

## 2020-02-24 MED FILL — ALLOPURINOL 100 MG TABLET: 100 | 30 days supply | Qty: 30 | Fill #9

## 2020-02-24 MED FILL — LISINOPRIL 10 MG TABS: 10 | 30 days supply | Qty: 30 | Fill #9

## 2020-02-24 MED FILL — AMLODIPINE BESYLATE 10 MG T: 10 | 30 days supply | Qty: 30 | Fill #9

## 2020-02-24 NOTE — Progress Notes (Signed)
   Covid-19 Vaccination Clinic  Name:  Deborah [name]    MRN: [id number] DOB: 06-May-1971  02/24/2020  [name] was observed post Covid-19 immunization for 15 minutes without incident. She was provided with Vaccine Information Sheet and instruction to access the V-Safe system. Vaccinated by Harriet Pho  [name] was instructed to call 911 with any severe reactions post vaccine: Marland Kitchen Difficulty breathing  . Swelling of face and throat  . A fast heartbeat  . A bad rash all over body  . Dizziness and weakness   Immunizations Administered    Name Date Dose VIS Date Route   Pfizer COVID-19 Vaccine 02/24/2020  3:52 PM 0.3 mL 07/31/2018 Intramuscular   Manufacturer: [employer], [employer]   Lot: [id number]   NDC: [id number]

## 2020-02-25 ENCOUNTER — Ambulatory Visit: Payer: Managed Care, Other (non HMO)

## 2020-02-28 MED FILL — PFIZER-BIONTECH COVID-19 VA: 30 | 1 days supply | Qty: 0 | Fill #0

## 2020-02-29 NOTE — Progress Notes (Addendum)
[facility] [address], [place], [place] [address] [phone]  Date:  03/02/2020   Name:  Deborah [name]   DOB:  10/16/1970   MRN:  [id number]  PCP:  Darreld Mclean, MD    Chief Complaint: Diabetes (6 month follow up, FLU vaccine), Paperwork (handicap form, disability, due to ankle pain), and Joint Swelling (ankle swelling, seeing podiatry, bilateral ankle pain)   History of Present Illness:  Deborah [name] is a 49 y.o. 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- she is going to be seen by [facility] in about 1 week to discuss some other options  She is currently using FMLA due to foot pain- she is seeing podiatry Dr SCannon KettleShe is suffering from persistent plantar fasciitis She is on light duty; instead of working 3 x 12-hour shifts and one 6-hour shift, she is working 3 x 8-hour shifts and 6-hour shift She wonders about possibly using short-term disability, I asked her to please speak with her HR representative at work and let me know what else I can do to help She needs a new handicapped placard for her car-she had one for 6 months which is soon expiring  A1c due today-can update all routine labs Flu vaccine- today Eye exam- earlier today  She is getting her COVID-19 series currently; have first dose so far  Allopurinol Amitriptyline 75 bedtime Amlodipine Glipizide 2.5 daily Indomethacin as needed Lisinopril Metformin Patient Active Problem List   Diagnosis Date Noted  . Uncontrolled type 2 diabetes mellitus with diabetic neuropathy, without long-term current use of insulin ([place] 10/05/2015  . 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 MUSCULOSKELETAL PAIN  10/01/2009  . DYSLIPIDEMIA 12/05/2008  . FATTY LIVER DISEASE 10/20/2008  . NUMMULAR ECZEMA 05/22/2008  . FEMALE INFERTILITY 05/01/2007  . ANEMIA, IRON DEFICIENCY 04/03/2007  . SMOKER 11/28/2006  . HYPERTENSION, MILD 11/28/2006  . HEMORRHOIDS, INTERNAL W/O COMPLICATION 014/43/1540 . RHINITIS, ALLERGIC, DUE TO POLLEN 11/28/2006  . GERD 11/28/2006  . DERMATITIS, ATOPIC 11/28/2006  . COCAINE ABUSE, HX OF 11/28/2006  . LIVER FUNCTION TESTS, ABNORMAL 09/14/2006  . GOUT 06/25/2004  . 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 Allergy   . Anemia   . Arthritis   . Diabetes mellitus   . Eczema   . GERD (gastroesophageal reflux disease)   . Gout   . Hyperlipidemia    no meds  . Hypertension   . Neuromuscular disorder ([place]   . Plantar fasciitis   . Polysubstance abuse ([place]    ETOH and Cocaine  . Seasonal allergies     Past Surgical History:  Procedure Laterality Date  . HEMORRHOID SURGERY      Social History   Tobacco Use  . Smoking status: Former Smoker    Packs/day: 0.50    Quit date: 04/24/2013    Years since quitting: 6.8  . Smokeless tobacco: Former [employer] developer   Quit date: 03/14/2013  Vaping Use  . Vaping Use: Never used  Substance Use Topics  . Alcohol use: No    Comment: none since 03-14-13  . Drug use: Not Currently    Types: Marijuana, Cocaine    Comment: Quit  04/14/2013 per pt.     Family History  Problem Relation Age of Onset  . Hypertension Father   . Diabetes Father   . Cancer Father   . Hypertension Other   . Diabetes Other   . Diabetes Mother   . Hypertension Mother   . Diabetes Maternal Grandmother   . Hypertension Maternal Grandmother   . Diabetes Paternal Grandfather   . Colon cancer Neg Hx   . Colon polyps Neg Hx   . Esophageal cancer Neg Hx   . Rectal cancer Neg Hx   . Stomach cancer Neg Hx     Allergies  Allergen Reactions  . Cephalexin     REACTION: stomach cramps and hives  . Hydrocodone      headache  . Latex   . Neomycin-Bacitracin Zn-Polymyx     REACTION: burning  . Sertraline Hives and Itching  . Adhesive [Tape] Hives and Rash    Medication list has been reviewed and updated.  Current Outpatient Medications on File Prior to Visit  Medication Sig Dispense Refill  . allopurinol (ZYLOPRIM) 100 MG tablet Take 1 tablet (100 mg total) by mouth daily. 30 tablet 11  . amitriptyline (ELAVIL) 150 MG tablet Take 0.5 tablets (75 mg total) by mouth at bedtime. 30 tablet 2  . amLODipine (NORVASC) 10 MG tablet Take 1 tablet (10 mg total) by mouth daily. 30 tablet 11  . blood glucose meter kit and supplies Dispense based on patient and insurance preference. Use up to four times daily as directed. (FOR ICD-10 E10.9, E11.9). 1 each 0  . diclofenac (VOLTAREN) 75 MG EC tablet Take 1 tablet (75 mg total) by mouth 2 (two) times daily. 30 tablet 0  . fluticasone (FLONASE) 50 MCG/ACT nasal spray Place 2 sprays into both nostrils daily. 16 g 12  . glipiZIDE (GLUCOTROL XL) 2.5 MG 24 hr tablet Take 1 tablet (2.5 mg total) by mouth daily with breakfast. 30 tablet 11  . HYDROcodone-acetaminophen (NORCO) 7.5-325 MG tablet Take 1 tablet by mouth every 6 (six) hours as needed for moderate pain.    . indomethacin (INDOCIN) 50 MG capsule Take 1 capsule (50 mg total) by mouth 2 (two) times daily. Use as needed for gout flare 60 capsule 3  . lisinopril (ZESTRIL) 10 MG tablet Take 1 tablet (10 mg total) by mouth daily. 30 tablet 11  . loratadine (CLARITIN) 10 MG tablet TAKE 1 TABLET (10 MG TOTAL) BY MOUTH DAILY. 100 tablet 3  . meloxicam (MOBIC) 15 MG tablet Take 1 tablet (15 mg total) by mouth daily. 30 tablet 0  . metFORMIN (GLUCOPHAGE) 1000 MG tablet Take 1 tablet (1,000 mg total) by mouth 2 (two) times daily. 60 tablet 11  . SUMAtriptan (IMITREX) 50 MG tablet Take 1 dose in case of headache. May repeat in 2 hours if headache persists or recurs.  Max 200 mg per 24 hours 10 tablet 3  . triamcinolone cream  (KENALOG) 0.1 % Apply 1 application topically 2 (two) times daily. Use as needed for eczema 80 g 2  . Wheat Dextrin (BENEFIBER) POWD Use 1 tablespoon three times a day with meals  0   No current facility-administered medications on file prior to visit.    Review of Systems:  As per HPI- otherwise negative.   Physical Examination: Vitals:   03/02/20 1354  BP: 124/76  Pulse: 86  Resp: 16  SpO2: 98%   Vitals:   03/02/20 1354  Weight: 181 lb (82.1 kg)  Height: 4'  11" (1.499 m)   Body mass index is 36.56 kg/m. Ideal Body Weight: Weight in (lb) to have BMI = 25: 123.5  GEN: no acute distress.  Obese, looks well and her normal self HEENT: Atraumatic, Normocephalic.  Ears and Nose: No external deformity. CV: RRR, No M/G/R. No JVD. No thrill. No extra heart sounds. PULM: CTA B, no wheezes, crackles, rhonchi. No retractions. No resp. distress. No accessory muscle use. EXTR: No c/c/e.  She has minimal fullness of her ankles bilaterally PSYCH: Normally interactive. Conversant.    Assessment and Plan: Mixed hyperlipidemia - Plan: Lipid panel  Essential hypertension - Plan: CBC, Comprehensive metabolic panel, amLODipine (NORVASC) 10 MG tablet, lisinopril (ZESTRIL) 10 MG tablet  Controlled type 2 diabetes mellitus without complication, without long-term current use of insulin (HCC) - Plan: Comprehensive metabolic panel, Hemoglobin A1c, Microalbumin / creatinine urine ratio, glipiZIDE (GLUCOTROL XL) 2.5 MG 24 hr tablet  Screening for thyroid disorder - Plan: TSH  Chronic gout without tophus, unspecified cause, unspecified site - Plan: Uric acid  Idiopathic chronic gout of multiple sites without tophus - Plan: allopurinol (ZYLOPRIM) 100 MG tablet, indomethacin (INDOCIN) 50 MG capsule  Non-seasonal allergic rhinitis, unspecified trigger - Plan: fluticasone (FLONASE) 50 MCG/ACT nasal spray  Controlled type 2 diabetes mellitus with complication, without long-term current use of  insulin (HCC) - Plan: lisinopril (ZESTRIL) 10 MG tablet, metFORMIN (GLUCOPHAGE) 1000 MG tablet  Other allergic rhinitis - Plan: loratadine (CLARITIN) 10 MG tablet  Frequent headaches - Plan: SUMAtriptan (IMITREX) 50 MG tablet  Eczema, unspecified type - Plan: triamcinolone cream (KENALOG) 0.1 %  Needs flu shot - Plan: Flu Vaccine QUAD 6+ mos PF IM (Fluarix Quad PF)  Patient today for a follow-up visit Blood pressure under good control, continue amlodipine and lisinopril Check on diabetes control today, labs are pending Thyroid, uric acid pending Refill medication for allergies and headaches Given flu vaccine today, she is in the process of getting her COVID-19 vaccine Will plan further follow- up pending labs.  This visit occurred during the SARS-CoV-2 public health emergency.  Safety protocols were in place, including screening questions prior to the visit, additional usage of staff PPE, and extensive cleaning of exam room while observing appropriate contact time as indicated for disinfecting solutions.    Signed Lamar Blinks, MD  Addendum 9/28, received her labs as below.  Message to patient  A1c is stable as previous Very mild anemia, she had a colonoscopy last year Results for orders placed or performed in visit on 03/02/20  CBC  Result Value Ref Range   WBC 9.2 3.8 - 10.8 Thousand/uL   RBC 4.31 3.80 - 5.10 Million/uL   Hemoglobin 11.4 (L) 11.7 - 15.5 g/dL   HCT 34.9 (L) 35 - 45 %   MCV 81.0 80.0 - 100.0 fL   MCH 26.5 (L) 27.0 - 33.0 pg   MCHC 32.7 32.0 - 36.0 g/dL   RDW 14.0 11.0 - 15.0 %   Platelets 253 140 - 400 Thousand/uL   MPV 11.1 7.5 - 12.5 fL  Comprehensive metabolic panel  Result Value Ref Range   Glucose, Bld 113 (H) 65 - 99 mg/dL   BUN 12 7 - 25 mg/dL   Creat 0.68 0.50 - 1.05 mg/dL   BUN/Creatinine Ratio NOT APPLICABLE 6 - 22 (calc)   Sodium 137 135 - 146 mmol/L   Potassium 3.9 3.5 - 5.3 mmol/L   Chloride 100 98 - 110 mmol/L   CO2 26 20 - 32 mmol/L  Calcium 9.6 8.6 - 10.4 mg/dL   Total Protein 7.2 6.1 - 8.1 g/dL   Albumin 4.4 3.6 - 5.1 g/dL   Globulin 2.8 1.9 - 3.7 g/dL (calc)   AG Ratio 1.6 1.0 - 2.5 (calc)   Total Bilirubin 0.3 0.2 - 1.2 mg/dL   Alkaline phosphatase (APISO) 96 37 - 153 U/L   AST 31 10 - 35 U/L   ALT 32 (H) 6 - 29 U/L  Hemoglobin A1c  Result Value Ref Range   Hgb A1c MFr Bld 7.4 (H) <5.7 % of total Hgb   Mean Plasma Glucose 166 (calc)   eAG (mmol/L) 9.2 (calc)  Lipid panel  Result Value Ref Range   Cholesterol 167 <200 mg/dL   HDL 73 > OR = 50 mg/dL   Triglycerides 67 <150 mg/dL   LDL Cholesterol (Calc) 80 mg/dL (calc)   Total CHOL/HDL Ratio 2.3 <5.0 (calc)   Non-HDL Cholesterol (Calc) 94 <130 mg/dL (calc)  TSH  Result Value Ref Range   TSH 1.49 mIU/L  Uric acid  Result Value Ref Range   Uric Acid, Serum 4.8 2.5 - 7.0 mg/dL

## 2020-02-29 NOTE — Patient Instructions (Addendum)
It was great to see you again today, I will be in touch with your labs as soon as possible  Assuming all is well, we can plan to visit in 4 to 6 months  I gave you a handicapped placard for another 6 months today Please take to your HR person at work about your disability benefits.   I am glad to complete any needed paperwork for you  Flu shot today Be sure to get your 2nd dose of covid 19 vaccine as planned

## 2020-03-02 ENCOUNTER — Other Ambulatory Visit: Payer: Self-pay | Admitting: Family Medicine

## 2020-03-02 ENCOUNTER — Other Ambulatory Visit: Payer: Self-pay

## 2020-03-02 ENCOUNTER — Encounter: Payer: Self-pay | Admitting: Family Medicine

## 2020-03-02 ENCOUNTER — Telehealth: Payer: Self-pay | Admitting: Family Medicine

## 2020-03-02 ENCOUNTER — Ambulatory Visit: Payer: Managed Care, Other (non HMO) | Admitting: Family Medicine

## 2020-03-02 VITALS — BP 124/76 | HR 86 | Resp 16 | Ht 59.0 in | Wt 181.0 lb

## 2020-03-02 DIAGNOSIS — R519 Headache, unspecified: Secondary | ICD-10-CM

## 2020-03-02 DIAGNOSIS — Z1329 Encounter for screening for other suspected endocrine disorder: Secondary | ICD-10-CM | POA: Diagnosis not present

## 2020-03-02 DIAGNOSIS — E119 Type 2 diabetes mellitus without complications: Secondary | ICD-10-CM | POA: Diagnosis not present

## 2020-03-02 DIAGNOSIS — J3089 Other allergic rhinitis: Secondary | ICD-10-CM

## 2020-03-02 DIAGNOSIS — M1A9XX0 Chronic gout, unspecified, without tophus (tophi): Secondary | ICD-10-CM

## 2020-03-02 DIAGNOSIS — M1A09X0 Idiopathic chronic gout, multiple sites, without tophus (tophi): Secondary | ICD-10-CM

## 2020-03-02 DIAGNOSIS — E782 Mixed hyperlipidemia: Secondary | ICD-10-CM | POA: Diagnosis not present

## 2020-03-02 DIAGNOSIS — Z23 Encounter for immunization: Secondary | ICD-10-CM | POA: Diagnosis not present

## 2020-03-02 DIAGNOSIS — E118 Type 2 diabetes mellitus with unspecified complications: Secondary | ICD-10-CM

## 2020-03-02 DIAGNOSIS — L309 Dermatitis, unspecified: Secondary | ICD-10-CM

## 2020-03-02 DIAGNOSIS — I10 Essential (primary) hypertension: Secondary | ICD-10-CM

## 2020-03-02 MED ORDER — LORATADINE 10 MG PO TABS: 10.0000 mg | ORAL_TABLET | Freq: Every day | ORAL | 3 refills | Status: DC

## 2020-03-02 MED ORDER — FLUTICASONE PROPIONATE 50 MCG/ACT NA SUSP: 2.0000 | Freq: Every day | NASAL | 12 refills | Status: DC

## 2020-03-02 MED ORDER — SUMATRIPTAN SUCCINATE 50 MG PO TABS: ORAL_TABLET | ORAL | 3 refills | Status: DC

## 2020-03-02 MED ORDER — AMLODIPINE BESYLATE 10 MG PO TABS: 10.0000 mg | ORAL_TABLET | Freq: Every day | ORAL | 11 refills | Status: DC

## 2020-03-02 MED ORDER — TRIAMCINOLONE ACETONIDE 0.1 % EX CREA: 1.0000 "application " | TOPICAL_CREAM | Freq: Two times a day (BID) | CUTANEOUS | 2 refills | Status: DC

## 2020-03-02 MED ORDER — INDOMETHACIN 50 MG PO CAPS: 50.0000 mg | ORAL_CAPSULE | Freq: Two times a day (BID) | ORAL | 3 refills | Status: DC

## 2020-03-02 MED ORDER — METFORMIN HCL 1000 MG PO TABS: 1000.0000 mg | ORAL_TABLET | Freq: Two times a day (BID) | ORAL | 11 refills | Status: DC

## 2020-03-02 MED ORDER — ALLOPURINOL 100 MG PO TABS: 100.0000 mg | ORAL_TABLET | Freq: Every day | ORAL | 11 refills | Status: DC

## 2020-03-02 MED ORDER — LISINOPRIL 10 MG PO TABS: 10.0000 mg | ORAL_TABLET | Freq: Every day | ORAL | 11 refills | Status: DC

## 2020-03-02 MED ORDER — GLIPIZIDE ER 2.5 MG PO TB24: 2.5000 mg | ORAL_TABLET | Freq: Every day | ORAL | 11 refills | Status: DC

## 2020-03-02 MED FILL — INDOMETHACIN 50 MG CAPSULE: 50 | 30 days supply | Qty: 60 | Fill #0

## 2020-03-02 MED FILL — GLIPIZIDE ER 2.5 MG TB24: 2.5 | 30 days supply | Qty: 30 | Fill #0

## 2020-03-02 MED FILL — TRIAMCINOLONE ACETONIDE 0.1: 0.1 | 30 days supply | Qty: 80 | Fill #0

## 2020-03-02 MED FILL — LORATADINE 10 MG TABS: 10 | 90 days supply | Qty: 90 | Fill #0

## 2020-03-02 MED FILL — FLUTICASONE PROP 50 MCG SPR: 50 | 30 days supply | Qty: 16 | Fill #0

## 2020-03-02 NOTE — Telephone Encounter (Signed)
Pt dropped of Short Term Disability form for [place] to sign and fill out.   put into [place] bin up front

## 2020-03-03 ENCOUNTER — Encounter: Payer: Self-pay | Admitting: Family Medicine

## 2020-03-03 DIAGNOSIS — E119 Type 2 diabetes mellitus without complications: Secondary | ICD-10-CM

## 2020-03-03 LAB — LIPID PANEL
Cholesterol: 167 mg/dL (ref <200)
HDL: 73 mg/dL (ref >=50)
LDL Cholesterol (Calc): 80 mg/dL (calc)
Non-HDL Cholesterol (Calc): 94 mg/dL (calc) (ref <130)
Total CHOL/HDL Ratio: 2.3 (calc) (ref <5.0)
Triglycerides: 67 mg/dL (ref <150)

## 2020-03-03 LAB — CBC
HCT: 34.9 % — ABNORMAL LOW (ref 35.0–45.0)
Hemoglobin: 11.4 g/dL — ABNORMAL LOW (ref 11.7–15.5)
MCH: 26.5 pg — ABNORMAL LOW (ref 27.0–33.0)
MCHC: 32.7 g/dL (ref 32.0–36.0)
MCV: 81 fL (ref 80.0–100.0)
MPV: 11.1 fL (ref 7.5–12.5)
Platelets: 253 10*3/uL (ref 140–400)
RBC: 4.31 10*6/uL (ref 3.80–5.10)
RDW: 14 % (ref 11.0–15.0)
WBC: 9.2 10*3/uL (ref 3.8–10.8)

## 2020-03-03 LAB — COMPREHENSIVE METABOLIC PANEL
AG Ratio: 1.6 (calc) (ref 1.0–2.5)
ALT: 32 U/L — ABNORMAL HIGH (ref 6–29)
AST: 31 U/L (ref 10–35)
Albumin: 4.4 g/dL (ref 3.6–5.1)
Alkaline phosphatase (APISO): 96 U/L (ref 37–153)
BUN: 12 mg/dL (ref 7–25)
CO2: 26 mmol/L (ref 20–32)
Calcium: 9.6 mg/dL (ref 8.6–10.4)
Chloride: 100 mmol/L (ref 98–110)
Creat: 0.68 mg/dL (ref 0.50–1.05)
Globulin: 2.8 g/dL (calc) (ref 1.9–3.7)
Glucose, Bld: 113 mg/dL — ABNORMAL HIGH (ref 65–99)
Potassium: 3.9 mmol/L (ref 3.5–5.3)
Sodium: 137 mmol/L (ref 135–146)
Total Bilirubin: 0.3 mg/dL (ref 0.2–1.2)
Total Protein: 7.2 g/dL (ref 6.1–8.1)

## 2020-03-03 LAB — HEMOGLOBIN A1C
Hgb A1c MFr Bld: 7.4 % of total Hgb — ABNORMAL HIGH (ref <5.7)
Mean Plasma Glucose: 166 (calc)
eAG (mmol/L): 9.2 (calc)

## 2020-03-03 LAB — MICROALBUMIN / CREATININE URINE RATIO
Creatinine, Urine: 97 mg/dL (ref 20–275)
Microalb Creat Ratio: 5 mcg/mg creat (ref <30)
Microalb, Ur: 0.5 mg/dL

## 2020-03-03 LAB — URIC ACID: Uric Acid, Serum: 4.8 mg/dL (ref 2.5–7.0)

## 2020-03-03 LAB — TSH: TSH: 1.49 mIU/L

## 2020-03-03 NOTE — Telephone Encounter (Signed)
Placed in provider folder for review. Visit 03/02/2020.

## 2020-03-04 MED ORDER — GLIPIZIDE ER 5 MG PO TB24: 5.0000 mg | ORAL_TABLET | Freq: Every day | ORAL | 3 refills | Status: DC

## 2020-03-05 ENCOUNTER — Other Ambulatory Visit: Payer: Self-pay | Admitting: Family Medicine

## 2020-03-05 MED ORDER — GLIPIZIDE ER 5 MG PO TB24: 5.0000 mg | ORAL_TABLET | Freq: Every day | ORAL | 11 refills | Status: DC

## 2020-03-05 MED ORDER — GLIPIZIDE ER 5 MG PO TB24: 5.0000 mg | ORAL_TABLET | Freq: Every day | ORAL | 3 refills | Status: DC

## 2020-03-05 MED FILL — glipiZIDE ER 5 MG TB24: 5 | 90 days supply | Qty: 90 | Fill #0

## 2020-03-05 NOTE — Addendum Note (Signed)
Addended by: Lamar Blinks C on: 03/05/2020 04:11 PM   Modules accepted: Orders

## 2020-03-05 NOTE — Addendum Note (Signed)
Addended by: Lamar Blinks C on: 03/05/2020 07:58 PM   Modules accepted: Orders

## 2020-03-12 NOTE — Telephone Encounter (Signed)
Discussed STD with pt.  She has failed to get better with light duty Her podiatrist would like to get her off her feet entirely for a while, and see how she does Will complete forms for pt, will have her out from now until 6 weeks from now 04/23/20 Pt will pick up paperwork and instructions from myself

## 2020-03-17 ENCOUNTER — Other Ambulatory Visit (HOSPITAL_BASED_OUTPATIENT_CLINIC_OR_DEPARTMENT_OTHER): Payer: Self-pay | Admitting: Internal Medicine

## 2020-03-17 ENCOUNTER — Ambulatory Visit: Payer: Managed Care, Other (non HMO) | Attending: Internal Medicine

## 2020-03-17 DIAGNOSIS — Z23 Encounter for immunization: Secondary | ICD-10-CM

## 2020-03-17 NOTE — Progress Notes (Signed)
   Covid-19 Vaccination Clinic  Name:  Deborah [name]    MRN: [id number] DOB: Apr 30, 1971  03/17/2020  [name] was observed post Covid-19 immunization for 15 minutes without incident. She was provided with Vaccine Information Sheet and instruction to access the V-Safe system. Vaccinated by [employer].  [name] was instructed to call 911 with any severe reactions post vaccine: Marland Kitchen Difficulty breathing  . Swelling of face and throat  . A fast heartbeat  . A bad rash all over body  . Dizziness and weakness   Immunizations Administered    Name Date Dose VIS Date Route   Pfizer COVID-19 Vaccine 03/17/2020  9:17 AM 0.3 mL 07/31/2018 Intramuscular   Manufacturer: [place]   Lot: [place]: [id number]

## 2020-03-23 ENCOUNTER — Encounter: Payer: Self-pay | Admitting: Sports Medicine

## 2020-03-23 MED FILL — AMLODIPINE BESYLATE 10 MG T: 10 | 30 days supply | Qty: 30 | Fill #10

## 2020-03-23 MED FILL — METFORMIN HCL 1000 MG TABS: 1000 | 30 days supply | Qty: 60 | Fill #11

## 2020-03-23 MED FILL — ALLOPURINOL 100 MG TABLET: 100 | 30 days supply | Qty: 30 | Fill #10

## 2020-03-25 MED FILL — PFIZER-BIONTECH COVID-19 VA: 30 | 1 days supply | Qty: 0 | Fill #0

## 2020-04-03 MED FILL — LISINOPRIL 10 MG TABS: 10 | 30 days supply | Qty: 30 | Fill #0

## 2020-04-09 ENCOUNTER — Other Ambulatory Visit: Payer: Self-pay

## 2020-04-09 ENCOUNTER — Encounter: Payer: Self-pay | Admitting: Sports Medicine

## 2020-04-09 ENCOUNTER — Ambulatory Visit: Payer: Managed Care, Other (non HMO) | Admitting: Sports Medicine

## 2020-04-09 ENCOUNTER — Other Ambulatory Visit: Payer: Self-pay | Admitting: Sports Medicine

## 2020-04-09 DIAGNOSIS — E1165 Type 2 diabetes mellitus with hyperglycemia: Secondary | ICD-10-CM

## 2020-04-09 DIAGNOSIS — E1140 Type 2 diabetes mellitus with diabetic neuropathy, unspecified: Secondary | ICD-10-CM

## 2020-04-09 DIAGNOSIS — M79672 Pain in left foot: Secondary | ICD-10-CM

## 2020-04-09 DIAGNOSIS — M7989 Other specified soft tissue disorders: Secondary | ICD-10-CM | POA: Diagnosis not present

## 2020-04-09 DIAGNOSIS — M722 Plantar fascial fibromatosis: Secondary | ICD-10-CM | POA: Diagnosis not present

## 2020-04-09 DIAGNOSIS — M79671 Pain in right foot: Secondary | ICD-10-CM

## 2020-04-09 DIAGNOSIS — I739 Peripheral vascular disease, unspecified: Secondary | ICD-10-CM | POA: Diagnosis not present

## 2020-04-09 DIAGNOSIS — IMO0002 Reserved for concepts with insufficient information to code with codable children: Secondary | ICD-10-CM

## 2020-04-09 MED ORDER — DICLOFENAC SODIUM 1 % EX GEL: 4.0000 g | Freq: Four times a day (QID) | CUTANEOUS | 2 refills | Status: DC

## 2020-04-09 MED ORDER — DICLOFENAC SODIUM 75 MG PO TBEC
75.0000 mg | DELAYED_RELEASE_TABLET | Freq: Two times a day (BID) | ORAL | 0 refills | Status: DC
Start: 2020-04-09 — End: 2020-08-31

## 2020-04-09 MED FILL — DICLOFENAC SODIUM 1 % GEL: 1 | 13 days supply | Qty: 200 | Fill #0

## 2020-04-09 MED FILL — DICLOFENAC SODIUM 75 MG TAB: 75 | 15 days supply | Qty: 30 | Fill #0

## 2020-04-09 NOTE — Progress Notes (Signed)
Subjective: Deborah [name] is a 49 y.o. female patient who returns to office for f/u evaluation of bilateral foot pain, reports that she has pain in both feet but is most concerned about the swelling.  Reports that diclofenac and compression sleeves are helpful.  Patient also reports that she is due to return to work. No other issues noted.  FBS not recorded today  Last A1c not recorded.   Patient Active Problem List   Diagnosis Date Noted   Uncontrolled type 2 diabetes mellitus with diabetic neuropathy, without long-term current use of insulin ([place]) 10/05/2015   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   MUSCULOSKELETAL PAIN 10/01/2009   DYSLIPIDEMIA 12/05/2008   FATTY LIVER DISEASE 10/20/2008   NUMMULAR ECZEMA 05/22/2008   FEMALE INFERTILITY 05/01/2007   ANEMIA, IRON DEFICIENCY 04/03/2007   SMOKER 11/28/2006   HYPERTENSION, MILD 11/28/2006   HEMORRHOIDS, INTERNAL W/O COMPLICATION 82/42/3536   RHINITIS, ALLERGIC, DUE TO POLLEN 11/28/2006   GERD 11/28/2006   DERMATITIS, ATOPIC 11/28/2006   COCAINE ABUSE, HX OF 11/28/2006   LIVER FUNCTION TESTS, ABNORMAL 09/14/2006   GOUT 06/25/2004   MORTON'S NEUROMA, 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    Current Outpatient Medications on File Prior to Visit  Medication Sig Dispense Refill   allopurinol (ZYLOPRIM) 100 MG tablet Take 1 tablet (100 mg total) by mouth daily. 30 tablet 11   amitriptyline (ELAVIL) 150 MG tablet Take 0.5 tablets (75 mg total) by mouth at bedtime. 30 tablet 2   amLODipine (NORVASC) 10 MG tablet Take 1 tablet (10 mg total) by mouth daily. 30 tablet 11   blood glucose meter kit and supplies Dispense based on patient and insurance preference. Use up to four times daily as directed. (FOR ICD-10 E10.9, E11.9). 1 each 0   fluticasone (FLONASE) 50 MCG/ACT nasal spray Place 2 sprays into both nostrils daily. 16 g 12   glipiZIDE (GLUCOTROL XL) 5 MG 24 hr tablet Take 1 tablet (5 mg total) by mouth daily with breakfast. 30 tablet 11    HYDROcodone-acetaminophen (NORCO) 7.5-325 MG tablet Take 1 tablet by mouth every 6 (six) hours as needed for moderate pain.     indomethacin (INDOCIN) 50 MG capsule Take 1 capsule (50 mg total) by mouth 2 (two) times daily. Use as needed for gout flare 60 capsule 3   lisinopril (ZESTRIL) 10 MG tablet Take 1 tablet (10 mg total) by mouth daily. 30 tablet 11   loratadine (CLARITIN) 10 MG tablet Take 1 tablet (10 mg total) by mouth daily. 100 tablet 3   meloxicam (MOBIC) 15 MG tablet Take 1 tablet (15 mg total) by mouth daily. 30 tablet 0   metFORMIN (GLUCOPHAGE) 1000 MG tablet Take 1 tablet (1,000 mg total) by mouth 2 (two) times daily. 60 tablet 11   SUMAtriptan (IMITREX) 50 MG tablet Take 1 dose in case of headache. May repeat in 2 hours if headache persists or recurs.  Max 200 mg per 24 hours 10 tablet 3   triamcinolone cream (KENALOG) 0.1 % Apply 1 application topically 2 (two) times daily. Use as needed for eczema 80 g 2   Wheat Dextrin (BENEFIBER) POWD Use 1 tablespoon three times a day with meals  0   No current facility-administered medications on file prior to visit.    Allergies  Allergen Reactions   Cephalexin     REACTION: stomach cramps and hives   Hydrocodone     headache   Latex    Neomycin-Bacitracin Zn-Polymyx     REACTION: burning   Sertraline Hives  and Itching   Adhesive [Tape] Hives and Rash    Objective:  General: Alert and oriented x3 in no acute distress  Dermatology: No open lesions bilateral lower extremities, no webspace macerations, no ecchymosis bilateral, all nails x 10 are well manicured.  Vascular: Dorsalis Pedis and Posterior Tibial pedal pulses palpable, Capillary Fill Time 3 seconds,(+) pedal hair growth bilateral, trace edema bilateral lower extremities like previous per patient that gets worse, Temperature gradient within normal limits.  Neurology: Gross sensation intact via light touch bilateral.  Subjective numbness tingling and  burning s better per patient.  Musculoskeletal: Minimal tenderness with palpation at medial calcaneal tubercle and extending into the arch bilateral, right greater than left.  There is midtarsal breech supportive of pes planus.  Limited 1st MTPJ and ankle range of motion bilateral.  Strength within normal limits in all groups bilateral.   Assessment and Plan: Problem List Items Addressed This Visit      Endocrine   Uncontrolled type 2 diabetes mellitus with diabetic neuropathy, without long-term current use of insulin ([place])    Other Visit Diagnoses    Swelling of both lower extremities    -  Primary   Plantar fasciitis, bilateral       PVD (peripheral vascular disease) (HCC)       Pain in both feet          -Complete examination performed -Discussed treatment options for continued pains and swelling -Previous duplex studies reviewed -Advised get 12-15 mmHg compression stockings -Continue with diclofenac PRN -Rx topical voltaren to use as directed -Recommend daily stretching and good supportive shoes daily for foot type -Work excuse given patient to resume work on 11/18 maximum of 10.5 hours per shift due to foot pain -Patient to return to office as needed  or sooner if condition worsens.  Landis Martins, DPM

## 2020-04-09 NOTE — Patient Instructions (Signed)
12-58mmHg compression stockings for swelling

## 2020-04-16 ENCOUNTER — Other Ambulatory Visit (HOSPITAL_COMMUNITY)
Admission: RE | Admit: 2020-04-16 | Discharge: 2020-04-16 | Disposition: A | Discharge status: Home / Self Care | Payer: Managed Care, Other (non HMO) | Source: Ambulatory Visit | Attending: Thoracic Diseases | Admitting: Thoracic Diseases

## 2020-04-16 ENCOUNTER — Other Ambulatory Visit (HOSPITAL_COMMUNITY): Payer: Managed Care, Other (non HMO)

## 2020-04-16 DIAGNOSIS — Z20822 Contact with and (suspected) exposure to covid-19: Secondary | ICD-10-CM | POA: Insufficient documentation

## 2020-04-16 DIAGNOSIS — Z01812 Encounter for preprocedural laboratory examination: Secondary | ICD-10-CM | POA: Insufficient documentation

## 2020-04-16 LAB — SARS CORONAVIRUS 2 (TAT 6-24 HRS): SARS Coronavirus 2: NEGATIVE

## 2020-04-20 ENCOUNTER — Ambulatory Visit (HOSPITAL_COMMUNITY)
Admission: RE | Admit: 2020-04-20 | Discharge: 2020-04-20 | Disposition: A | Discharge status: Home / Self Care | Payer: Managed Care, Other (non HMO) | Attending: Surgery | Admitting: Surgery

## 2020-04-20 ENCOUNTER — Encounter (HOSPITAL_COMMUNITY)
Admission: RE | Disposition: A | Discharge status: Home / Self Care | Payer: Self-pay | Source: Home / Self Care | Attending: Surgery

## 2020-04-20 DIAGNOSIS — R159 Full incontinence of feces: Secondary | ICD-10-CM | POA: Diagnosis not present

## 2020-04-20 HISTORY — PX: ANAL RECTAL MANOMETRY: SHX6358

## 2020-04-20 SURGERY — MANOMETRY, ANORECTAL

## 2020-04-20 NOTE — Progress Notes (Signed)
Anal rectal manometry performed per protocol without complications.  Patient tolerated well.  Balloon expulsion test performed per protocol without complications.  Patient tolerated well.  Expelled balloon after 2 minutes and 5 seconds.

## 2020-04-21 MED FILL — METFORMIN HCL 1000 MG TABS: 1000 | 30 days supply | Qty: 60 | Fill #0

## 2020-04-21 MED FILL — ALLOPURINOL 100 MG TABS: 100 | 30 days supply | Qty: 30 | Fill #0

## 2020-04-21 MED FILL — glipiZIDE ER 5 MG TB24: 5 | 30 days supply | Qty: 30 | Fill #1

## 2020-04-21 MED FILL — AMLODIPINE BESYLATE 10 MG T: 10 | 30 days supply | Qty: 30 | Fill #0

## 2020-04-22 ENCOUNTER — Encounter (HOSPITAL_COMMUNITY): Payer: Self-pay | Admitting: Surgery

## 2020-04-27 MED FILL — LISINOPRIL 10 MG TABS: 10 | 30 days supply | Qty: 30 | Fill #1

## 2020-05-22 MED FILL — ALLOPURINOL 100 MG TABS: 100 | 30 days supply | Qty: 30 | Fill #1

## 2020-05-22 MED FILL — METFORMIN HCL 1000 MG TABS: 1000 | 30 days supply | Qty: 60 | Fill #1

## 2020-05-22 MED FILL — glipiZIDE ER 5 MG TB24: 5 | 30 days supply | Qty: 30 | Fill #2

## 2020-05-22 MED FILL — AMLODIPINE BESYLATE 10 MG T: 10 | 30 days supply | Qty: 30 | Fill #1

## 2020-05-22 MED FILL — LISINOPRIL 10 MG TABS: 10 | 30 days supply | Qty: 30 | Fill #2

## 2020-06-16 ENCOUNTER — Encounter: Payer: Self-pay | Admitting: Family Medicine

## 2020-06-24 MED FILL — LISINOPRIL 10 MG TABS: 10 | 30 days supply | Qty: 30 | Fill #3

## 2020-06-24 MED FILL — AMLODIPINE BESYLATE 10 MG T: 10 | 30 days supply | Qty: 30 | Fill #2

## 2020-06-24 MED FILL — ALLOPURINOL 100 MG TABS: 100 | 30 days supply | Qty: 30 | Fill #2

## 2020-06-24 MED FILL — glipiZIDE ER 5 MG TB24: 5 | 30 days supply | Qty: 30 | Fill #3

## 2020-06-24 MED FILL — METFORMIN HCL 1000 MG TABS: 1000 | 30 days supply | Qty: 60 | Fill #2

## 2020-06-24 MED FILL — LORATADINE 10 MG TABS: 10 | 90 days supply | Qty: 90 | Fill #1

## 2020-06-26 ENCOUNTER — Other Ambulatory Visit (HOSPITAL_BASED_OUTPATIENT_CLINIC_OR_DEPARTMENT_OTHER): Payer: Self-pay | Admitting: Family Medicine

## 2020-06-26 DIAGNOSIS — Z1231 Encounter for screening mammogram for malignant neoplasm of breast: Secondary | ICD-10-CM

## 2020-06-28 ENCOUNTER — Encounter: Payer: Self-pay | Admitting: Family Medicine

## 2020-07-02 ENCOUNTER — Other Ambulatory Visit: Payer: Self-pay | Admitting: Family Medicine

## 2020-07-02 MED ORDER — PROMETHAZINE-DM 6.25-15 MG/5ML PO SYRP: 5.0000 mL | ORAL_SOLUTION | Freq: Four times a day (QID) | ORAL | 0 refills | Status: DC | PRN

## 2020-07-02 MED FILL — PROMETHAZINE W/DM SYRUP: 6.25-15 | 6 days supply | Qty: 118 | Fill #0

## 2020-07-12 ENCOUNTER — Encounter: Payer: Self-pay | Admitting: Family Medicine

## 2020-07-13 ENCOUNTER — Other Ambulatory Visit: Payer: Managed Care, Other (non HMO)

## 2020-07-13 ENCOUNTER — Other Ambulatory Visit: Payer: Self-pay

## 2020-07-13 DIAGNOSIS — Z20822 Contact with and (suspected) exposure to covid-19: Secondary | ICD-10-CM

## 2020-07-14 LAB — NOVEL CORONAVIRUS, NAA: SARS-CoV-2, NAA: NOT DETECTED

## 2020-07-14 LAB — SARS-COV-2, NAA 2 DAY TAT

## 2020-07-28 MED FILL — LISINOPRIL 10 MG TABS: 10 | 30 days supply | Qty: 30 | Fill #4

## 2020-07-28 MED FILL — glipiZIDE ER 5 MG TB24: 5 | 30 days supply | Qty: 30 | Fill #4

## 2020-07-28 MED FILL — AMLODIPINE BESYLATE 10 MG T: 10 | 30 days supply | Qty: 30 | Fill #3

## 2020-07-28 MED FILL — METFORMIN HCL 1000 MG TABS: 1000 | 30 days supply | Qty: 60 | Fill #3

## 2020-07-28 MED FILL — ALLOPURINOL 100 MG TABS: 100 | 30 days supply | Qty: 30 | Fill #3

## 2020-08-10 ENCOUNTER — Ambulatory Visit (HOSPITAL_BASED_OUTPATIENT_CLINIC_OR_DEPARTMENT_OTHER): Payer: Managed Care, Other (non HMO)

## 2020-08-17 ENCOUNTER — Other Ambulatory Visit: Payer: Self-pay

## 2020-08-17 ENCOUNTER — Ambulatory Visit (HOSPITAL_BASED_OUTPATIENT_CLINIC_OR_DEPARTMENT_OTHER)
Admission: RE | Admit: 2020-08-17 | Discharge: 2020-08-17 | Disposition: A | Discharge status: Home / Self Care | Payer: Managed Care, Other (non HMO) | Source: Ambulatory Visit | Attending: Family Medicine | Admitting: Family Medicine

## 2020-08-17 ENCOUNTER — Encounter (HOSPITAL_BASED_OUTPATIENT_CLINIC_OR_DEPARTMENT_OTHER): Payer: Self-pay

## 2020-08-17 DIAGNOSIS — Z1231 Encounter for screening mammogram for malignant neoplasm of breast: Secondary | ICD-10-CM | POA: Insufficient documentation

## 2020-08-24 MED FILL — METFORMIN HCL 1000 MG TABS: 1000 | 30 days supply | Qty: 60 | Fill #4

## 2020-08-24 MED FILL — AMLODIPINE BESYLATE 10 MG T: 10 | 30 days supply | Qty: 30 | Fill #4

## 2020-08-24 MED FILL — ALLOPURINOL 100 MG TABS: 100 | 30 days supply | Qty: 30 | Fill #4

## 2020-08-24 MED FILL — glipiZIDE ER 5 MG TB24: 5 | 30 days supply | Qty: 30 | Fill #5

## 2020-08-24 MED FILL — LISINOPRIL 10 MG TABLET: 10 | 30 days supply | Qty: 30 | Fill #5

## 2020-08-28 NOTE — Progress Notes (Addendum)
[facility] [place] [address], [address] [place], [place] [address] [phone] Fax [phone]  Date:  08/31/2020   Name:  Deborah [name]   DOB:  08/14/1970   MRN:  [id number]  PCP:  Copland, Jessica C, MD    Chief Complaint: Hyperlipidemia (6 month follow up/) and burning in feet ([place], swelling in ankles, wearing diabetic shoes and compression socks, would like letter to stay on 10.5 hours at work)   History of Present Illness:  Deborah [name] is a 50 y.o. very pleasant female patient who presents with the following:  Here today for 6-month follow-up visit- history of diabetes, hypertension, fatty liver, gout Last seen by myself in September; time she was suffering from fecal incontinence and was working with [facility] for this issue.  She also was using FMLA due to chronic foot pain due to plantar fasciitis.  She was thinking about short-term disability Right now she is working, her shifts are limited to 10.5 hours which she is able to tolerate  She continues to have some burning and swelling in her feet-this is worse if she spends a lot of time on her feet She is wearing compression stockings and socks as well as "diabeteic shoes" and this does help Standing for long periods of time causes her pain and tingling in her toes  She would like to stay on a 10.5 hour shift length which she is doing now  She does use voltaren as needed and this helps her   We have exchanged a few notes recently, she had COVID-19 in January  Foot exam due- update today  A1c due COVID-19 booster- she is getting today  Shingrix- defer as she is getting covid booster   She will try generic zyrtec for her allergies   Patient Active Problem List   Diagnosis Date Noted  . Uncontrolled type 2 diabetes mellitus with diabetic neuropathy, without long-term current use of insulin (HCC) 10/05/2015  . 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 MUSCULOSKELETAL PAIN  10/01/2009  . DYSLIPIDEMIA 12/05/2008  . FATTY LIVER DISEASE 10/20/2008  . NUMMULAR ECZEMA 05/22/2008  . FEMALE INFERTILITY 05/01/2007  . ANEMIA, IRON DEFICIENCY 04/03/2007  . SMOKER 11/28/2006  . HYPERTENSION, MILD 11/28/2006  . HEMORRHOIDS, INTERNAL W/O COMPLICATION 11/28/2006  . RHINITIS, ALLERGIC, DUE TO POLLEN 11/28/2006  . GERD 11/28/2006  . DERMATITIS, ATOPIC 11/28/2006  . COCAINE ABUSE, HX OF 11/28/2006  . LIVER FUNCTION TESTS, ABNORMAL 09/14/2006  . GOUT 06/25/2004  . 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 Allergy   . Anemia   . Arthritis   . Diabetes mellitus   . Eczema   . GERD (gastroesophageal reflux disease)   . Gout   . Hyperlipidemia    no meds  . Hypertension   . Neuromuscular disorder (HCC)   . Plantar fasciitis   . Polysubstance abuse (HCC)    ETOH and Cocaine  . Seasonal allergies     Past Surgical History:  Procedure Laterality Date  . ANAL RECTAL MANOMETRY N/A 04/20/2020   Procedure: ANO RECTAL MANOMETRY;  Surgeon: White, Christopher M, MD;  Location: [facility];  Service: General;  Laterality: N/A;  . HEMORRHOID SURGERY      Social History   Tobacco Use  . Smoking status: Former Smoker    Packs/day: 0.50    Quit date: 04/24/2013    Years since quitting: 7.3  . Smokeless tobacco: Former   User    Quit date: 03/14/2013  Vaping Use  . Vaping Use: Never used  Substance Use Topics  . Alcohol use: No    Comment: none since 03-14-13  . Drug use: Not Currently    Types: Marijuana, Cocaine    Comment: Quit 04/14/2013 per pt.     Family History  Problem Relation Age of Onset  . Hypertension Father   . Diabetes Father   . Cancer Father   . Hypertension Other   . Diabetes Other   . Diabetes Mother   . Hypertension Mother   . Diabetes Maternal Grandmother   . Hypertension Maternal Grandmother   . Diabetes Paternal Grandfather   . Colon cancer Neg Hx   . Colon polyps Neg Hx   . Esophageal  cancer Neg Hx   . Rectal cancer Neg Hx   . Stomach cancer Neg Hx     Allergies  Allergen Reactions  . Cephalexin     REACTION: stomach cramps and hives  . Hydrocodone     headache  . Latex   . Neomycin-Bacitracin Zn-Polymyx     REACTION: burning  . Sertraline Hives and Itching  . Adhesive [Tape] Hives and Rash    Medication list has been reviewed and updated.  Current Outpatient Medications on File Prior to Visit  Medication Sig Dispense Refill  . allopurinol (ZYLOPRIM) 100 MG tablet Take 1 tablet (100 mg total) by mouth daily. 30 tablet 11  . amLODipine (NORVASC) 10 MG tablet Take 1 tablet (10 mg total) by mouth daily. 30 tablet 11  . blood glucose meter kit and supplies Dispense based on patient and insurance preference. Use up to four times daily as directed. (FOR ICD-10 E10.9, E11.9). 1 each 0  . diclofenac Sodium (VOLTAREN) 1 % GEL Apply 4 g topically 4 (four) times daily. For foot pains 150 g 2  . glipiZIDE (GLUCOTROL XL) 5 MG 24 hr tablet Take 1 tablet (5 mg total) by mouth daily with breakfast. 30 tablet 11  . indomethacin (INDOCIN) 50 MG capsule Take 1 capsule (50 mg total) by mouth 2 (two) times daily. Use as needed for gout flare 60 capsule 3  . lisinopril (ZESTRIL) 10 MG tablet Take 1 tablet (10 mg total) by mouth daily. 30 tablet 11  . loratadine (CLARITIN) 10 MG tablet Take 1 tablet (10 mg total) by mouth daily. 100 tablet 3  . metFORMIN (GLUCOPHAGE) 1000 MG tablet Take 1 tablet (1,000 mg total) by mouth 2 (two) times daily. 60 tablet 11  . SUMAtriptan (IMITREX) 50 MG tablet Take 1 dose in case of headache. May repeat in 2 hours if headache persists or recurs.  Max 200 mg per 24 hours 10 tablet 3  . triamcinolone cream (KENALOG) 0.1 % Apply 1 application topically 2 (two) times daily. Use as needed for eczema 80 g 2  . [place] Dextrin (BENEFIBER) POWD Use 1 tablespoon three times a day with meals  0  . amitriptyline (ELAVIL) 150 MG tablet Take 0.5 tablets (75 mg total)  by mouth at bedtime. (Patient not taking: Reported on 08/31/2020) 30 tablet 2   No current facility-administered medications on file prior to visit.    Review of Systems:  As per HPI- otherwise negative.   Physical Examination: Vitals:   08/31/20 0912 08/31/20 0925  BP: (!) 146/98 138/80  Pulse: 89   Resp: 18   Temp: 98.6 F (37 C)   SpO2: 99%    Vitals:   08/31/20 0912  Weight: 181 lb (82.1 kg)  Height: 4' 11" (1.499 m)   Body mass index is 36.56 kg/m. Ideal Body Weight: Weight in (lb) to have BMI = 25: 123.5  GEN: no acute distress.  Obese, otherwise looks well HEENT: Atraumatic, Normocephalic.  Ears and Nose: No external deformity. CV: RRR, No M/G/R. No JVD. No thrill. No extra heart sounds. PULM: CTA B, no wheezes, crackles, rhonchi. No retractions. No resp. distress. No accessory muscle use. ABD: S, NT, ND, +BS. No rebound. No HSM. EXTR: No c/c/e PSYCH: Normally interactive. Conversant.  Foot exam- normal sensation and pulses Tight achilles   Assessment and Plan: Controlled type 2 diabetes mellitus without complication, without long-term current use of insulin (HCC) - Plan: Hemoglobin A1c  Mixed hyperlipidemia  Essential hypertension  Idiopathic chronic gout of multiple sites without tophus  Other allergic rhinitis - Plan: Cetirizine HCl 10 MG CAPS  Chronic foot pain, unspecified laterality - Plan: diclofenac (VOLTAREN) 75 MG EC tablet  Following up today.  Await A1c to follow-up on diabetes Blood pressure under good control She has not had gout symptoms recently Allergies continue to bother her.  She is using loratadine but symptoms are incompletely controlled.  Nasal steroid sprays are problematic because of cost.  We will have her try oral Zyrtec and see if this works any better Refilled her Voltaren to use as needed for foot pain Will plan further follow- up pending labs.   This visit occurred during the SARS-CoV-2 public health emergency.  Safety  protocols were in place, including screening questions prior to the visit, additional usage of staff PPE, and extensive cleaning of exam room while observing appropriate contact time as indicated for disinfecting solutions.    Signed Jessica Copland, MD  Received her A1c as below, message to patient  Results for orders placed or performed in visit on 08/31/20  Hemoglobin A1c  Result Value Ref Range   Hgb A1c MFr Bld 7.0 (H) 4.6 - 6.5 %     

## 2020-08-28 NOTE — Patient Instructions (Addendum)
It was very nice to see you again today, I will be in touch your labs as soon as possible.  Assuming all is well, please see me in about 6 months  We can start your shingles vaccine series the next time we see you since you are getting your covid booster today  Continue to use voltaren as needed for pain- take this OR indomethacin, not both in the same day We will have you try generic zyrtec (certrizine) as needed for your allergies

## 2020-08-31 ENCOUNTER — Other Ambulatory Visit: Payer: Self-pay

## 2020-08-31 ENCOUNTER — Encounter: Payer: Self-pay | Admitting: Family Medicine

## 2020-08-31 ENCOUNTER — Other Ambulatory Visit (HOSPITAL_BASED_OUTPATIENT_CLINIC_OR_DEPARTMENT_OTHER): Payer: Self-pay | Admitting: Internal Medicine

## 2020-08-31 ENCOUNTER — Ambulatory Visit: Payer: Managed Care, Other (non HMO) | Attending: Internal Medicine

## 2020-08-31 ENCOUNTER — Ambulatory Visit: Payer: Managed Care, Other (non HMO) | Admitting: Family Medicine

## 2020-08-31 VITALS — BP 138/80 | HR 89 | Temp 98.6°F | Resp 18 | Ht 59.0 in | Wt 181.0 lb

## 2020-08-31 DIAGNOSIS — M1A09X0 Idiopathic chronic gout, multiple sites, without tophus (tophi): Secondary | ICD-10-CM

## 2020-08-31 DIAGNOSIS — G8929 Other chronic pain: Secondary | ICD-10-CM

## 2020-08-31 DIAGNOSIS — M79673 Pain in unspecified foot: Secondary | ICD-10-CM

## 2020-08-31 DIAGNOSIS — E119 Type 2 diabetes mellitus without complications: Secondary | ICD-10-CM

## 2020-08-31 DIAGNOSIS — I10 Essential (primary) hypertension: Secondary | ICD-10-CM

## 2020-08-31 DIAGNOSIS — E782 Mixed hyperlipidemia: Secondary | ICD-10-CM | POA: Diagnosis not present

## 2020-08-31 DIAGNOSIS — J3089 Other allergic rhinitis: Secondary | ICD-10-CM

## 2020-08-31 DIAGNOSIS — Z23 Encounter for immunization: Secondary | ICD-10-CM

## 2020-08-31 LAB — HEMOGLOBIN A1C: Hgb A1c MFr Bld: 7 % — ABNORMAL HIGH (ref 4.6–6.5)

## 2020-08-31 MED ORDER — CETIRIZINE HCL 10 MG PO CAPS: ORAL_CAPSULE | ORAL | 3 refills | Status: DC

## 2020-08-31 MED ORDER — DICLOFENAC SODIUM 75 MG PO TBEC: 75.0000 mg | DELAYED_RELEASE_TABLET | Freq: Two times a day (BID) | ORAL | 3 refills | Status: DC

## 2020-08-31 MED FILL — DICLOFENAC SODIUM 75 MG TAB: 75 | 15 days supply | Qty: 30 | Fill #0

## 2020-09-01 MED FILL — PFIZER-BIONT COVID-19 VAC-T: 30 | 21 days supply | Qty: 0 | Fill #0

## 2020-09-01 MED FILL — CETIRIZINE HCL 10 MG TABS: 10 | 100 days supply | Qty: 100 | Fill #0

## 2020-09-28 MED FILL — Amlodipine Besylate Tab 10 MG (Base Equivalent): ORAL | 30 days supply | Qty: 30 | Fill #0 | Status: AC

## 2020-09-28 MED FILL — Metformin HCl Tab 1000 MG: ORAL | 30 days supply | Qty: 60 | Fill #0 | Status: AC

## 2020-09-28 MED FILL — Lisinopril Tab 10 MG: ORAL | 30 days supply | Qty: 30 | Fill #0 | Status: AC

## 2020-09-28 MED FILL — Allopurinol Tab 100 MG: ORAL | 30 days supply | Qty: 30 | Fill #0 | Status: AC

## 2020-09-28 MED FILL — Glipizide Tab ER 24HR 5 MG: ORAL | 90 days supply | Qty: 90 | Fill #0 | Status: AC

## 2020-09-29 ENCOUNTER — Other Ambulatory Visit (HOSPITAL_BASED_OUTPATIENT_CLINIC_OR_DEPARTMENT_OTHER): Payer: Self-pay

## 2020-10-21 ENCOUNTER — Encounter: Payer: Self-pay | Admitting: Sports Medicine

## 2020-10-25 MED FILL — Allopurinol Tab 100 MG: ORAL | 30 days supply | Qty: 30 | Fill #1 | Status: AC

## 2020-10-25 MED FILL — Metformin HCl Tab 1000 MG: ORAL | 30 days supply | Qty: 60 | Fill #1 | Status: AC

## 2020-10-25 MED FILL — Amlodipine Besylate Tab 10 MG (Base Equivalent): ORAL | 30 days supply | Qty: 30 | Fill #1 | Status: AC

## 2020-10-25 MED FILL — Lisinopril Tab 10 MG: ORAL | 30 days supply | Qty: 30 | Fill #1 | Status: AC

## 2020-10-26 ENCOUNTER — Other Ambulatory Visit (HOSPITAL_BASED_OUTPATIENT_CLINIC_OR_DEPARTMENT_OTHER): Payer: Self-pay

## 2020-11-06 ENCOUNTER — Telehealth: Payer: Self-pay | Admitting: Sports Medicine

## 2020-11-06 NOTE — Telephone Encounter (Signed)
Per [place] [employer] insurance the diabetic shoe/inserts codes(A5500/A5514) are valid and billable and no Josem Kaufmann is required. Covered @ 80% after 1800.00 deductible(Met 367.47) out of pocket is 5500.00(met 570.05) 1 pr per yr reference # [id number].Marland Kitchen

## 2020-11-06 NOTE — Telephone Encounter (Signed)
Contacted pt with benefits for her diabetic shoes and explained that they are covered @ 80% after 1800.00 deductible(met 367.47) and explained that the total cost of 604.00 would be her responsibility since she has not met her deductible. She asked me how can she meet her deductible. I told her it is paid when you have doctors visits and things like that but she has well over 1000.00 to meet her deductible. She wants to go ahead with the shoes and is scheduled to see [place] 6.6

## 2020-11-09 ENCOUNTER — Ambulatory Visit (INDEPENDENT_AMBULATORY_CARE_PROVIDER_SITE_OTHER): Payer: Managed Care, Other (non HMO) | Admitting: Sports Medicine

## 2020-11-09 ENCOUNTER — Encounter: Payer: Self-pay | Admitting: Sports Medicine

## 2020-11-09 ENCOUNTER — Other Ambulatory Visit: Payer: Self-pay

## 2020-11-09 DIAGNOSIS — M2021 Hallux rigidus, right foot: Secondary | ICD-10-CM

## 2020-11-09 DIAGNOSIS — M2142 Flat foot [pes planus] (acquired), left foot: Secondary | ICD-10-CM

## 2020-11-09 DIAGNOSIS — M2141 Flat foot [pes planus] (acquired), right foot: Secondary | ICD-10-CM

## 2020-11-09 DIAGNOSIS — M722 Plantar fascial fibromatosis: Secondary | ICD-10-CM

## 2020-11-09 DIAGNOSIS — E1140 Type 2 diabetes mellitus with diabetic neuropathy, unspecified: Secondary | ICD-10-CM

## 2020-11-09 DIAGNOSIS — IMO0002 Reserved for concepts with insufficient information to code with codable children: Secondary | ICD-10-CM

## 2020-11-09 DIAGNOSIS — M79671 Pain in right foot: Secondary | ICD-10-CM

## 2020-11-09 DIAGNOSIS — E1165 Type 2 diabetes mellitus with hyperglycemia: Secondary | ICD-10-CM

## 2020-11-09 DIAGNOSIS — M2022 Hallux rigidus, left foot: Secondary | ICD-10-CM

## 2020-11-09 DIAGNOSIS — M79672 Pain in left foot: Secondary | ICD-10-CM

## 2020-11-09 NOTE — Progress Notes (Signed)
Patient discussed with medical assistant. Agree with her note. Patient to follow up as scheduled for continued care or sooner if problems or issues arise. -Dr. Cannon Kettle

## 2020-11-09 NOTE — Progress Notes (Signed)
Patient presented for foam casting for 3 pair custom diabetic shoe inserts. Patient is measured with a brannock device to be a size 7 medium  Diabetic shoes are chosen from the safe step catalog.  The shoes chosen are [id number]    The patient will be contacted when the shoes and inserts are ready to be picked up

## 2020-11-22 MED FILL — Lisinopril Tab 10 MG: ORAL | 30 days supply | Qty: 30 | Fill #2 | Status: AC

## 2020-11-22 MED FILL — Allopurinol Tab 100 MG: ORAL | 30 days supply | Qty: 30 | Fill #2 | Status: AC

## 2020-11-22 MED FILL — Metformin HCl Tab 1000 MG: ORAL | 30 days supply | Qty: 60 | Fill #2 | Status: AC

## 2020-11-22 MED FILL — Amlodipine Besylate Tab 10 MG (Base Equivalent): ORAL | 30 days supply | Qty: 30 | Fill #2 | Status: AC

## 2020-11-23 ENCOUNTER — Other Ambulatory Visit (HOSPITAL_BASED_OUTPATIENT_CLINIC_OR_DEPARTMENT_OTHER): Payer: Self-pay

## 2020-11-29 ENCOUNTER — Encounter: Payer: Self-pay | Admitting: Family Medicine

## 2020-12-01 ENCOUNTER — Encounter: Payer: Self-pay | Admitting: Family

## 2020-12-01 ENCOUNTER — Other Ambulatory Visit (HOSPITAL_BASED_OUTPATIENT_CLINIC_OR_DEPARTMENT_OTHER): Payer: Self-pay

## 2020-12-01 ENCOUNTER — Encounter: Payer: Self-pay | Admitting: Family Medicine

## 2020-12-01 ENCOUNTER — Ambulatory Visit (HOSPITAL_BASED_OUTPATIENT_CLINIC_OR_DEPARTMENT_OTHER)
Admission: RE | Admit: 2020-12-01 | Discharge: 2020-12-01 | Disposition: A | Discharge status: Home / Self Care | Payer: Managed Care, Other (non HMO) | Source: Ambulatory Visit | Attending: Family | Admitting: Family

## 2020-12-01 ENCOUNTER — Ambulatory Visit: Payer: Managed Care, Other (non HMO) | Admitting: Family

## 2020-12-01 ENCOUNTER — Other Ambulatory Visit: Payer: Self-pay

## 2020-12-01 ENCOUNTER — Other Ambulatory Visit: Payer: Self-pay | Admitting: Family

## 2020-12-01 VITALS — BP 152/80 | HR 83 | Temp 98.5°F | Ht 59.0 in | Wt 177.8 lb

## 2020-12-01 DIAGNOSIS — M545 Low back pain, unspecified: Secondary | ICD-10-CM | POA: Insufficient documentation

## 2020-12-01 DIAGNOSIS — I10 Essential (primary) hypertension: Secondary | ICD-10-CM

## 2020-12-01 MED ORDER — PREDNISONE 20 MG PO TABS
20.0000 mg | ORAL_TABLET | Freq: Every day | ORAL | 0 refills | Status: DC
  Filled 2020-12-01: qty 5, 5d supply, fill #0

## 2020-12-01 MED ORDER — CYCLOBENZAPRINE HCL 10 MG PO TABS
10.0000 mg | ORAL_TABLET | Freq: Three times a day (TID) | ORAL | 0 refills | Status: DC | PRN
  Filled 2020-12-01: qty 30, 10d supply, fill #0

## 2020-12-01 NOTE — Patient Instructions (Signed)

## 2020-12-01 NOTE — Progress Notes (Signed)
Deborah [name] is a 50 y.o. female with the following history as recorded in EpicCare:  Patient Active Problem List   Diagnosis Date Noted   Uncontrolled type 2 diabetes mellitus with diabetic neuropathy, without long-term current use of insulin ([place]) 10/05/2015   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   MUSCULOSKELETAL PAIN 10/01/2009   DYSLIPIDEMIA 12/05/2008   FATTY LIVER DISEASE 10/20/2008   NUMMULAR ECZEMA 05/22/2008   FEMALE INFERTILITY 05/01/2007   ANEMIA, IRON DEFICIENCY 04/03/2007   SMOKER 11/28/2006   HYPERTENSION, MILD 11/28/2006   HEMORRHOIDS, INTERNAL W/O COMPLICATION 17/00/1749   RHINITIS, ALLERGIC, DUE TO POLLEN 11/28/2006   GERD 11/28/2006   DERMATITIS, ATOPIC 11/28/2006   COCAINE ABUSE, HX OF 11/28/2006   LIVER FUNCTION TESTS, ABNORMAL 09/14/2006   GOUT 06/25/2004   MORTON'S NEUROMA, 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    Current Outpatient Medications  Medication Sig Dispense Refill   allopurinol (ZYLOPRIM) 100 MG tablet TAKE 1 TABLET BY MOUTH ONCE DAILY 30 tablet 11   amLODipine (NORVASC) 10 MG tablet TAKE 1 TABLET BY MOUTH ONCE DAILY 30 tablet 11   blood glucose meter kit and supplies Dispense based on patient and insurance preference. Use up to four times daily as directed. (FOR ICD-10 E10.9, E11.9). 1 each 0   cetirizine (ZYRTEC) 10 MG tablet TAKE 1 TABLET BY MOUTH ONCE DAILY AS NEEDED FOR ALLERGIES 100 tablet 3   Cetirizine HCl 10 MG CAPS Take one daily as needed for allergies 90 capsule 3   cyclobenzaprine (FLEXERIL) 10 MG tablet Take 1 tablet (10 mg total) by mouth 3 (three) times daily as needed for muscle spasms. 30 tablet 0   diclofenac (VOLTAREN) 75 MG EC tablet TAKE 1 TABLET (75 MG TOTAL) BY MOUTH 2 (TWO) TIMES DAILY. USE AS NEEDED 60 tablet 3   diclofenac Sodium (VOLTAREN) 1 % GEL APPLY 4 GRAMS ON TO THE SKIN FOUR TIMES DAILY FOR FOOT PAINS 200 g 2   glipiZIDE (GLUCOTROL XL) 5 MG 24 hr tablet TAKE 1 TABLET BY MOUTH ONCE DAILY WITH BREAKFAST 90 tablet 3   indomethacin (INDOCIN)  50 MG capsule Take 1 capsule (50 mg total) by mouth 2 (two) times daily. Use as needed for gout flare 60 capsule 3   lisinopril (ZESTRIL) 10 MG tablet TAKE 1 TABLET BY MOUTH ONCE DAILY 30 tablet 11   metFORMIN (GLUCOPHAGE) 1000 MG tablet TAKE 1 TABLET BY MOUTH TWICE DAILY 60 tablet 11   predniSONE (DELTASONE) 20 MG tablet Take 1 tablet (20 mg total) by mouth daily with breakfast. 5 tablet 0   SUMAtriptan (IMITREX) 50 MG tablet Take 1 dose in case of headache. May repeat in 2 hours if headache persists or recurs.  Max 200 mg per 24 hours 10 tablet 3   triamcinolone cream (KENALOG) 0.1 % Apply 1 application topically 2 (two) times daily. Use as needed for eczema 80 g 2   amitriptyline (ELAVIL) 150 MG tablet Take 0.5 tablets (75 mg total) by mouth at bedtime. (Patient not taking: Reported on 12/01/2020) 30 tablet 2   loratadine (CLARITIN) 10 MG tablet TAKE 1 TABLET BY MOUTH ONCE DAILY (Patient not taking: Reported on 12/01/2020) 90 tablet 3   Wheat Dextrin (BENEFIBER) POWD Use 1 tablespoon three times a day with meals (Patient not taking: Reported on 12/01/2020)  0   No current facility-administered medications for this visit.    Allergies: Cephalexin, Hydrocodone, Latex, Neomycin-bacitracin zn-polymyx, Sertraline, and Adhesive [tape]  Past Medical History:  Diagnosis Date   Allergy    Anemia  Arthritis    Diabetes mellitus    Eczema    GERD (gastroesophageal reflux disease)    Gout    Hyperlipidemia    no meds   Hypertension    Neuromuscular disorder ([place])    Plantar fasciitis    Polysubstance abuse ([place])    ETOH and Cocaine   Seasonal allergies     Past Surgical History:  Procedure Laterality Date   ANAL RECTAL MANOMETRY N/A 04/20/2020   Procedure: ANO RECTAL MANOMETRY;  Surgeon: Ileana Roup, MD;  Location: [facility];  Service: General;  Laterality: N/A;   HEMORRHOID SURGERY      Family History  Problem Relation Age of Onset   Hypertension Father    Diabetes Father     Cancer Father    Hypertension Other    Diabetes Other    Diabetes Mother    Hypertension Mother    Diabetes Maternal Grandmother    Hypertension Maternal Grandmother    Diabetes Paternal Grandfather    Colon cancer Neg Hx    Colon polyps Neg Hx    Esophageal cancer Neg Hx    Rectal cancer Neg Hx    Stomach cancer Neg Hx     Social History   Tobacco Use   Smoking status: Former    Packs/day: 0.50    Pack years: 0.00    Types: Cigarettes    Quit date: 04/24/2013    Years since quitting: 7.6   Smokeless tobacco: Former    Quit date: 03/14/2013  Substance Use Topics   Alcohol use: No    Comment: none since 03-14-13    Subjective:   Patient presents with 3 week history of left sided low back/ hip pain; no known injury or trauma; does feel radiating symptoms down into left leg; no bowel or bladder changes; limited relief with Tylenol;  Most recent Hgba1c at 7;     Objective:  Vitals:   12/01/20 1118 12/01/20 1124  BP: (!) 150/90 (!) 152/80  Pulse: 83   Temp: 98.5 F (36.9 C)   SpO2: 99%   Weight: 177 lb 12.8 oz (80.6 kg)   Height: 4' 11"  (1.499 m)     General: Well developed, well nourished, in no acute distress  Skin : Warm and dry.  Head: Normocephalic and atraumatic  Lungs: Respirations unlabored;  Musculoskeletal: No deformities; no active joint inflammation  Extremities: No edema, cyanosis, clubbing  Vessels: Symmetric bilaterally  Neurologic: Alert and oriented; speech intact; face symmetrical; moves all extremities well; CNII-XII intact without focal deficit   Assessment:  1. Low back pain potentially associated with radiculopathy   2. Essential hypertension     Plan:   Suspect sciatica; update lumbar Xray; trial of Prednisone and Flexeril; follow up to be determined;  Suspect elevation secondary to pain; she will continue to monitor- goal is below 140/ 90;  This visit occurred during the SARS-CoV-2 public health emergency.  Safety protocols were in  place, including screening questions prior to the visit, additional usage of staff PPE, and extensive cleaning of exam room while observing appropriate contact time as indicated for disinfecting solutions.    No follow-ups on file.  Orders Placed This Encounter  Procedures   DG Lumbar Spine Complete    Standing Status:   Future    Number of Occurrences:   1    Standing Expiration Date:   12/01/2021    Order Specific Question:   Reason for Exam (SYMPTOM  OR DIAGNOSIS REQUIRED)  Answer:   low back pain with radiculopathy    Order Specific Question:   Is patient pregnant?    Answer:   No    Order Specific Question:   Preferred imaging location?    Answer:   [facility]    Requested Prescriptions   Signed Prescriptions Disp Refills   predniSONE (DELTASONE) 20 MG tablet 5 tablet 0    Sig: Take 1 tablet (20 mg total) by mouth daily with breakfast.   cyclobenzaprine (FLEXERIL) 10 MG tablet 30 tablet 0    Sig: Take 1 tablet (10 mg total) by mouth 3 (three) times daily as needed for muscle spasms.

## 2020-12-08 ENCOUNTER — Ambulatory Visit: Payer: Managed Care, Other (non HMO) | Attending: Internal Medicine

## 2020-12-08 DIAGNOSIS — Z23 Encounter for immunization: Secondary | ICD-10-CM

## 2020-12-08 NOTE — Progress Notes (Signed)
   Covid-19 Vaccination [facility]  Name:  CHIARA COLTRIN    MRN: [id number] DOB: 1971/01/23  12/08/2020  Ms. Flater was observed post Covid-19 immunization for 15 minutes without incident. She was provided with Vaccine Information Sheet and instruction to access the V-Safe system.   Ms. Stryker was instructed to call 911 with any severe reactions post vaccine: Difficulty breathing  Swelling of face and throat  A fast heartbeat  A bad rash all over body  Dizziness and weakness   Immunizations Administered     Name Date Dose VIS Date Route   PFIZER Comrnaty(Gray TOP) Covid-19 Vaccine 12/08/2020 10:29 AM 0.3 mL 05/14/2020 Intramuscular   Manufacturer: [place]   Lot: [id number]   [place]: [phone]

## 2020-12-11 ENCOUNTER — Other Ambulatory Visit (HOSPITAL_BASED_OUTPATIENT_CLINIC_OR_DEPARTMENT_OTHER): Payer: Self-pay

## 2020-12-11 MED ORDER — COVID-19 MRNA VAC-TRIS(PFIZER) 30 MCG/0.3ML IM SUSP
INTRAMUSCULAR | 0 refills | Status: DC
  Filled 2020-12-11: qty 0.3, 1d supply, fill #0

## 2020-12-21 ENCOUNTER — Telehealth: Payer: Self-pay | Admitting: Sports Medicine

## 2020-12-21 NOTE — Telephone Encounter (Signed)
Diabetic shoes/inserts in..lvm for pt to call to schedule an appt to pick them up. 

## 2020-12-22 MED FILL — Metformin HCl Tab 1000 MG: ORAL | 30 days supply | Qty: 60 | Fill #3 | Status: AC

## 2020-12-22 MED FILL — Amlodipine Besylate Tab 10 MG (Base Equivalent): ORAL | 30 days supply | Qty: 30 | Fill #3 | Status: AC

## 2020-12-22 MED FILL — Lisinopril Tab 10 MG: ORAL | 30 days supply | Qty: 30 | Fill #3 | Status: AC

## 2020-12-22 MED FILL — Allopurinol Tab 100 MG: ORAL | 30 days supply | Qty: 30 | Fill #3 | Status: AC

## 2020-12-22 MED FILL — Glipizide Tab ER 24HR 5 MG: ORAL | 90 days supply | Qty: 90 | Fill #1 | Status: AC

## 2020-12-23 ENCOUNTER — Other Ambulatory Visit (HOSPITAL_BASED_OUTPATIENT_CLINIC_OR_DEPARTMENT_OTHER): Payer: Self-pay

## 2021-01-18 ENCOUNTER — Telehealth: Payer: Self-pay | Admitting: Podiatry

## 2021-01-18 ENCOUNTER — Telehealth: Payer: Self-pay | Admitting: Sports Medicine

## 2021-01-18 NOTE — Telephone Encounter (Signed)
Pt called asking if her shoes were in so she could pick them up..   I returned call and left message that they are in and to call to schedule an appt to pick them up... I had left message on 7.18 for pt to call to schedule an appt as well.

## 2021-01-18 NOTE — Telephone Encounter (Signed)
Patient called and stated that Deborah [name] called her. She wanted to know if her shoes are ready for pick up

## 2021-01-20 ENCOUNTER — Other Ambulatory Visit: Payer: Self-pay | Admitting: Family Medicine

## 2021-01-20 MED ORDER — CYCLOBENZAPRINE HCL 10 MG PO TABS
10.0000 mg | ORAL_TABLET | Freq: Three times a day (TID) | ORAL | 0 refills | Status: DC | PRN
  Filled 2021-01-20: qty 30, 10d supply, fill #0

## 2021-01-20 NOTE — Telephone Encounter (Signed)
Medication: Rx #: [id number]  cyclobenzaprine (FLEXERIL) 10 MG tablet   Has the patient contacted their pharmacy? No. (If no, request that the patient contact the pharmacy for the refill.) (If yes, when and what did the pharmacy advise?)  Preferred Pharmacy (with phone number or street name): [facility]  [address] [place], [place] [address]  Phone:  [phone]  Fax:  [phone]  Agent: Please be advised that RX refills may take up to 3 business days. We ask that you follow-up with your pharmacy.

## 2021-01-20 NOTE — Telephone Encounter (Signed)
Patient requesting refill on medication. Please advise if appropriate.   Last visit 12/01/20 back pain with Jodi Mourning

## 2021-01-21 ENCOUNTER — Other Ambulatory Visit (HOSPITAL_BASED_OUTPATIENT_CLINIC_OR_DEPARTMENT_OTHER): Payer: Self-pay

## 2021-01-21 MED FILL — Cetirizine HCl Tab 10 MG: ORAL | 100 days supply | Qty: 100 | Fill #0 | Status: AC

## 2021-01-21 MED FILL — Metformin HCl Tab 1000 MG: ORAL | 30 days supply | Qty: 60 | Fill #4 | Status: AC

## 2021-01-21 MED FILL — Amlodipine Besylate Tab 10 MG (Base Equivalent): ORAL | 30 days supply | Qty: 30 | Fill #4 | Status: AC

## 2021-01-21 MED FILL — Lisinopril Tab 10 MG: ORAL | 30 days supply | Qty: 30 | Fill #4 | Status: AC

## 2021-01-21 MED FILL — Allopurinol Tab 100 MG: ORAL | 30 days supply | Qty: 30 | Fill #4 | Status: AC

## 2021-01-29 ENCOUNTER — Ambulatory Visit (INDEPENDENT_AMBULATORY_CARE_PROVIDER_SITE_OTHER): Payer: Managed Care, Other (non HMO)

## 2021-01-29 ENCOUNTER — Other Ambulatory Visit: Payer: Self-pay

## 2021-01-29 DIAGNOSIS — IMO0002 Reserved for concepts with insufficient information to code with codable children: Secondary | ICD-10-CM

## 2021-01-29 DIAGNOSIS — E1165 Type 2 diabetes mellitus with hyperglycemia: Secondary | ICD-10-CM

## 2021-01-29 DIAGNOSIS — E1140 Type 2 diabetes mellitus with diabetic neuropathy, unspecified: Secondary | ICD-10-CM

## 2021-01-29 NOTE — Progress Notes (Signed)
Patient in office today to pick-up diabetic shoes and custom inserts. Patient tried on the shoes with the custom inserts today and was not satisfied with fit of the shoe. Patient however was satisfied with the custom insert fit and feel. Patient tried on a size 7 medium in women's. Patient would like to order a 7.5 medium in women's for a more comfortable fit. Patient felt her hallux was too close to the front of the toe box. Advised patient that the shoes will be sent back and a new pair in the correct size would be ordered. Also advised the patient that the office will call when the new shoes are ready for pick-up.

## 2021-02-14 ENCOUNTER — Encounter: Payer: Self-pay | Admitting: Family Medicine

## 2021-02-18 ENCOUNTER — Telehealth: Payer: Self-pay | Admitting: Sports Medicine

## 2021-02-18 NOTE — Telephone Encounter (Signed)
Reordered diabetic shoes in... lvm for pt to call to schedule an appt to pick them up. 

## 2021-02-21 MED FILL — Metformin HCl Tab 1000 MG: ORAL | 30 days supply | Qty: 60 | Fill #5 | Status: AC

## 2021-02-21 MED FILL — Lisinopril Tab 10 MG: ORAL | 30 days supply | Qty: 30 | Fill #5 | Status: AC

## 2021-02-21 MED FILL — Allopurinol Tab 100 MG: ORAL | 30 days supply | Qty: 30 | Fill #5 | Status: AC

## 2021-02-21 MED FILL — Amlodipine Besylate Tab 10 MG (Base Equivalent): ORAL | 30 days supply | Qty: 30 | Fill #5 | Status: AC

## 2021-02-22 ENCOUNTER — Other Ambulatory Visit (HOSPITAL_BASED_OUTPATIENT_CLINIC_OR_DEPARTMENT_OTHER): Payer: Self-pay

## 2021-02-23 ENCOUNTER — Other Ambulatory Visit (HOSPITAL_BASED_OUTPATIENT_CLINIC_OR_DEPARTMENT_OTHER): Payer: Self-pay

## 2021-02-27 NOTE — Patient Instructions (Addendum)
It was good to see you again today, I will be in touch with your labs soon as possible You got your flu shot and first dose of shingrix today- 2nd dose can be done in 2-6 months Ok to do your covid booster at your convenience  We did a hip injection for trochanteric bursitis today- let me know how this works for you Ok to use voltaren/ diclofenac by mouth as needed for hip pain

## 2021-02-27 NOTE — Progress Notes (Addendum)
[place] at [facility] [address] [place], [place] [address] [phone]  Date:  03/03/2021   Name:  Deborah [name]   DOB:  04-27-1971   MRN:  [id number]  PCP:  Darreld Mclean, MD    Chief Complaint: 6 month follow up (Concerns/ questions: sharp pain in the left leg, Flexeril does not help. Sitting and at night is worse. /Flu shot today: yes and zoster/)   History of Present Illness:  Deborah [name] is a 50 y.o. very pleasant female patient who presents with the following:  Patient seen today for periodic follow-up- history of diabetes, hypertension, fatty liver, gout Most recent visit with myself was in March She has recently struggled with severe [place] fasciitis which limited her ability to work She recently contacted me as follows: Hello Dr. Lorelei Pont I have an appointment with you this month for my 6 month visit.  My left leg is still hurting on the with sharp pains from my hip to down the left side of my leg.  The Flexeril is not working.  It just makes me sleep.  Do you have something else I can try for the pain when I come in for my visit?  Thanks   She got a promotion and is no longer having to be on her feet as much- this is really helping with her body!  Her foot pain is much better However, she has noted pain in her left hip which is especially bothersome at night.  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   MUSCULOSKELETAL PAIN 10/01/2009    DYSLIPIDEMIA 12/05/2008   FATTY LIVER DISEASE 10/20/2008   NUMMULAR ECZEMA 05/22/2008   FEMALE INFERTILITY 05/01/2007   ANEMIA, IRON DEFICIENCY 04/03/2007   SMOKER 11/28/2006   HYPERTENSION, MILD 11/28/2006   HEMORRHOIDS, INTERNAL W/O COMPLICATION 78/24/2353   RHINITIS, ALLERGIC, DUE TO POLLEN 11/28/2006   GERD 11/28/2006   DERMATITIS, ATOPIC 11/28/2006   COCAINE ABUSE, HX OF 11/28/2006   LIVER FUNCTION TESTS, ABNORMAL 09/14/2006   GOUT 06/25/2004   MORTON'S NEUROMA, 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    Past Medical History:  Diagnosis Date   Allergy    Anemia    Arthritis    Diabetes mellitus    Eczema    GERD (gastroesophageal reflux disease)    Gout    Hyperlipidemia    no meds   Hypertension    Neuromuscular disorder ([place])    Plantar fasciitis    Polysubstance abuse ([place])    ETOH and Cocaine   Seasonal allergies     Past Surgical History:  Procedure Laterality Date   ANAL RECTAL MANOMETRY N/A 04/20/2020   Procedure: ANO RECTAL MANOMETRY;  Surgeon: Ileana Roup, MD;  Location: [facility];  Service: General;  Laterality: N/A;   HEMORRHOID SURGERY      Social History   Tobacco Use   Smoking status: Former    Packs/day: 0.50    Types: Cigarettes    Quit date:  04/24/2013    Years since quitting: 7.8   Smokeless tobacco: Former    Quit date: 03/14/2013  Vaping Use   Vaping Use: Never used  Substance Use Topics   Alcohol use: No    Comment: none since 03-14-13   Drug use: Not Currently    Types: Marijuana, Cocaine    Comment: Quit 04/14/2013 per pt.     Family History  Problem Relation Age of Onset   Hypertension Father    Diabetes Father    Cancer Father    Hypertension Other    Diabetes Other    Diabetes Mother    Hypertension Mother    Diabetes Maternal Grandmother    Hypertension Maternal Grandmother    Diabetes Paternal Grandfather    Colon cancer Neg Hx    Colon polyps Neg Hx    Esophageal cancer Neg Hx    Rectal cancer Neg Hx    Stomach cancer  Neg Hx     Allergies  Allergen Reactions   Cephalexin     REACTION: stomach cramps and hives   Hydrocodone     headache   Latex    Neomycin-Bacitracin Zn-Polymyx     REACTION: burning   Sertraline Hives and Itching   Adhesive [Tape] Hives and Rash    Medication list has been reviewed and updated.  Current Outpatient Medications on File Prior to Visit  Medication Sig Dispense Refill   allopurinol (ZYLOPRIM) 100 MG tablet TAKE 1 TABLET BY MOUTH ONCE DAILY 30 tablet 11   amLODipine (NORVASC) 10 MG tablet TAKE 1 TABLET BY MOUTH ONCE DAILY 30 tablet 11   blood glucose meter kit and supplies Dispense based on patient and insurance preference. Use up to four times daily as directed. (FOR ICD-10 E10.9, E11.9). 1 each 0   cetirizine (ZYRTEC) 10 MG tablet TAKE 1 TABLET BY MOUTH ONCE DAILY AS NEEDED FOR ALLERGIES 100 tablet 3   COVID-19 mRNA Vac-TriS, Pfizer, SUSP injection Inject into the muscle. 0.3 mL 0   diclofenac (VOLTAREN) 75 MG EC tablet TAKE 1 TABLET (75 MG TOTAL) BY MOUTH 2 (TWO) TIMES DAILY. USE AS NEEDED 60 tablet 3   diclofenac Sodium (VOLTAREN) 1 % GEL APPLY 4 GRAMS ON TO THE SKIN FOUR TIMES DAILY FOR FOOT PAINS 200 g 2   glipiZIDE (GLUCOTROL XL) 5 MG 24 hr tablet TAKE 1 TABLET BY MOUTH ONCE DAILY WITH BREAKFAST 90 tablet 3   indomethacin (INDOCIN) 50 MG capsule Take 1 capsule (50 mg total) by mouth 2 (two) times daily. Use as needed for gout flare 60 capsule 3   lisinopril (ZESTRIL) 10 MG tablet TAKE 1 TABLET BY MOUTH ONCE DAILY 30 tablet 11   metFORMIN (GLUCOPHAGE) 1000 MG tablet TAKE 1 TABLET BY MOUTH TWICE DAILY 60 tablet 11   Polyethylene Glycol 3350 (MIRALAX PO) Take by mouth.     No current facility-administered medications on file prior to visit.    Review of Systems:  As per HPI- otherwise negative.   Physical Examination: Vitals:   03/03/21 0852 03/03/21 0928  BP: (!) 144/84   Pulse: (!) 101 90  Resp: 18   Temp: 98.2 F (36.8 C)   SpO2: 98%    Vitals:    03/03/21 0852  Weight: 179 lb (81.2 kg)  Height: _0  (1.499 m)   Body mass index is 36.15 kg/m. Ideal Body Weight: Weight in (lb) to have BMI = 25: 123.5  GEN: no acute distress.  Obese, otherwise looks well HEENT: Atraumatic,  Normocephalic.  Ears and Nose: No external deformity. CV: RRR, No M/G/R. No JVD. No thrill. No extra heart sounds. PULM: CTA B, no wheezes, crackles, rhonchi. No retractions. No resp. distress. No accessory muscle use. ABD: S, NT, ND. No rebound. No HSM. EXTR: No c/c/e PSYCH: Normally interactive. Conversant.  Left hip: Tender over the greater trochanteric bursa.  Otherwise no pain with range of motion of the hip, normal bilateral lower extremity strength, sensation, deep tendon reflex.  No tenderness of her back Discussed injection for GTB, she would like to pursue this today  VC obtained-discussed risks and benefits of injection Site over left greater trochanteric bursa prepped with Betadine and alcohol.  Injected 7m depo-medrol, 452m1% plain lido  48m84mBS.  No immediate complications, patient tolerated procedure well.  She noted improvement in her symptoms right away Assessment and Plan: Mixed hyperlipidemia - Plan: Lipid panel  Idiopathic chronic gout of multiple sites without tophus  Essential hypertension - Plan: CBC, Comprehensive metabolic panel  Controlled type 2 diabetes mellitus without complication, without long-term current use of insulin (HCC) - Plan: Comprehensive metabolic panel, Hemoglobin A1c  Other allergic rhinitis  Chronic foot pain, unspecified laterality  Screening for thyroid disorder - Plan: TSH  Fatigue, unspecified type - Plan: TSH, VITAMIN D 25 Hydroxy (Vit-D Deficiency, Fractures)  Immunization due - Plan: Varicella-zoster vaccine IM (Shingrix)  Trochanteric bursitis, left hip - Plan: methylPREDNISolone acetate (DEPO-MEDROL) injection 40 mg  Need for influenza vaccination - Plan: Flu Vaccine QUAD 6+ mos PF IM  (Fluarix Quad PF)  Patient seen today for follow-up.  Lab monitoring for chronic conditions as above Given flu shot and Shingrix today She plans to get her COVID-19 booster soon Injection for greater trochanteric bursitis as above She has diclofenac at home which she can also use for pain She is asked to let me know how her hip does following injection Will plan further follow- up pending labs.   This visit occurred during the SARS-CoV-2 public health emergency.  Safety protocols were in place, including screening questions prior to the visit, additional usage of staff PPE, and extensive cleaning of exam room while observing appropriate contact time as indicated for disinfecting solutions.   Signed JesLamar BlinksD   Received her labs as below, message to patient  Results for orders placed or performed in visit on 03/03/21  CBC  Result Value Ref Range   WBC 9.0 4.0 - 10.5 K/uL   RBC 4.45 3.87 - 5.11 Mil/uL   Platelets 263.0 150.0 - 400.0 K/uL   Hemoglobin 11.3 (L) 12.0 - 15.0 g/dL   HCT 35.5 (L) 36.0 - 46.0 %   MCV 79.8 78.0 - 100.0 fl   MCHC 31.7 30.0 - 36.0 g/dL   RDW 15.2 11.5 - 15.5 %  Comprehensive metabolic panel  Result Value Ref Range   Sodium 138 135 - 145 mEq/L   Potassium 4.1 3.5 - 5.1 mEq/L   Chloride 100 96 - 112 mEq/L   CO2 28 19 - 32 mEq/L   Glucose, Bld 135 (H) 70 - 99 mg/dL   BUN 13 6 - 23 mg/dL   Creatinine, Ser 0.62 0.40 - 1.20 mg/dL   Total Bilirubin 0.3 0.2 - 1.2 mg/dL   Alkaline Phosphatase 118 (H) 39 - 117 U/L   AST 31 0 - 37 U/L   ALT 26 0 - 35 U/L   Total Protein 7.5 6.0 - 8.3 g/dL   Albumin 4.5 3.5 - 5.2 g/dL   GFR  102.20 >60.00 mL/min   Calcium 9.6 8.4 - 10.5 mg/dL  Hemoglobin A1c  Result Value Ref Range   Hgb A1c MFr Bld 7.7 (H) 4.6 - 6.5 %  Lipid panel  Result Value Ref Range   Cholesterol 165 0 - 200 mg/dL   Triglycerides 69.0 0.0 - 149.0 mg/dL   HDL 75.40 >39.00 mg/dL   VLDL 13.8 0.0 - 40.0 mg/dL   LDL Cholesterol 76 0 - 99  mg/dL   Total CHOL/HDL Ratio 2    NonHDL 89.56   TSH  Result Value Ref Range   TSH 1.91 0.35 - 5.50 uIU/mL  VITAMIN D 25 Hydroxy (Vit-D Deficiency, Fractures)  Result Value Ref Range   VITD 27.71 (L) 30.00 - 100.00 ng/mL

## 2021-03-03 ENCOUNTER — Ambulatory Visit: Payer: Managed Care, Other (non HMO) | Admitting: Family Medicine

## 2021-03-03 ENCOUNTER — Other Ambulatory Visit: Payer: Self-pay

## 2021-03-03 ENCOUNTER — Encounter: Payer: Self-pay | Admitting: Family Medicine

## 2021-03-03 ENCOUNTER — Ambulatory Visit (INDEPENDENT_AMBULATORY_CARE_PROVIDER_SITE_OTHER): Payer: Managed Care, Other (non HMO)

## 2021-03-03 VITALS — BP 144/84 | HR 90 | Temp 98.2°F | Resp 18 | Ht 59.0 in | Wt 179.0 lb

## 2021-03-03 DIAGNOSIS — E1140 Type 2 diabetes mellitus with diabetic neuropathy, unspecified: Secondary | ICD-10-CM

## 2021-03-03 DIAGNOSIS — M2142 Flat foot [pes planus] (acquired), left foot: Secondary | ICD-10-CM

## 2021-03-03 DIAGNOSIS — M79673 Pain in unspecified foot: Secondary | ICD-10-CM

## 2021-03-03 DIAGNOSIS — E782 Mixed hyperlipidemia: Secondary | ICD-10-CM | POA: Diagnosis not present

## 2021-03-03 DIAGNOSIS — M2141 Flat foot [pes planus] (acquired), right foot: Secondary | ICD-10-CM | POA: Diagnosis not present

## 2021-03-03 DIAGNOSIS — IMO0002 Reserved for concepts with insufficient information to code with codable children: Secondary | ICD-10-CM

## 2021-03-03 DIAGNOSIS — M1A09X0 Idiopathic chronic gout, multiple sites, without tophus (tophi): Secondary | ICD-10-CM

## 2021-03-03 DIAGNOSIS — E119 Type 2 diabetes mellitus without complications: Secondary | ICD-10-CM

## 2021-03-03 DIAGNOSIS — R5383 Other fatigue: Secondary | ICD-10-CM | POA: Diagnosis not present

## 2021-03-03 DIAGNOSIS — M7062 Trochanteric bursitis, left hip: Secondary | ICD-10-CM | POA: Diagnosis not present

## 2021-03-03 DIAGNOSIS — Z23 Encounter for immunization: Secondary | ICD-10-CM | POA: Diagnosis not present

## 2021-03-03 DIAGNOSIS — I10 Essential (primary) hypertension: Secondary | ICD-10-CM | POA: Diagnosis not present

## 2021-03-03 DIAGNOSIS — M2022 Hallux rigidus, left foot: Secondary | ICD-10-CM | POA: Diagnosis not present

## 2021-03-03 DIAGNOSIS — G8929 Other chronic pain: Secondary | ICD-10-CM

## 2021-03-03 DIAGNOSIS — M722 Plantar fascial fibromatosis: Secondary | ICD-10-CM | POA: Diagnosis not present

## 2021-03-03 DIAGNOSIS — Z1329 Encounter for screening for other suspected endocrine disorder: Secondary | ICD-10-CM | POA: Diagnosis not present

## 2021-03-03 DIAGNOSIS — M2021 Hallux rigidus, right foot: Secondary | ICD-10-CM | POA: Diagnosis not present

## 2021-03-03 DIAGNOSIS — E1165 Type 2 diabetes mellitus with hyperglycemia: Secondary | ICD-10-CM

## 2021-03-03 DIAGNOSIS — J3089 Other allergic rhinitis: Secondary | ICD-10-CM

## 2021-03-03 LAB — CBC
HCT: 35.5 % — ABNORMAL LOW (ref 36.0–46.0)
Hemoglobin: 11.3 g/dL — ABNORMAL LOW (ref 12.0–15.0)
MCHC: 31.7 g/dL (ref 30.0–36.0)
MCV: 79.8 fl (ref 78.0–100.0)
Platelets: 263 10*3/uL (ref 150.0–400.0)
RBC: 4.45 Mil/uL (ref 3.87–5.11)
RDW: 15.2 % (ref 11.5–15.5)
WBC: 9 10*3/uL (ref 4.0–10.5)

## 2021-03-03 LAB — COMPREHENSIVE METABOLIC PANEL
ALT: 26 U/L (ref 0–35)
AST: 31 U/L (ref 0–37)
Albumin: 4.5 g/dL (ref 3.5–5.2)
Alkaline Phosphatase: 118 U/L — ABNORMAL HIGH (ref 39–117)
BUN: 13 mg/dL (ref 6–23)
CO2: 28 mEq/L (ref 19–32)
Calcium: 9.6 mg/dL (ref 8.4–10.5)
Chloride: 100 mEq/L (ref 96–112)
Creatinine, Ser: 0.62 mg/dL (ref 0.40–1.20)
GFR: 102.2 mL/min (ref >60.00)
Glucose, Bld: 135 mg/dL — ABNORMAL HIGH (ref 70–99)
Potassium: 4.1 mEq/L (ref 3.5–5.1)
Sodium: 138 mEq/L (ref 135–145)
Total Bilirubin: 0.3 mg/dL (ref 0.2–1.2)
Total Protein: 7.5 g/dL (ref 6.0–8.3)

## 2021-03-03 LAB — HEMOGLOBIN A1C: Hgb A1c MFr Bld: 7.7 % — ABNORMAL HIGH (ref 4.6–6.5)

## 2021-03-03 LAB — LIPID PANEL
Cholesterol: 165 mg/dL (ref 0–200)
HDL: 75.4 mg/dL (ref >39.00)
LDL Cholesterol: 76 mg/dL (ref 0–99)
NonHDL: 89.56
Total CHOL/HDL Ratio: 2
Triglycerides: 69 mg/dL (ref 0.0–149.0)
VLDL: 13.8 mg/dL (ref 0.0–40.0)

## 2021-03-03 LAB — TSH: TSH: 1.91 u[IU]/mL (ref 0.35–5.50)

## 2021-03-03 LAB — VITAMIN D 25 HYDROXY (VIT D DEFICIENCY, FRACTURES): VITD: 27.71 ng/mL — ABNORMAL LOW (ref 30.00–100.00)

## 2021-03-03 MED ORDER — METHYLPREDNISOLONE ACETATE 40 MG/ML IJ SUSP
40.0000 mg | Freq: Once | INTRAMUSCULAR | Status: AC
  Administered 2021-03-03: 40 mg via INTRA_ARTICULAR

## 2021-03-03 NOTE — Progress Notes (Addendum)
Patient in office to pick-up diabetic shoes and custom inserts. Patient tried on shoes with custom inserts and stated she was satisfied with the fit and feel of both the shoes and inserts. Educated the patient on the break-in process and return policy at this time. Patient verbalized understanding. Advised the patient to call the office with an questions, comments, or concerns.

## 2021-03-09 LAB — HM DIABETES EYE EXAM

## 2021-03-16 ENCOUNTER — Encounter: Payer: Self-pay | Admitting: *Deleted

## 2021-03-21 ENCOUNTER — Other Ambulatory Visit: Payer: Self-pay | Admitting: Family Medicine

## 2021-03-21 DIAGNOSIS — M1A09X0 Idiopathic chronic gout, multiple sites, without tophus (tophi): Secondary | ICD-10-CM

## 2021-03-21 DIAGNOSIS — I10 Essential (primary) hypertension: Secondary | ICD-10-CM

## 2021-03-21 DIAGNOSIS — E118 Type 2 diabetes mellitus with unspecified complications: Secondary | ICD-10-CM

## 2021-03-22 ENCOUNTER — Other Ambulatory Visit (HOSPITAL_BASED_OUTPATIENT_CLINIC_OR_DEPARTMENT_OTHER): Payer: Self-pay

## 2021-03-22 MED ORDER — AMLODIPINE BESYLATE 10 MG PO TABS
ORAL_TABLET | Freq: Every day | ORAL | 11 refills | Status: DC
  Filled 2021-03-22: qty 30, 30d supply, fill #0
  Filled 2021-04-27: qty 30, 30d supply, fill #1
  Filled 2021-05-22: qty 30, 30d supply, fill #2
  Filled 2021-06-27: qty 30, 30d supply, fill #3
  Filled 2021-07-28: qty 30, 30d supply, fill #4
  Filled 2021-08-25: qty 90, 90d supply, fill #5
  Filled 2021-08-25: qty 30, 30d supply, fill #5
  Filled 2021-11-20: qty 90, 90d supply, fill #6
  Filled 2022-02-24: qty 90, 90d supply, fill #7
  Filled 2022-02-25: qty 30, 30d supply, fill #7

## 2021-03-22 MED ORDER — METFORMIN HCL 1000 MG PO TABS
ORAL_TABLET | Freq: Two times a day (BID) | ORAL | 11 refills | Status: DC
  Filled 2021-03-22: qty 60, 30d supply, fill #0
  Filled 2021-04-27: qty 60, 30d supply, fill #1
  Filled 2021-05-22: qty 60, 30d supply, fill #2
  Filled 2021-06-27: qty 60, 30d supply, fill #3
  Filled 2021-07-28: qty 60, 30d supply, fill #4
  Filled 2021-09-06: qty 60, 30d supply, fill #5
  Filled 2021-10-03: qty 60, 30d supply, fill #6

## 2021-03-22 MED ORDER — ALLOPURINOL 100 MG PO TABS
ORAL_TABLET | Freq: Every day | ORAL | 11 refills | Status: DC
  Filled 2021-03-22: qty 30, 30d supply, fill #0
  Filled 2021-04-27: qty 30, 30d supply, fill #1
  Filled 2021-05-22: qty 30, 30d supply, fill #2
  Filled 2021-06-27: qty 30, 30d supply, fill #3
  Filled 2021-07-28: qty 30, 30d supply, fill #4
  Filled 2021-08-25: qty 90, 90d supply, fill #5
  Filled 2021-08-25: qty 30, 30d supply, fill #5
  Filled 2021-11-20: qty 90, 90d supply, fill #6
  Filled 2022-02-24: qty 30, 30d supply, fill #7

## 2021-03-22 MED ORDER — LISINOPRIL 10 MG PO TABS
ORAL_TABLET | Freq: Every day | ORAL | 11 refills | Status: DC
  Filled 2021-03-22: qty 30, 30d supply, fill #0
  Filled 2021-04-27: qty 30, 30d supply, fill #1
  Filled 2021-05-22: qty 30, 30d supply, fill #2
  Filled 2021-06-27: qty 30, 30d supply, fill #3
  Filled 2021-07-28: qty 30, 30d supply, fill #4
  Filled 2021-08-25: qty 90, 90d supply, fill #5
  Filled 2021-08-25: qty 30, 30d supply, fill #5
  Filled 2021-11-20: qty 90, 90d supply, fill #6
  Filled 2022-02-24: qty 90, 90d supply, fill #7
  Filled 2022-02-25: qty 30, 30d supply, fill #7

## 2021-03-27 ENCOUNTER — Other Ambulatory Visit: Payer: Self-pay | Admitting: Family Medicine

## 2021-03-27 DIAGNOSIS — E119 Type 2 diabetes mellitus without complications: Secondary | ICD-10-CM

## 2021-03-29 ENCOUNTER — Other Ambulatory Visit (HOSPITAL_BASED_OUTPATIENT_CLINIC_OR_DEPARTMENT_OTHER): Payer: Self-pay

## 2021-03-29 ENCOUNTER — Other Ambulatory Visit: Payer: Self-pay

## 2021-03-29 MED ORDER — GLIPIZIDE ER 10 MG PO TB24
10.0000 mg | ORAL_TABLET | Freq: Every day | ORAL | 0 refills | Status: DC
  Filled 2021-03-29: qty 90, 90d supply, fill #0

## 2021-04-27 ENCOUNTER — Ambulatory Visit: Payer: Managed Care, Other (non HMO) | Attending: Internal Medicine

## 2021-04-27 ENCOUNTER — Other Ambulatory Visit (HOSPITAL_BASED_OUTPATIENT_CLINIC_OR_DEPARTMENT_OTHER): Payer: Self-pay

## 2021-04-27 DIAGNOSIS — Z23 Encounter for immunization: Secondary | ICD-10-CM

## 2021-04-27 MED FILL — Cetirizine HCl Tab 10 MG: ORAL | 100 days supply | Qty: 100 | Fill #1 | Status: AC

## 2021-04-27 NOTE — Progress Notes (Signed)
   Covid-19 Vaccination Clinic  Name:  Deborah [name]    MRN: [id number] DOB: Dec 17, 1970  04/27/2021  [name] was observed post Covid-19 immunization for 15 minutes without incident. She was provided with Vaccine Information Sheet and instruction to access the V-Safe system.   [name] was instructed to call 911 with any severe reactions post vaccine: Difficulty breathing  Swelling of face and throat  A fast heartbeat  A bad rash all over body  Dizziness and weakness   Immunizations Administered     Name Date Dose VIS Date Route   Pfizer Covid-19 Vaccine Bivalent Booster 04/27/2021  9:59 AM 0.3 mL 02/03/2021 Intramuscular   Manufacturer: [place]   Lot: [id number]   [place]: [phone]

## 2021-05-08 ENCOUNTER — Other Ambulatory Visit (HOSPITAL_BASED_OUTPATIENT_CLINIC_OR_DEPARTMENT_OTHER): Payer: Self-pay

## 2021-05-08 MED ORDER — PFIZER COVID-19 VAC BIVALENT 30 MCG/0.3ML IM SUSP
INTRAMUSCULAR | 0 refills | Status: DC
  Filled 2021-05-08: qty 0.3, 1d supply, fill #0

## 2021-05-10 ENCOUNTER — Other Ambulatory Visit (HOSPITAL_BASED_OUTPATIENT_CLINIC_OR_DEPARTMENT_OTHER): Payer: Self-pay

## 2021-05-24 ENCOUNTER — Other Ambulatory Visit (HOSPITAL_BASED_OUTPATIENT_CLINIC_OR_DEPARTMENT_OTHER): Payer: Self-pay

## 2021-06-01 ENCOUNTER — Other Ambulatory Visit: Payer: Self-pay

## 2021-06-01 ENCOUNTER — Encounter (HOSPITAL_BASED_OUTPATIENT_CLINIC_OR_DEPARTMENT_OTHER): Payer: Self-pay | Admitting: *Deleted

## 2021-06-01 ENCOUNTER — Emergency Department (HOSPITAL_BASED_OUTPATIENT_CLINIC_OR_DEPARTMENT_OTHER)
Admission: EM | Admit: 2021-06-01 | Discharge: 2021-06-01 | Disposition: A | Discharge status: Home / Self Care | Payer: Managed Care, Other (non HMO) | Attending: Student | Admitting: Student

## 2021-06-01 DIAGNOSIS — L089 Local infection of the skin and subcutaneous tissue, unspecified: Secondary | ICD-10-CM | POA: Diagnosis not present

## 2021-06-01 DIAGNOSIS — R21 Rash and other nonspecific skin eruption: Secondary | ICD-10-CM | POA: Diagnosis present

## 2021-06-01 DIAGNOSIS — Z79899 Other long term (current) drug therapy: Secondary | ICD-10-CM | POA: Diagnosis not present

## 2021-06-01 DIAGNOSIS — Z91040 Latex allergy status: Secondary | ICD-10-CM | POA: Insufficient documentation

## 2021-06-01 DIAGNOSIS — E1140 Type 2 diabetes mellitus with diabetic neuropathy, unspecified: Secondary | ICD-10-CM | POA: Diagnosis not present

## 2021-06-01 DIAGNOSIS — Z87891 Personal history of nicotine dependence: Secondary | ICD-10-CM | POA: Insufficient documentation

## 2021-06-01 DIAGNOSIS — I10 Essential (primary) hypertension: Secondary | ICD-10-CM | POA: Diagnosis not present

## 2021-06-01 DIAGNOSIS — Z7984 Long term (current) use of oral hypoglycemic drugs: Secondary | ICD-10-CM | POA: Diagnosis not present

## 2021-06-01 NOTE — ED Notes (Signed)
Pt has pain and cracking around her nails left middle is the worse. Patient states that she put neosporin on her nail even though she knew she was allergic because a coworker advised her to. States that she then got bumps on her arms that 'cant be seen but that I can feel'.

## 2021-06-01 NOTE — Discharge Instructions (Signed)
Recommend warm Epson water soaks salt for 20 minutes.  Afterwards, pat dry and then apply bacitracin.  Avoid Neosporin.  Follow-up with hand Ortho if not improving, call to schedule appointment. Recommend removing acrylic nails.

## 2021-06-01 NOTE — ED Triage Notes (Signed)
Pain under her left middle finger nail. The skin is cracking.

## 2021-06-01 NOTE — ED Provider Notes (Signed)
[facility] EMERGENCY DEPARTMENT Provider Note   CSN: [id number] Arrival date & time: 06/01/21  1733     History Chief Complaint  Patient presents with   Rash    Deborah [name] is a 50 y.o. female.  50 year old female presents with concern for infection in her right third distal phalanx.  Patient has acrylic nails, states that her fingers frequently crack and split below the fingernail of the tip of the finger.  Her right third finger has been an ongoing problem, managed by the nurse at work who has recommended Neosporin and a Band-Aid.  Patient is a diabetic, concerned that she has a developing infection due to throbbing pain in her finger.  Denies injury to the finger.      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   MUSCULOSKELETAL PAIN 10/01/2009   DYSLIPIDEMIA 12/05/2008   FATTY LIVER DISEASE 10/20/2008   NUMMULAR ECZEMA 05/22/2008   FEMALE INFERTILITY 05/01/2007   ANEMIA, IRON DEFICIENCY 04/03/2007   SMOKER 11/28/2006   HYPERTENSION, MILD 11/28/2006   HEMORRHOIDS, INTERNAL W/O COMPLICATION 14/43/1540   RHINITIS, ALLERGIC, DUE TO POLLEN 11/28/2006   GERD 11/28/2006   DERMATITIS, ATOPIC 11/28/2006   COCAINE ABUSE, HX OF 11/28/2006   LIVER FUNCTION TESTS, ABNORMAL 09/14/2006   GOUT 06/25/2004   MORTON'S NEUROMA, 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    Past Surgical History:  Procedure Laterality Date   ANAL RECTAL MANOMETRY N/A 04/20/2020   Procedure: ANO RECTAL MANOMETRY;  Surgeon:  Ileana Roup, MD;  Location: [facility];  Service: General;  Laterality: N/A;   HEMORRHOID SURGERY       OB History     Gravida  0   Para      Term      Preterm      AB      Living  0      SAB      IAB      Ectopic      Multiple      Live Births              Family History  Problem Relation Age of Onset   Hypertension Father    Diabetes Father    Cancer Father    Hypertension Other    Diabetes Other    Diabetes Mother    Hypertension Mother    Diabetes Maternal Grandmother    Hypertension Maternal Grandmother    Diabetes Paternal Grandfather    Colon cancer Neg Hx    Colon polyps Neg Hx    Esophageal cancer Neg Hx    Rectal cancer Neg Hx    Stomach cancer Neg Hx     Social History   Tobacco Use   Smoking status: Former    Packs/day: 0.50    Types: Cigarettes    Quit date: 04/24/2013    Years since quitting: 8.1   Smokeless  tobacco: Former    Quit date: 03/14/2013  Vaping Use   Vaping Use: Never used  Substance Use Topics   Alcohol use: No    Comment: none since 03-14-13   Drug use: Not Currently    Types: Marijuana, Cocaine    Comment: Quit 04/14/2013 per pt.     Home Medications Prior to Admission medications   Medication Sig Start Date End Date Taking? Authorizing Provider  allopurinol (ZYLOPRIM) 100 MG tablet TAKE 1 TABLET BY MOUTH ONCE DAILY 03/22/21 03/22/22  Copland, Gay Filler, MD  amLODipine (NORVASC) 10 MG tablet TAKE 1 TABLET BY MOUTH ONCE DAILY 03/22/21 03/22/22  Copland, Gay Filler, MD  blood glucose meter kit and supplies Dispense based on patient and insurance preference. Use up to four times daily as directed. (FOR ICD-10 E10.9, E11.9). 04/16/19   Copland, Gay Filler, MD  cetirizine (ZYRTEC) 10 MG tablet TAKE 1 TABLET BY MOUTH ONCE DAILY AS NEEDED FOR ALLERGIES 08/31/20 08/31/21  Copland, Gay Filler, MD  COVID-19 mRNA bivalent vaccine, Pfizer, (PFIZER COVID-19 VAC BIVALENT) injection Inject into the muscle. 04/27/21    Carlyle Basques, MD  COVID-19 mRNA Vac-TriS, Pfizer, SUSP injection Inject into the muscle. 12/08/20   Carlyle Basques, MD  diclofenac (VOLTAREN) 75 MG EC tablet TAKE 1 TABLET (75 MG TOTAL) BY MOUTH 2 (TWO) TIMES DAILY. USE AS NEEDED 08/31/20 08/31/21  Copland, Gay Filler, MD  glipiZIDE (GLUCOTROL XL) 10 MG 24 hr tablet Take 1 tablet (10 mg total) by mouth daily with breakfast. 03/29/21 03/29/22  Copland, Gay Filler, MD  indomethacin (INDOCIN) 50 MG capsule Take 1 capsule (50 mg total) by mouth 2 (two) times daily. Use as needed for gout flare 03/02/20   Copland, Gay Filler, MD  lisinopril (ZESTRIL) 10 MG tablet TAKE 1 TABLET BY MOUTH ONCE DAILY 03/22/21 03/22/22  Copland, Gay Filler, MD  metFORMIN (GLUCOPHAGE) 1000 MG tablet TAKE 1 TABLET BY MOUTH TWICE DAILY 03/22/21 03/22/22  Copland, Gay Filler, MD  Polyethylene Glycol 3350 (MIRALAX PO) Take by mouth.    [provider]    Allergies    Cephalexin, Hydrocodone, Latex, Neomycin-bacitracin zn-polymyx, Sertraline, and Adhesive [tape]  Review of Systems   Review of Systems  Constitutional:  Negative for fever.  Musculoskeletal:  Positive for myalgias. Negative for arthralgias and joint swelling.  Skin:  Positive for wound.  Allergic/Immunologic: Positive for immunocompromised state.  Neurological:  Negative for weakness and numbness.  All other systems reviewed and are negative.  Physical Exam Updated Vital Signs BP (!) 143/75 (BP Location: Left Arm)    Pulse 89    Temp 98.3 F (36.8 C) (Oral)    Resp 18    Ht 4' 11" (1.499 m)    Wt 81.2 kg    SpO2 99%    BMI 36.16 kg/m   Physical Exam Vitals and nursing note reviewed.  Constitutional:      General: She is not in acute distress.    Appearance: She is well-developed. She is not diaphoretic.  HENT:     Head: Normocephalic and atraumatic.  Cardiovascular:     Pulses: Normal pulses.  Pulmonary:     Effort: Pulmonary effort is normal.  Musculoskeletal:        General: Swelling  present. No tenderness, deformity or signs of injury. Normal range of motion.     Comments: Appears to have hypertrophy under right third fingernail distally, this area is tender.  The pad of the digit is soft, no erythema.  Normal range of  motion without pain.  Skin:    General: Skin is warm and dry.     Findings: No erythema.  Neurological:     Mental Status: She is alert and oriented to person, place, and time.     Sensory: No sensory deficit.     Motor: No weakness.  Psychiatric:        Behavior: Behavior normal.    ED Results / Procedures / Treatments   Labs (all labs ordered are listed, but only abnormal results are displayed) Labs Reviewed - No data to display  EKG None  Radiology No results found.  Procedures Procedures   Medications Ordered in ED Medications - No data to display  ED Course  I have reviewed the triage vital signs and the nursing notes.  Pertinent labs & imaging results that were available during my care of the patient were reviewed by me and considered in my medical decision making (see chart for details).  Clinical Course as of 06/01/21 1835  Tue Jun 02, 4943  4317 50 year old female with concern for infection in her right third finger.  Does have acrylic nails which somewhat limit exam.  Discussed possible bacterial versus fungal infection.  Doubt felon or drainable collection at this time.  Recommend stopping Neosporin as she admits she is allergic to neomycin.  Recommend warm Epson salt soak for 20 minutes, dry thoroughly and then apply bacitracin ointment.  Patient is planning to have her acrylic nails removed tomorrow.  Advised not to have new nails applied.  Referred to hand Ortho for follow-up if needed. [LM]    Clinical Course User Index [LM] Roque Lias   MDM Rules/Calculators/A&P                            Final Clinical Impression(s) / ED Diagnoses Final diagnoses:  Finger infection    Rx / DC Orders ED Discharge  Orders     None        Roque Lias 06/01/21 Koleen Distance, MD 06/01/21 [phone]

## 2021-06-27 MED FILL — Diclofenac Sodium Tab Delayed Release 75 MG: ORAL | 30 days supply | Qty: 60 | Fill #0 | Status: AC

## 2021-06-28 ENCOUNTER — Other Ambulatory Visit (HOSPITAL_BASED_OUTPATIENT_CLINIC_OR_DEPARTMENT_OTHER): Payer: Self-pay

## 2021-07-20 NOTE — Patient Instructions (Addendum)
Good to see you again today- please see me in about 6 months assuming all is well   Try some docusate sodium/ colace as a stool softener instead of the colace   I am so sorry to hear about the loss of your aunt!  That is terrible  Let me know if the hip injection does not help like it did last time   Keep up the good work with exercise   2nd dose of shingles vaccine given today
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
## 2021-07-28 ENCOUNTER — Encounter: Payer: Self-pay | Admitting: Family Medicine

## 2021-07-28 ENCOUNTER — Ambulatory Visit: Payer: Managed Care, Other (non HMO) | Admitting: Family Medicine

## 2021-07-28 ENCOUNTER — Other Ambulatory Visit (HOSPITAL_BASED_OUTPATIENT_CLINIC_OR_DEPARTMENT_OTHER): Payer: Self-pay

## 2021-07-28 VITALS — BP 150/90 | HR 80 | Resp 20 | Ht 59.0 in | Wt 176.8 lb

## 2021-07-28 DIAGNOSIS — E782 Mixed hyperlipidemia: Secondary | ICD-10-CM | POA: Diagnosis not present

## 2021-07-28 DIAGNOSIS — Z23 Encounter for immunization: Secondary | ICD-10-CM | POA: Diagnosis not present

## 2021-07-28 DIAGNOSIS — E119 Type 2 diabetes mellitus without complications: Secondary | ICD-10-CM

## 2021-07-28 DIAGNOSIS — M7062 Trochanteric bursitis, left hip: Secondary | ICD-10-CM | POA: Diagnosis not present

## 2021-07-28 DIAGNOSIS — I10 Essential (primary) hypertension: Secondary | ICD-10-CM

## 2021-07-28 LAB — CBC
HCT: 37.6 % (ref 36.0–46.0)
Hemoglobin: 11.9 g/dL — ABNORMAL LOW (ref 12.0–15.0)
MCHC: 31.5 g/dL (ref 30.0–36.0)
MCV: 79.3 fl (ref 78.0–100.0)
Platelets: 260 10*3/uL (ref 150.0–400.0)
RBC: 4.74 Mil/uL (ref 3.87–5.11)
RDW: 14.9 % (ref 11.5–15.5)
WBC: 7 10*3/uL (ref 4.0–10.5)

## 2021-07-28 LAB — BASIC METABOLIC PANEL
BUN: 11 mg/dL (ref 6–23)
CO2: 33 mEq/L — ABNORMAL HIGH (ref 19–32)
Calcium: 10.1 mg/dL (ref 8.4–10.5)
Chloride: 100 mEq/L (ref 96–112)
Creatinine, Ser: 0.65 mg/dL (ref 0.40–1.20)
GFR: 100.76 mL/min (ref >60.00)
Glucose, Bld: 193 mg/dL — ABNORMAL HIGH (ref 70–99)
Potassium: 4.5 mEq/L (ref 3.5–5.1)
Sodium: 137 mEq/L (ref 135–145)

## 2021-07-28 LAB — HEMOGLOBIN A1C: Hgb A1c MFr Bld: 8.7 % — ABNORMAL HIGH (ref 4.6–6.5)

## 2021-07-28 MED ORDER — METHYLPREDNISOLONE ACETATE 40 MG/ML IJ SUSP
40.0000 mg | Freq: Once | INTRAMUSCULAR | Status: AC
  Administered 2021-07-28: 40 mg via INTRAMUSCULAR

## 2021-07-28 MED FILL — Cetirizine HCl Tab 10 MG: ORAL | 100 days supply | Qty: 100 | Fill #2 | Status: AC

## 2021-07-28 MED FILL — Diclofenac Sodium Tab Delayed Release 75 MG: ORAL | 30 days supply | Qty: 60 | Fill #1 | Status: AC

## 2021-08-25 ENCOUNTER — Other Ambulatory Visit (HOSPITAL_BASED_OUTPATIENT_CLINIC_OR_DEPARTMENT_OTHER): Payer: Self-pay

## 2021-09-06 ENCOUNTER — Other Ambulatory Visit (HOSPITAL_BASED_OUTPATIENT_CLINIC_OR_DEPARTMENT_OTHER): Payer: Self-pay

## 2021-09-14 ENCOUNTER — Ambulatory Visit: Payer: Managed Care, Other (non HMO) | Admitting: Medical

## 2021-09-14 ENCOUNTER — Other Ambulatory Visit (HOSPITAL_BASED_OUTPATIENT_CLINIC_OR_DEPARTMENT_OTHER): Payer: Self-pay

## 2021-09-14 ENCOUNTER — Telehealth: Payer: Self-pay

## 2021-09-14 VITALS — BP 147/74 | HR 87 | Temp 98.2°F | Resp 18 | Ht 59.0 in | Wt 177.0 lb

## 2021-09-14 DIAGNOSIS — R059 Cough, unspecified: Secondary | ICD-10-CM

## 2021-09-14 DIAGNOSIS — R062 Wheezing: Secondary | ICD-10-CM | POA: Diagnosis not present

## 2021-09-14 DIAGNOSIS — J3489 Other specified disorders of nose and nasal sinuses: Secondary | ICD-10-CM

## 2021-09-14 DIAGNOSIS — J40 Bronchitis, not specified as acute or chronic: Secondary | ICD-10-CM | POA: Diagnosis not present

## 2021-09-14 MED ORDER — ALBUTEROL SULFATE HFA 108 (90 BASE) MCG/ACT IN AERS
2.0000 | INHALATION_SPRAY | Freq: Four times a day (QID) | RESPIRATORY_TRACT | 0 refills | Status: DC | PRN
  Filled 2021-09-14: qty 18, 30d supply, fill #0

## 2021-09-14 MED ORDER — AZITHROMYCIN 250 MG PO TABS
ORAL_TABLET | ORAL | 0 refills | Status: AC
  Filled 2021-09-14: qty 6, 5d supply, fill #0

## 2021-09-14 MED ORDER — BENZONATATE 100 MG PO CAPS
100.0000 mg | ORAL_CAPSULE | Freq: Three times a day (TID) | ORAL | 0 refills | Status: DC | PRN
  Filled 2021-09-14: qty 30, 10d supply, fill #0

## 2021-09-14 NOTE — Telephone Encounter (Signed)
Opened to print work note ?

## 2021-09-14 NOTE — Patient Instructions (Addendum)
You appear to have bronchitis with some laryngitis as well. Rest hydrate and tylenol for fever. I am prescribing cough medicine benzonatate, and azithromycin antibiotic. For your nasal congestion continue flonase.  ? ?Need to rest your voice and giving 2 day work excuse. ? ?For wheezing making albuterol available.  ? ?You should gradually get better. If not then notify us and would recommend a chest xray. ? ?Follow up in 7-10 days or as needed  ?

## 2021-09-14 NOTE — Progress Notes (Signed)
? ?Subjective:  ? ? Patient ID: Deborah [name], female    DOB: May 18, 1971, 51 y.o.   MRN: [id number] ? ?HPI ? ?Pt in for 3 days of productive cough, chest congestion, hoarse voice and sore throat. Hurts to swallow. Frontal sinus pressure and mild ha.  ? ?Pt feels some chest tightness briefly during coughing episodes. She can hear herself wheeze. No hx asthma. Has allergies year round and does wheeze at times. ? ?Mucinex, nyquil and delsym not working. ? ?Pt had negative covid test on Saturday and negative test today at work. ? ?Pt was in wedding on Saturday during the cold rain. ? ?Review of Systems  ?Constitutional:  Negative for chills, fatigue and fever.  ?HENT:  Positive for congestion and sinus pressure. Negative for nosebleeds, postnasal drip and sneezing.   ?Respiratory:  Positive for cough and wheezing. Negative for chest tightness and shortness of breath.   ?Cardiovascular:  Negative for chest pain and palpitations.  ?Gastrointestinal:  Negative for abdominal pain, constipation and nausea.  ?Genitourinary:  Negative for dysuria and flank pain.  ?Musculoskeletal:  Negative for back pain and joint swelling.  ?Skin:  Negative for rash.  ? ? ?Past Medical History:  ?Diagnosis Date  ? Allergy   ? Anemia   ? Arthritis   ? Diabetes mellitus   ? Eczema   ? GERD (gastroesophageal reflux disease)   ? Gout   ? Hyperlipidemia   ? no meds  ? Hypertension   ? Neuromuscular disorder ([place])   ? Plantar fasciitis   ? Polysubstance abuse ([place])   ? ETOH and Cocaine  ? Seasonal allergies   ? ?  ?Social History  ? ?Socioeconomic History  ? Marital status: Single  ?  Spouse name: Not on file  ? Number of children: Not on file  ? Years of education: Not on file  ? Highest education level: Not on file  ?Occupational History  ? Not on file  ?Tobacco Use  ? Smoking status: Former  ?  Packs/day: 0.50  ?  Types: Cigarettes  ?  Quit date: 04/24/2013  ?  Years since quitting: 8.3  ? Smokeless tobacco: Former  ?  Quit date: 03/14/2013   ?Vaping Use  ? Vaping Use: Never used  ?Substance and Sexual Activity  ? Alcohol use: No  ?  Comment: none since 03-14-13  ? Drug use: Not Currently  ?  Types: Marijuana, Cocaine  ?  Comment: Quit 04/14/2013 per pt.   ? Sexual activity: Not on file  ?Other Topics Concern  ? Not on file  ?Social History Narrative  ? Not on file  ? ?Social Determinants of Health  ? ?Financial Resource Strain: Not on file  ?Food Insecurity: Not on file  ?Transportation Needs: Not on file  ?Physical Activity: Not on file  ?Stress: Not on file  ?Social Connections: Not on file  ?Intimate Partner Violence: Not on file  ? ? ?Past Surgical History:  ?Procedure Laterality Date  ? ANAL RECTAL MANOMETRY N/A 04/20/2020  ? Procedure: ANO RECTAL MANOMETRY;  Surgeon: Ileana Roup, MD;  Location: [facility];  Service: General;  Laterality: N/A;  ? HEMORRHOID SURGERY    ? ? ?Family History  ?Problem Relation Age of Onset  ? Hypertension Father   ? Diabetes Father   ? Cancer Father   ? Hypertension Other   ? Diabetes Other   ? Diabetes Mother   ? Hypertension Mother   ? Diabetes Maternal Grandmother   ? Hypertension  Maternal Grandmother   ? Diabetes Paternal Grandfather   ? Colon cancer Neg Hx   ? Colon polyps Neg Hx   ? Esophageal cancer Neg Hx   ? Rectal cancer Neg Hx   ? Stomach cancer Neg Hx   ? ? ?Allergies  ?Allergen Reactions  ? Cephalexin   ?  REACTION: stomach cramps and hives  ? Hydrocodone   ?  headache  ? Latex   ? Neomycin-Bacitracin Zn-Polymyx   ?  REACTION: burning  ? Sertraline Hives and Itching  ? Adhesive [Tape] Hives and Rash  ? ? ?Current Outpatient Medications on File Prior to Visit  ?Medication Sig Dispense Refill  ? allopurinol (ZYLOPRIM) 100 MG tablet TAKE 1 TABLET BY MOUTH ONCE DAILY 30 tablet 11  ? amLODipine (NORVASC) 10 MG tablet TAKE 1 TABLET BY MOUTH ONCE DAILY 30 tablet 11  ? blood glucose meter kit and supplies Dispense based on patient and insurance preference. Use up to four times daily as directed. (FOR  ICD-10 E10.9, E11.9). 1 each 0  ? cetirizine (ZYRTEC) 10 MG tablet TAKE 1 TABLET BY MOUTH ONCE DAILY AS NEEDED FOR ALLERGIES 100 tablet 3  ? glipiZIDE (GLUCOTROL XL) 10 MG 24 hr tablet Take 1 tablet (10 mg total) by mouth daily with breakfast. 90 tablet 0  ? indomethacin (INDOCIN) 50 MG capsule Take 1 capsule (50 mg total) by mouth 2 (two) times daily. Use as needed for gout flare 60 capsule 3  ? lisinopril (ZESTRIL) 10 MG tablet TAKE 1 TABLET BY MOUTH ONCE DAILY 30 tablet 11  ? metFORMIN (GLUCOPHAGE) 1000 MG tablet TAKE 1 TABLET BY MOUTH TWICE DAILY 60 tablet 11  ? Polyethylene Glycol 3350 (MIRALAX PO) Take by mouth.    ? ?No current facility-administered medications on file prior to visit.  ? ? ?BP (!) 147/74   Pulse 87   Temp 98.2 ?F (36.8 ?C)   Resp 18   Ht _0  (1.499 m)   Wt 177 lb (80.3 kg)   SpO2 98%   BMI 35.75 kg/m?  ?  ?   ?Objective:  ? Physical Exam ? ?General ?Mental Status- Alert. General Appearance- Not in acute distress.  ? ?Skin ?General: Color- Normal Color. Moisture- Normal Moisture. ? ?Neck ?Carotid Arteries- Normal color. Moisture- Normal Moisture. No carotid bruits. No JVD. ? ?Chest and Lung Exam ?Auscultation: ?Breath Sounds:-even, unlabored. Faint rough breath sounds. ? ?Cardiovascular ?Auscultation:Rythm- Regular. ?Murmurs & Other Heart Sounds:Auscultation of the heart reveals- No Murmurs. ? ?Abdomen ?Inspection:-Inspeection Normal. ?Palpation/Percussion:Note:No mass. Palpation and Percussion of the abdomen reveal- Non Tender, Non Distended + BS, no rebound or guarding. ? ? ?Neurologic ?Cranial Nerve exam:- CN III-XII intact(No nystagmus), symmetric smile. ?Drift Test:- No drift. ?Romberg Exam:- Negative.  ?Heal to Toe Gait exam:-Normal. ?Finger to Nose:- Normal/Intact ?Strength:- 5/5 equal and symmetric strength both upper and lower extremities.  ? ? ?   ?Assessment & Plan:  ? ?Patient Instructions  ?You appear to have bronchitis with some laryngitis as well. Rest hydrate and  tylenol for fever. I am prescribing cough medicine benzonatate, and azithromycin antibiotic. For your nasal congestion continue flonase.  ? ?Need to rest your voice and giving 2 day work excuse. ? ?For wheezing making albuterol available.  ? ?You should gradually get better. If not then notify us and would recommend a chest xray. ? ?Follow up in 7-10 days or as needed   ? ? ?Mackie Pai, PA-C  ?

## 2021-09-16 ENCOUNTER — Telehealth: Payer: Self-pay

## 2021-09-16 ENCOUNTER — Other Ambulatory Visit (HOSPITAL_BASED_OUTPATIENT_CLINIC_OR_DEPARTMENT_OTHER): Payer: Self-pay

## 2021-09-16 MED ORDER — PROMETHAZINE-DM 6.25-15 MG/5ML PO SYRP
5.0000 mL | ORAL_SOLUTION | Freq: Four times a day (QID) | ORAL | 0 refills | Status: DC | PRN
  Filled 2021-09-16: qty 118, 6d supply, fill #0

## 2021-09-16 NOTE — Addendum Note (Signed)
Addended by: Anabel Halon on: 09/16/2021 04:28 PM ? ? Modules accepted: Orders ? ?

## 2021-09-16 NOTE — Telephone Encounter (Signed)
Pt was last seen earlier this week and reports that the Tessalon pearls do not work for her. She is requesting something else since the cough is worse and it is keeping her up at night.  ?

## 2021-09-17 NOTE — Telephone Encounter (Signed)
I called pt and notified her that the rx was called in she stated understanding and will pick up the medication today ?

## 2021-09-24 ENCOUNTER — Encounter: Payer: Self-pay | Admitting: Family Medicine

## 2021-09-24 ENCOUNTER — Other Ambulatory Visit (HOSPITAL_BASED_OUTPATIENT_CLINIC_OR_DEPARTMENT_OTHER): Payer: Self-pay

## 2021-09-24 ENCOUNTER — Other Ambulatory Visit: Payer: Self-pay | Admitting: Medical

## 2021-09-24 MED ORDER — PROMETHAZINE-DM 6.25-15 MG/5ML PO SYRP
5.0000 mL | ORAL_SOLUTION | Freq: Four times a day (QID) | ORAL | 0 refills | Status: DC | PRN
  Filled 2021-09-24 – 2021-10-07 (×2): qty 118, 6d supply, fill #0

## 2021-09-24 MED ORDER — PROMETHAZINE-DM 6.25-15 MG/5ML PO SYRP
5.0000 mL | ORAL_SOLUTION | Freq: Four times a day (QID) | ORAL | 0 refills | Status: DC | PRN
  Filled 2021-09-24: qty 118, 6d supply, fill #0

## 2021-10-04 ENCOUNTER — Other Ambulatory Visit (HOSPITAL_BASED_OUTPATIENT_CLINIC_OR_DEPARTMENT_OTHER): Payer: Self-pay

## 2021-10-04 ENCOUNTER — Other Ambulatory Visit (HOSPITAL_BASED_OUTPATIENT_CLINIC_OR_DEPARTMENT_OTHER): Payer: Self-pay | Admitting: Family Medicine

## 2021-10-04 DIAGNOSIS — Z1231 Encounter for screening mammogram for malignant neoplasm of breast: Secondary | ICD-10-CM

## 2021-10-05 ENCOUNTER — Encounter (HOSPITAL_BASED_OUTPATIENT_CLINIC_OR_DEPARTMENT_OTHER): Payer: Self-pay

## 2021-10-05 ENCOUNTER — Ambulatory Visit (HOSPITAL_BASED_OUTPATIENT_CLINIC_OR_DEPARTMENT_OTHER)
Admission: RE | Admit: 2021-10-05 | Discharge: 2021-10-05 | Disposition: A | Discharge status: Home / Self Care | Payer: Managed Care, Other (non HMO) | Source: Ambulatory Visit | Attending: Family Medicine | Admitting: Family Medicine

## 2021-10-05 DIAGNOSIS — Z1231 Encounter for screening mammogram for malignant neoplasm of breast: Secondary | ICD-10-CM | POA: Insufficient documentation

## 2021-10-07 ENCOUNTER — Other Ambulatory Visit (HOSPITAL_BASED_OUTPATIENT_CLINIC_OR_DEPARTMENT_OTHER): Payer: Self-pay

## 2021-10-07 NOTE — Telephone Encounter (Signed)
Pt's mychart did not populate message. Advised pt of message from pcp. She will call back Monday if she is still not feeling well.  ?

## 2021-10-09 ENCOUNTER — Encounter (HOSPITAL_BASED_OUTPATIENT_CLINIC_OR_DEPARTMENT_OTHER): Payer: Self-pay

## 2021-10-09 ENCOUNTER — Other Ambulatory Visit: Payer: Self-pay

## 2021-10-09 DIAGNOSIS — S3992XA Unspecified injury of lower back, initial encounter: Secondary | ICD-10-CM | POA: Diagnosis not present

## 2021-10-09 DIAGNOSIS — Z7984 Long term (current) use of oral hypoglycemic drugs: Secondary | ICD-10-CM | POA: Diagnosis not present

## 2021-10-09 DIAGNOSIS — I10 Essential (primary) hypertension: Secondary | ICD-10-CM | POA: Insufficient documentation

## 2021-10-09 DIAGNOSIS — Z79899 Other long term (current) drug therapy: Secondary | ICD-10-CM | POA: Insufficient documentation

## 2021-10-09 DIAGNOSIS — E119 Type 2 diabetes mellitus without complications: Secondary | ICD-10-CM | POA: Diagnosis not present

## 2021-10-09 DIAGNOSIS — Y92410 Unspecified street and highway as the place of occurrence of the external cause: Secondary | ICD-10-CM | POA: Diagnosis not present

## 2021-10-09 DIAGNOSIS — Z91040 Latex allergy status: Secondary | ICD-10-CM | POA: Diagnosis not present

## 2021-10-09 DIAGNOSIS — Z87891 Personal history of nicotine dependence: Secondary | ICD-10-CM | POA: Diagnosis not present
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
## 2021-10-10 ENCOUNTER — Emergency Department (HOSPITAL_BASED_OUTPATIENT_CLINIC_OR_DEPARTMENT_OTHER)
Admission: EM | Admit: 2021-10-10 | Discharge: 2021-10-10 | Disposition: A | Discharge status: Home / Self Care | Payer: Managed Care, Other (non HMO) | Attending: Emergency Medicine | Admitting: Emergency Medicine

## 2021-10-10 ENCOUNTER — Emergency Department (HOSPITAL_BASED_OUTPATIENT_CLINIC_OR_DEPARTMENT_OTHER): Payer: Managed Care, Other (non HMO)

## 2021-10-10 DIAGNOSIS — M545 Low back pain, unspecified: Secondary | ICD-10-CM

## 2021-10-10 MED ORDER — NAPROXEN 250 MG PO TABS
500.0000 mg | ORAL_TABLET | Freq: Once | ORAL | Status: AC
  Administered 2021-10-10: 500 mg via ORAL
  Filled 2021-10-10: qty 2

## 2021-10-10 MED ORDER — OXYCODONE-ACETAMINOPHEN 5-325 MG PO TABS: 1.0000 | ORAL_TABLET | Freq: Four times a day (QID) | ORAL | 0 refills | Status: DC | PRN

## 2021-10-10 MED ORDER — OXYCODONE-ACETAMINOPHEN 5-325 MG PO TABS
1.0000 | ORAL_TABLET | Freq: Once | ORAL | Status: AC
  Administered 2021-10-10: 1 via ORAL
  Filled 2021-10-10: qty 1

## 2021-10-10 MED ORDER — NAPROXEN 375 MG PO TABS: ORAL_TABLET | ORAL | 0 refills | Status: DC
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
## 2021-10-19 ENCOUNTER — Other Ambulatory Visit (HOSPITAL_BASED_OUTPATIENT_CLINIC_OR_DEPARTMENT_OTHER): Payer: Self-pay

## 2021-10-19 ENCOUNTER — Ambulatory Visit (HOSPITAL_BASED_OUTPATIENT_CLINIC_OR_DEPARTMENT_OTHER)
Admission: RE | Admit: 2021-10-19 | Discharge: 2021-10-19 | Disposition: A | Discharge status: Home / Self Care | Payer: Managed Care, Other (non HMO) | Source: Ambulatory Visit | Attending: Family | Admitting: Family

## 2021-10-19 ENCOUNTER — Other Ambulatory Visit: Payer: Self-pay | Admitting: Family

## 2021-10-19 ENCOUNTER — Encounter: Payer: Self-pay | Admitting: Family

## 2021-10-19 ENCOUNTER — Ambulatory Visit: Payer: Managed Care, Other (non HMO) | Admitting: Family

## 2021-10-19 VITALS — BP 140/68 | HR 80 | Temp 98.0°F | Ht 59.0 in | Wt 179.0 lb

## 2021-10-19 DIAGNOSIS — R053 Chronic cough: Secondary | ICD-10-CM | POA: Diagnosis present

## 2021-10-19 DIAGNOSIS — E119 Type 2 diabetes mellitus without complications: Secondary | ICD-10-CM

## 2021-10-19 MED ORDER — DOXYCYCLINE HYCLATE 100 MG PO TABS
100.0000 mg | ORAL_TABLET | Freq: Two times a day (BID) | ORAL | 0 refills | Status: DC
  Filled 2021-10-19: qty 14, 7d supply, fill #0

## 2021-10-19 MED ORDER — PROMETHAZINE-DM 6.25-15 MG/5ML PO SYRP
5.0000 mL | ORAL_SOLUTION | Freq: Four times a day (QID) | ORAL | 0 refills | Status: DC | PRN
  Filled 2021-10-19: qty 118, 6d supply, fill #0

## 2021-10-19 MED ORDER — PREDNISONE 20 MG PO TABS
20.0000 mg | ORAL_TABLET | Freq: Every day | ORAL | 0 refills | Status: DC
  Filled 2021-10-19: qty 5, 5d supply, fill #0

## 2021-10-19 NOTE — Progress Notes (Signed)
?Deborah [name] is a 51 y.o. female with the following history as recorded in EpicCare:  ?Patient Active Problem List  ? Diagnosis Date Noted  ? Uncontrolled type 2 diabetes mellitus with diabetic neuropathy, without long-term current use of insulin 10/05/2015  ? 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 MUSCULOSKELETAL PAIN 10/01/2009  ? DYSLIPIDEMIA 12/05/2008  ? FATTY LIVER DISEASE 10/20/2008  ? NUMMULAR ECZEMA 05/22/2008  ? FEMALE INFERTILITY 05/01/2007  ? ANEMIA, IRON DEFICIENCY 04/03/2007  ? SMOKER 11/28/2006  ? HYPERTENSION, MILD 11/28/2006  ? HEMORRHOIDS, INTERNAL W/O COMPLICATION 26/71/2458  ? RHINITIS, ALLERGIC, DUE TO POLLEN 11/28/2006  ? GERD 11/28/2006  ? DERMATITIS, ATOPIC 11/28/2006  ? COCAINE ABUSE, HX OF 11/28/2006  ? LIVER FUNCTION TESTS, ABNORMAL 09/14/2006  ? GOUT 06/25/2004  ? Hand, 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  ?  ?Current Outpatient Medications  ?Medication Sig Dispense Refill  ? albuterol (VENTOLIN HFA) 108 (90 Base) MCG/ACT inhaler Inhale 2 puffs into the lungs every 6 (six) hours as needed. 18 g 0  ? allopurinol (ZYLOPRIM) 100 MG tablet TAKE 1 TABLET BY MOUTH ONCE DAILY 30 tablet 11  ? amLODipine (NORVASC) 10 MG tablet TAKE 1 TABLET BY MOUTH ONCE DAILY 30 tablet 11  ? blood glucose meter kit and supplies Dispense based on patient and insurance preference. Use up to four times daily as directed. (FOR ICD-10 E10.9, E11.9). 1 each 0  ? cetirizine (ZYRTEC) 10 MG tablet TAKE 1 TABLET BY MOUTH ONCE DAILY AS NEEDED FOR ALLERGIES 100 tablet 3  ? doxycycline (VIBRA-TABS) 100 MG tablet Take 1 tablet (100 mg total) by mouth 2 (two) times daily. 14 tablet 0  ? glipiZIDE (GLUCOTROL XL) 10 MG 24 hr tablet Take 1 tablet (10 mg total) by mouth daily with breakfast. 90 tablet 0  ? lisinopril (ZESTRIL) 10 MG tablet TAKE 1 TABLET BY MOUTH ONCE DAILY 30 tablet 11  ? metFORMIN (GLUCOPHAGE) 1000 MG tablet TAKE 1 TABLET BY MOUTH TWICE DAILY 60 tablet 11  ? predniSONE (DELTASONE) 20 MG tablet Take 1 tablet (20 mg total)  by mouth daily with breakfast. 5 tablet 0  ? Polyethylene Glycol 3350 (MIRALAX PO) Take by mouth. (Patient not taking: Reported on 10/19/2021)    ? promethazine-dextromethorphan (PROMETHAZINE-DM) 6.25-15 MG/5ML syrup Take 5 mLs by mouth 4 (four) times daily as needed for cough. 118 mL 0  ? ?No current facility-administered medications for this visit.  ?  ?Allergies: Cephalexin, Hydrocodone, Latex, Neomycin-bacitracin zn-polymyx, Sertraline, and Adhesive [tape]  ?Past Medical History:  ?Diagnosis Date  ? Allergy   ? Anemia   ? Arthritis   ? Diabetes mellitus   ? Eczema   ? GERD (gastroesophageal reflux disease)   ? Gout   ? Hyperlipidemia   ? no meds  ? Hypertension   ? Neuromuscular disorder ([place])   ? Plantar fasciitis   ? Polysubstance abuse ([place])   ? ETOH and Cocaine  ? Seasonal allergies   ?  ?Past Surgical History:  ?Procedure Laterality Date  ? ANAL RECTAL MANOMETRY N/A 04/20/2020  ? Procedure: ANO RECTAL MANOMETRY;  Surgeon: Ileana Roup, MD;  Location: [facility];  Service: General;  Laterality: N/A;  ? HEMORRHOID SURGERY    ?  ?Family History  ?Problem Relation Age of Onset  ? Hypertension Father   ? Diabetes Father   ? Cancer Father   ? Hypertension Other   ? Diabetes Other   ? Diabetes Mother   ? Hypertension Mother   ? Diabetes Maternal Grandmother   ? Hypertension  Maternal Grandmother   ? Diabetes Paternal Grandfather   ? Colon cancer Neg Hx   ? Colon polyps Neg Hx   ? Esophageal cancer Neg Hx   ? Rectal cancer Neg Hx   ? Stomach cancer Neg Hx   ?  ?Social History  ? ?Tobacco Use  ? Smoking status: Former  ?  Packs/day: 0.50  ?  Types: Cigarettes  ?  Quit date: 04/24/2013  ?  Years since quitting: 8.4  ? Smokeless tobacco: Former  ?  Quit date: 03/14/2013  ?Substance Use Topics  ? Alcohol use: No  ?  Comment: none since 03-14-13  ?  ?Subjective:  ?Was seen approximately 1 month ago and diagnosed with acute bronchitis; was treated with Z-pak; notes that symptoms have persisted; does feel that she  is noticing "rattling in her chest." ?Does feel that is "hot and sweaty"; admits that she is having continued problems with the cough at night;  ? ? ? ? ?Objective:  ?Vitals:  ? 10/19/21 1545  ?BP: 140/68  ?Pulse: 80  ?Temp: 98 ?F (36.7 ?C)  ?TempSrc: Oral  ?SpO2: 98%  ?Weight: 179 lb (81.2 kg)  ?Height: _0  (1.499 m)  ?  ?General: Well developed, well nourished, in no acute distress  ?Skin : Warm and dry.  ?Head: Normocephalic and atraumatic  ?Eyes: Sclera and conjunctiva clear; pupils round and reactive to light; extraocular movements intact  ?Ears: External normal; canals clear; tympanic membranes normal  ?Oropharynx: Pink, supple. No suspicious lesions  ?Neck: Supple without thyromegaly, adenopathy  ?Lungs: Respirations unlabored; clear to auscultation bilaterally without wheeze, rales, rhonchi  ?CVS exam: normal rate and regular rhythm.  ?Neurologic: Alert and oriented; speech intact; face symmetrical; moves all extremities well; CNII-XII intact without focal deficit  ? ?Assessment:  ?1. Chronic cough   ?2. Type 2 diabetes mellitus without complication, without long-term current use of insulin ([place])   ?  ?Plan:  ?Suspect original infection has not cleared; will update CXR today; encouraged to use Zyrtec and Flonase regularly; Rx for Doxycyline 100 mg bid x 7 days, prednisone 20 mg qd x 5 days, Promethazine DM cough syrup; work note given as requested; plan follow up with PCP in 2 weeks to re-check;  ?Will go ahead and update labs today in preparation for visit with PCP in 2 weeks;  ? ?Return in about 2 weeks (around 11/02/2021) for with Dr. Lorelei Pont for cough/ diabetes follow up.  ?Orders Placed This Encounter  ?Procedures  ? DG Chest 2 View  ?  Standing Status:   Future  ?  Standing Expiration Date:   10/20/2022  ?  Order Specific Question:   Reason for Exam (SYMPTOM  OR DIAGNOSIS REQUIRED)  ?  Answer:   chronic cough  ?  Order Specific Question:   Is patient pregnant?  ?  Answer:   No  ?  Order Specific  Question:   Preferred imaging location?  ?  Answer:   [facility]  ? CBC with Differential/Platelet  ? Comp Met (CMET)  ? Hemoglobin A1c  ?  ?Requested Prescriptions  ? ?Signed Prescriptions Disp Refills  ? doxycycline (VIBRA-TABS) 100 MG tablet 14 tablet 0  ?  Sig: Take 1 tablet (100 mg total) by mouth 2 (two) times daily.  ? predniSONE (DELTASONE) 20 MG tablet 5 tablet 0  ?  Sig: Take 1 tablet (20 mg total) by mouth daily with breakfast.  ? promethazine-dextromethorphan (PROMETHAZINE-DM) 6.25-15 MG/5ML syrup 118 mL 0  ?  Sig: Take 5 mLs by mouth 4 (four) times daily as needed for cough.  ?  ? ?

## 2021-10-20 LAB — CBC WITH DIFFERENTIAL/PLATELET
Basophils Absolute: 0.1 10*3/uL (ref 0.0–0.1)
Basophils Relative: 0.8 % (ref 0.0–3.0)
Eosinophils Absolute: 0.2 10*3/uL (ref 0.0–0.7)
Eosinophils Relative: 1.8 % (ref 0.0–5.0)
HCT: 35.2 % — ABNORMAL LOW (ref 36.0–46.0)
Hemoglobin: 11.2 g/dL — ABNORMAL LOW (ref 12.0–15.0)
Lymphocytes Relative: 25.5 % (ref 12.0–46.0)
Lymphs Abs: 2.2 10*3/uL (ref 0.7–4.0)
MCHC: 31.9 g/dL (ref 30.0–36.0)
MCV: 78.6 fl (ref 78.0–100.0)
Monocytes Absolute: 0.8 10*3/uL (ref 0.1–1.0)
Monocytes Relative: 9.2 % (ref 3.0–12.0)
Neutro Abs: 5.3 10*3/uL (ref 1.4–7.7)
Neutrophils Relative %: 62.7 % (ref 43.0–77.0)
Platelets: 248 10*3/uL (ref 150.0–400.0)
RBC: 4.47 Mil/uL (ref 3.87–5.11)
RDW: 15.5 % (ref 11.5–15.5)
WBC: 8.5 10*3/uL (ref 4.0–10.5)

## 2021-10-20 LAB — COMPREHENSIVE METABOLIC PANEL
ALT: 18 U/L (ref 0–35)
AST: 17 U/L (ref 0–37)
Albumin: 4.6 g/dL (ref 3.5–5.2)
Alkaline Phosphatase: 110 U/L (ref 39–117)
BUN: 13 mg/dL (ref 6–23)
CO2: 26 mEq/L (ref 19–32)
Calcium: 9.9 mg/dL (ref 8.4–10.5)
Chloride: 103 mEq/L (ref 96–112)
Creatinine, Ser: 0.84 mg/dL (ref 0.40–1.20)
GFR: 79.4 mL/min (ref >60.00)
Glucose, Bld: 110 mg/dL — ABNORMAL HIGH (ref 70–99)
Potassium: 4.6 mEq/L (ref 3.5–5.1)
Sodium: 139 mEq/L (ref 135–145)
Total Bilirubin: 0.2 mg/dL (ref 0.2–1.2)
Total Protein: 7.5 g/dL (ref 6.0–8.3)

## 2021-10-20 LAB — HEMOGLOBIN A1C: Hgb A1c MFr Bld: 7 % — ABNORMAL HIGH (ref 4.6–6.5)

## 2021-11-02 NOTE — Progress Notes (Unsigned)
[place] at [facility] [address] [place], [place] [address] [phone]  Date:  11/04/2021   Name:  Deborah [name]   DOB:  [birth year]   MRN:  [id number]  PCP:  Darreld Mclean, MD    Chief Complaint: No chief complaint on file.   History of Present Illness:  Deborah [name] is a 51 y.o. very pleasant female patient who presents with the following:  Patient seen today for short-term follow-up She was seen by my partner, Jodi Mourning nurse practitioner on May 16-at that time there was concern of cough.  She was given doxycycline, prednisone, chest x-ray was done.  Ordered labs for diabetes follow-up, schedule appointment for today Chest x-ray was negative  A1c showed good improvement, down from 8.7 in February Lab Results  Component Value Date   HGBA1C 7.0 (H) 10/19/2021   Allopurinol Amlodipine Lisinopril 10 Glipizide 10 once daily Metformin at thousand twice daily  Former smoker- ?  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   MUSCULOSKELETAL PAIN 10/01/2009   DYSLIPIDEMIA 12/05/2008   FATTY LIVER DISEASE 10/20/2008   NUMMULAR ECZEMA 05/22/2008   FEMALE INFERTILITY 05/01/2007   ANEMIA, IRON DEFICIENCY 04/03/2007   SMOKER 11/28/2006   HYPERTENSION, MILD 11/28/2006   HEMORRHOIDS, INTERNAL W/O COMPLICATION 50/27/7412   RHINITIS, ALLERGIC, DUE TO POLLEN 11/28/2006   GERD 11/28/2006   DERMATITIS, ATOPIC 11/28/2006   COCAINE ABUSE, HX OF 11/28/2006   LIVER FUNCTION TESTS, ABNORMAL 09/14/2006   GOUT 06/25/2004   MORTON'S NEUROMA, 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    Past Medical History:  Diagnosis Date   Allergy    Anemia    Arthritis    Diabetes mellitus    Eczema    GERD (gastroesophageal reflux disease)    Gout    Hyperlipidemia    no meds    Hypertension    Neuromuscular disorder ([place])    Plantar fasciitis    Polysubstance abuse ([place])    ETOH and Cocaine   Seasonal allergies     Past Surgical History:  Procedure Laterality Date   ANAL RECTAL MANOMETRY N/A 04/20/2020   Procedure: ANO RECTAL MANOMETRY;  Surgeon: Ileana Roup, MD;  Location: [facility];  Service: General;  Laterality: N/A;   HEMORRHOID SURGERY      Social History   Tobacco Use   Smoking status: Former    Packs/day: 0.50    Types: Cigarettes    Quit date: 04/24/2013    Years since quitting: 8.5   Smokeless tobacco: Former    Quit date: 03/14/2013  Vaping Use   Vaping Use: Never used  Substance Use Topics   Alcohol use: No    Comment: none since 03-14-13   Drug use: Not Currently    Types: Marijuana, Cocaine    Comment: Quit 04/14/2013 per pt.     Family History  Problem Relation Age of Onset   Hypertension Father    Diabetes Father    Cancer Father    Hypertension Other    Diabetes Other    Diabetes Mother    Hypertension Mother    Diabetes Maternal Grandmother    Hypertension Maternal Grandmother    Diabetes Paternal Grandfather    Colon cancer Neg Hx    Colon polyps Neg Hx    Esophageal cancer Neg  Hx    Rectal cancer Neg Hx    Stomach cancer Neg Hx     Allergies  Allergen Reactions   Cephalexin     REACTION: stomach cramps and hives   Hydrocodone     headache   Latex    Neomycin-Bacitracin Zn-Polymyx     REACTION: burning   Sertraline Hives and Itching   Adhesive [Tape] Hives and Rash    Medication list has been reviewed and updated.  Current Outpatient Medications on File Prior to Visit  Medication Sig Dispense Refill   albuterol (VENTOLIN HFA) 108 (90 Base) MCG/ACT inhaler Inhale 2 puffs into the lungs every 6 (six) hours as needed. 18 g 0   allopurinol (ZYLOPRIM) 100 MG tablet TAKE 1 TABLET BY MOUTH ONCE DAILY 30 tablet 11   amLODipine (NORVASC) 10 MG tablet TAKE 1 TABLET BY MOUTH ONCE DAILY 30 tablet 11    blood glucose meter kit and supplies Dispense based on patient and insurance preference. Use up to four times daily as directed. (FOR ICD-10 E10.9, E11.9). 1 each 0   cetirizine (ZYRTEC) 10 MG tablet TAKE 1 TABLET BY MOUTH ONCE DAILY AS NEEDED FOR ALLERGIES 100 tablet 3   doxycycline (VIBRA-TABS) 100 MG tablet Take 1 tablet (100 mg total) by mouth 2 (two) times daily. 14 tablet 0   glipiZIDE (GLUCOTROL XL) 10 MG 24 hr tablet Take 1 tablet (10 mg total) by mouth daily with breakfast. 90 tablet 0   lisinopril (ZESTRIL) 10 MG tablet TAKE 1 TABLET BY MOUTH ONCE DAILY 30 tablet 11   metFORMIN (GLUCOPHAGE) 1000 MG tablet TAKE 1 TABLET BY MOUTH TWICE DAILY 60 tablet 11   Polyethylene Glycol 3350 (MIRALAX PO) Take by mouth. (Patient not taking: Reported on 10/19/2021)     predniSONE (DELTASONE) 20 MG tablet Take 1 tablet (20 mg total) by mouth daily with breakfast. 5 tablet 0   promethazine-dextromethorphan (PROMETHAZINE-DM) 6.25-15 MG/5ML syrup Take 5 mLs by mouth 4 (four) times daily as needed for cough. 118 mL 0   No current facility-administered medications on file prior to visit.    Review of Systems:  As per HPI- otherwise negative.   Physical Examination: There were no vitals filed for this visit. There were no vitals filed for this visit. There is no height or weight on file to calculate BMI. Ideal Body Weight:    GEN: no acute distress. HEENT: Atraumatic, Normocephalic.  Ears and Nose: No external deformity. CV: RRR, No M/G/R. No JVD. No thrill. No extra heart sounds. PULM: CTA B, no wheezes, crackles, rhonchi. No retractions. No resp. distress. No accessory muscle use. ABD: S, NT, ND, +BS. No rebound. No HSM. EXTR: No c/c/e PSYCH: Normally interactive. Conversant.    Assessment and Plan: ***  Signed Lamar Blinks, MD

## 2021-11-02 NOTE — Patient Instructions (Incomplete)
It was good to see you again today, great job with your A1c recently!   We will set you up for a lung cancer screening CT Refilled your cough syrup to use as needed Add atrovent nasal spray as needed for post -nasal drainage   I hope this will clear up your cough but please let me know if you are not making progress here soon

## 2021-11-04 ENCOUNTER — Other Ambulatory Visit (HOSPITAL_BASED_OUTPATIENT_CLINIC_OR_DEPARTMENT_OTHER): Payer: Self-pay

## 2021-11-04 ENCOUNTER — Ambulatory Visit: Payer: Managed Care, Other (non HMO) | Admitting: Family Medicine

## 2021-11-04 VITALS — BP 130/80 | HR 79 | Temp 97.9°F | Resp 18 | Ht 59.0 in | Wt 179.0 lb

## 2021-11-04 DIAGNOSIS — I10 Essential (primary) hypertension: Secondary | ICD-10-CM

## 2021-11-04 DIAGNOSIS — Z122 Encounter for screening for malignant neoplasm of respiratory organs: Secondary | ICD-10-CM | POA: Diagnosis not present

## 2021-11-04 DIAGNOSIS — E119 Type 2 diabetes mellitus without complications: Secondary | ICD-10-CM | POA: Diagnosis not present

## 2021-11-04 DIAGNOSIS — J3089 Other allergic rhinitis: Secondary | ICD-10-CM

## 2021-11-04 DIAGNOSIS — R0982 Postnasal drip: Secondary | ICD-10-CM

## 2021-11-04 DIAGNOSIS — Z87891 Personal history of nicotine dependence: Secondary | ICD-10-CM

## 2021-11-04 DIAGNOSIS — E118 Type 2 diabetes mellitus with unspecified complications: Secondary | ICD-10-CM

## 2021-11-04 DIAGNOSIS — R052 Subacute cough: Secondary | ICD-10-CM

## 2021-11-04 DIAGNOSIS — E782 Mixed hyperlipidemia: Secondary | ICD-10-CM

## 2021-11-04 MED ORDER — IPRATROPIUM BROMIDE 0.06 % NA SOLN
2.0000 | Freq: Three times a day (TID) | NASAL | 12 refills | Status: DC
  Filled 2021-11-04: qty 15, 25d supply, fill #0

## 2021-11-04 MED ORDER — METFORMIN HCL 1000 MG PO TABS
ORAL_TABLET | Freq: Two times a day (BID) | ORAL | 3 refills | Status: DC
  Filled 2021-11-04: qty 180, 90d supply, fill #0
  Filled 2022-02-05: qty 180, 90d supply, fill #1
  Filled 2022-05-04: qty 180, 90d supply, fill #2
  Filled 2022-08-18 (×2): qty 180, 90d supply, fill #3

## 2021-11-04 MED ORDER — PROMETHAZINE-DM 6.25-15 MG/5ML PO SYRP
5.0000 mL | ORAL_SOLUTION | Freq: Four times a day (QID) | ORAL | 0 refills | Status: DC | PRN
  Filled 2021-11-04: qty 118, 6d supply, fill #0

## 2021-11-04 MED ORDER — GLIPIZIDE ER 10 MG PO TB24
10.0000 mg | ORAL_TABLET | Freq: Every day | ORAL | 3 refills | Status: DC
  Filled 2021-11-04: qty 90, 90d supply, fill #0
  Filled 2022-01-22: qty 90, 90d supply, fill #1
  Filled 2022-05-01: qty 90, 90d supply, fill #2

## 2021-11-04 MED ORDER — FLUTICASONE PROPIONATE 50 MCG/ACT NA SUSP
2.0000 | Freq: Every day | NASAL | 12 refills | Status: DC
  Filled 2021-11-04: qty 16, 30d supply, fill #0
  Filled 2022-01-09: qty 16, 30d supply, fill #1

## 2021-11-05 ENCOUNTER — Telehealth: Payer: Self-pay | Admitting: Family Medicine

## 2021-11-05 ENCOUNTER — Telehealth (HOSPITAL_BASED_OUTPATIENT_CLINIC_OR_DEPARTMENT_OTHER): Payer: Self-pay

## 2021-11-05 NOTE — Telephone Encounter (Signed)
Scan is already precerted and scheduled for this pt.

## 2021-11-05 NOTE — Telephone Encounter (Signed)
Deborah [name] from [facility] called to let us know that the CT of the thorax low dose 71271 needs to be sent to [facility]. Fax [phone]. Please advise.

## 2021-11-08 NOTE — Telephone Encounter (Signed)
Message was sent to [facility] to help me sort out the issue. Will follow up with the pt as soon as I hear something.

## 2021-11-09 NOTE — Telephone Encounter (Signed)
Pt is aware and will call and schedule. Order faxed.

## 2021-11-09 NOTE — Telephone Encounter (Signed)
Called [facility] -agent stated they had called the pt to schedule CT, but it have not connected with her as of yet. Deborah [name]- can you please ask her to call them back - I was assured they will schedule her CT scan  [phone]   Thank you!

## 2021-11-09 NOTE — Telephone Encounter (Signed)
Patient states she is not sure what she is supposed to about her appt tomorrow. She would like a call back to know what her next step is. Please advise.

## 2021-11-09 NOTE — Telephone Encounter (Signed)
Called insurance and the CT was approved (Approval number: [id number]) I asked if their was a specific location where this CT should be done at and they said it is not. (Call ref number: [id number]) im really puzzled as to why Imaging canceled her appointment and unsure what to do at this point. Tried calling imaging- no answer.   Please advise.

## 2021-11-09 NOTE — Telephone Encounter (Signed)
Good morning- have you heard anything about this? If not I can look into it.

## 2021-11-10 ENCOUNTER — Ambulatory Visit (HOSPITAL_BASED_OUTPATIENT_CLINIC_OR_DEPARTMENT_OTHER): Payer: Managed Care, Other (non HMO)

## 2021-11-20 ENCOUNTER — Other Ambulatory Visit: Payer: Self-pay | Admitting: Family Medicine

## 2021-11-22 ENCOUNTER — Other Ambulatory Visit (HOSPITAL_BASED_OUTPATIENT_CLINIC_OR_DEPARTMENT_OTHER): Payer: Self-pay

## 2021-11-22 MED ORDER — CETIRIZINE HCL 10 MG PO TABS
ORAL_TABLET | ORAL | 3 refills | Status: DC
  Filled 2021-11-22: qty 100, 100d supply, fill #0
  Filled 2022-02-24: qty 100, 100d supply, fill #1
  Filled 2022-06-09: qty 100, 90d supply, fill #2
  Filled 2022-09-24: qty 100, 90d supply, fill #3

## 2021-11-23 ENCOUNTER — Other Ambulatory Visit (HOSPITAL_BASED_OUTPATIENT_CLINIC_OR_DEPARTMENT_OTHER): Payer: Self-pay

## 2021-11-23 ENCOUNTER — Other Ambulatory Visit: Payer: Self-pay | Admitting: Family Medicine

## 2021-11-23 ENCOUNTER — Telehealth: Payer: Self-pay | Admitting: Family Medicine

## 2021-11-23 DIAGNOSIS — R052 Subacute cough: Secondary | ICD-10-CM

## 2021-11-23 MED ORDER — PROMETHAZINE-DM 6.25-15 MG/5ML PO SYRP
5.0000 mL | ORAL_SOLUTION | Freq: Four times a day (QID) | ORAL | 0 refills | Status: DC | PRN
  Filled 2021-11-23: qty 118, 6d supply, fill #0

## 2021-11-23 NOTE — Telephone Encounter (Signed)
Rx #: [id number]  promethazine-dextromethorphan (PROMETHAZINE-DM) 6.25-15 MG/5ML syrup [[id number]]

## 2021-11-23 NOTE — Telephone Encounter (Signed)
DOD:  Requesting: Promethazine DM cough syrup Contract: none UDS: none Last Visit: 11/04/21 Next Visit: 01/26/22 Last Refill: 11/04/21  Please Advise

## 2022-01-10 ENCOUNTER — Other Ambulatory Visit (HOSPITAL_BASED_OUTPATIENT_CLINIC_OR_DEPARTMENT_OTHER): Payer: Self-pay

## 2022-01-22 ENCOUNTER — Other Ambulatory Visit: Payer: Self-pay | Admitting: Family Medicine

## 2022-01-22 DIAGNOSIS — R052 Subacute cough: Secondary | ICD-10-CM

## 2022-01-24 ENCOUNTER — Other Ambulatory Visit (HOSPITAL_BASED_OUTPATIENT_CLINIC_OR_DEPARTMENT_OTHER): Payer: Self-pay

## 2022-01-24 MED ORDER — PROMETHAZINE-DM 6.25-15 MG/5ML PO SYRP
5.0000 mL | ORAL_SOLUTION | Freq: Four times a day (QID) | ORAL | 0 refills | Status: DC | PRN
  Filled 2022-01-24: qty 118, 6d supply, fill #0

## 2022-01-24 NOTE — Progress Notes (Unsigned)
[facility] [place] at [facility] [address], [place], [place] [address] [phone]  Date:  01/26/2022   Name:  Deborah [name]   DOB:  [birth year]   MRN:  [id number]  PCP:  Darreld Mclean, MD    Chief Complaint: No chief complaint on file.   History of Present Illness:  Deborah [name] is a 51 y.o. very pleasant female patient who presents with the following:  Patient seen today to discuss immunizations History of diabetes, hypertension, fatty liver She is enrolling in [employer] and is missing some required immunization records  Per notes, she needs to have her Tdap up-to-date Polio series if if younger than 19 MMR Hepatitis B; however this is not apply as patient was born before [birth year] Varicella; this also does not apply as she was born before 2003 Lab Results  Component Value Date   HGBA1C 7.0 (H) 10/19/2021     Patient Active Problem List   Diagnosis Date Noted   Uncontrolled type 2 diabetes mellitus with diabetic neuropathy, without long-term current use of insulin 10/05/2015   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   MUSCULOSKELETAL PAIN 10/01/2009   DYSLIPIDEMIA 12/05/2008   FATTY LIVER DISEASE 10/20/2008   NUMMULAR ECZEMA 05/22/2008   FEMALE INFERTILITY 05/01/2007   ANEMIA, IRON DEFICIENCY 04/03/2007   SMOKER 11/28/2006   HYPERTENSION, MILD 11/28/2006   HEMORRHOIDS, INTERNAL W/O COMPLICATION 39/08/90   RHINITIS, ALLERGIC, DUE TO POLLEN 11/28/2006   GERD 11/28/2006   DERMATITIS, ATOPIC 11/28/2006   COCAINE ABUSE, HX OF 11/28/2006   LIVER FUNCTION TESTS, ABNORMAL 09/14/2006   GOUT 06/25/2004   MORTON'S NEUROMA, 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    Past Medical History:  Diagnosis Date   Allergy    Anemia    Arthritis    Diabetes mellitus    Eczema    GERD (gastroesophageal reflux disease)    Gout    Hyperlipidemia    no meds   Hypertension    Neuromuscular disorder ([place])    Plantar fasciitis    Polysubstance abuse ([place])     ETOH and Cocaine   Seasonal allergies     Past Surgical History:  Procedure Laterality Date   ANAL RECTAL MANOMETRY N/A 04/20/2020   Procedure: ANO RECTAL MANOMETRY;  Surgeon: Ileana Roup, MD;  Location: [facility];  Service: General;  Laterality: N/A;   HEMORRHOID SURGERY      Social History   Tobacco Use   Smoking status: Former    Packs/day: 0.50    Types: Cigarettes    Quit date: 04/24/2013    Years since quitting: 8.7   Smokeless tobacco: Former    Quit date: 03/14/2013  Vaping Use   Vaping Use: Never used  Substance Use Topics   Alcohol use: No    Comment: none since 03-14-13   Drug use: Not Currently    Types: Marijuana, Cocaine    Comment: Quit 04/14/2013 per pt.     Family History  Problem Relation Age of Onset   Hypertension Father    Diabetes Father    Cancer Father    Hypertension Other    Diabetes Other    Diabetes Mother    Hypertension Mother    Diabetes Maternal Grandmother    Hypertension Maternal Grandmother    Diabetes Paternal Grandfather    Colon cancer Neg Hx    Colon polyps Neg Hx    Esophageal cancer Neg Hx    Rectal cancer Neg Hx  Stomach cancer Neg Hx     Allergies  Allergen Reactions   Cephalexin     REACTION: stomach cramps and hives   Hydrocodone     headache   Latex    Neomycin-Bacitracin Zn-Polymyx     REACTION: burning   Sertraline Hives and Itching   Adhesive [Tape] Hives and Rash    Medication list has been reviewed and updated.  Current Outpatient Medications on File Prior to Visit  Medication Sig Dispense Refill   promethazine-dextromethorphan (PROMETHAZINE-DM) 6.25-15 MG/5ML syrup Take 5 mLs by mouth 4 (four) times daily as needed for cough. 118 mL 0   albuterol (VENTOLIN HFA) 108 (90 Base) MCG/ACT inhaler Inhale 2 puffs into the lungs every 6 (six) hours as needed. 18 g 0   allopurinol (ZYLOPRIM) 100 MG tablet TAKE 1 TABLET BY MOUTH ONCE DAILY 30 tablet 11   amLODipine (NORVASC) 10 MG tablet TAKE 1  TABLET BY MOUTH ONCE DAILY 30 tablet 11   blood glucose meter kit and supplies Dispense based on patient and insurance preference. Use up to four times daily as directed. (FOR ICD-10 E10.9, E11.9). 1 each 0   cetirizine (ZYRTEC) 10 MG tablet TAKE 1 TABLET BY MOUTH ONCE DAILY AS NEEDED FOR ALLERGIES 100 tablet 3   fluticasone (FLONASE) 50 MCG/ACT nasal spray Place 2 sprays into both nostrils daily. 16 g 12   glipiZIDE (GLUCOTROL XL) 10 MG 24 hr tablet Take 1 tablet (10 mg total) by mouth daily with breakfast. 90 tablet 3   ipratropium (ATROVENT) 0.06 % nasal spray Place 2 sprays into both nostrils 3 (three) times daily. Use as needed for post nasal drainage 15 mL 12   lisinopril (ZESTRIL) 10 MG tablet TAKE 1 TABLET BY MOUTH ONCE DAILY 30 tablet 11   metFORMIN (GLUCOPHAGE) 1000 MG tablet TAKE 1 TABLET BY MOUTH TWICE DAILY 180 tablet 3   Polyethylene Glycol 3350 (MIRALAX PO) Take by mouth.     No current facility-administered medications on file prior to visit.    Review of Systems:  As per HPI- otherwise negative.   Physical Examination: There were no vitals filed for this visit. There were no vitals filed for this visit. There is no height or weight on file to calculate BMI. Ideal Body Weight:    GEN: no acute distress. HEENT: Atraumatic, Normocephalic.  Ears and Nose: No external deformity. CV: RRR, No M/G/R. No JVD. No thrill. No extra heart sounds. PULM: CTA B, no wheezes, crackles, rhonchi. No retractions. No resp. distress. No accessory muscle use. ABD: S, NT, ND, +BS. No rebound. No HSM. EXTR: No c/c/e PSYCH: Normally interactive. Conversant.    Assessment and Plan: ***  Signed Lamar Blinks, MD

## 2022-01-26 ENCOUNTER — Ambulatory Visit: Payer: Managed Care, Other (non HMO) | Admitting: Family Medicine

## 2022-01-26 ENCOUNTER — Other Ambulatory Visit (HOSPITAL_BASED_OUTPATIENT_CLINIC_OR_DEPARTMENT_OTHER): Payer: Self-pay

## 2022-01-26 VITALS — BP 144/80 | HR 86 | Temp 97.7°F | Resp 18 | Ht 59.0 in | Wt 180.8 lb

## 2022-01-26 DIAGNOSIS — K219 Gastro-esophageal reflux disease without esophagitis: Secondary | ICD-10-CM | POA: Diagnosis not present

## 2022-01-26 DIAGNOSIS — Z20828 Contact with and (suspected) exposure to other viral communicable diseases: Secondary | ICD-10-CM | POA: Diagnosis not present

## 2022-01-26 MED ORDER — DEXLANSOPRAZOLE 60 MG PO CPDR
60.0000 mg | DELAYED_RELEASE_CAPSULE | Freq: Every day | ORAL | 4 refills | Status: DC
  Filled 2022-01-26: qty 30, 30d supply, fill #0
  Filled 2022-02-17: qty 30, 30d supply, fill #1

## 2022-01-26 MED ORDER — SUCRALFATE 1 G PO TABS
1.0000 g | ORAL_TABLET | Freq: Three times a day (TID) | ORAL | 0 refills | Status: DC
  Filled 2022-01-26: qty 40, 10d supply, fill #0

## 2022-01-26 NOTE — Patient Instructions (Addendum)
Good to see you today!  For school- we need to do an "MMR titer" for you to make sure you are immune to measles, mumps and rubella.  Assuming you are immune you are all caught up!  Your tetanus is up to date   Ok to do a covid booster now and flu shot this fall  For reflux- please give your GI doctor a call- Dr Silverio Decamp In the meantime let's add -carafate with meals and at bedtime for 10 days -Dexilant for acid daily

## 2022-01-27 ENCOUNTER — Encounter: Payer: Self-pay | Admitting: Family Medicine

## 2022-01-27 LAB — MEASLES/MUMPS/RUBELLA IMMUNITY
Mumps IgG: 254 AU/mL
Rubella: 18.2 Index
Rubeola IgG: 35.8 AU/mL

## 2022-02-03 ENCOUNTER — Telehealth: Payer: Self-pay | Admitting: Family Medicine

## 2022-02-03 NOTE — Telephone Encounter (Signed)
Patient called to see if we could provide proof that she has had her Tetanus and MMR titer. She said she can pick a copy of it up before 11:15 am today. It can be faxed to [phone]; Attn: Immunizations

## 2022-02-03 NOTE — Telephone Encounter (Signed)
Faxed

## 2022-02-03 NOTE — Telephone Encounter (Signed)
Patient said her school is telling her she needs a Tetanus shot before 02/10/2022 or she will be unenrolled from her classes. Please call patient to advise when she is able to be scheduled.

## 2022-02-03 NOTE — Telephone Encounter (Signed)
Pt meant to fax it to [phone].

## 2022-02-03 NOTE — Telephone Encounter (Signed)
Form faxed

## 2022-02-03 NOTE — Telephone Encounter (Signed)
Faxed a letter to the school that her last tdap was 04/20/2016- meaning she is due in 2027.

## 2022-02-07 ENCOUNTER — Other Ambulatory Visit (HOSPITAL_BASED_OUTPATIENT_CLINIC_OR_DEPARTMENT_OTHER): Payer: Self-pay

## 2022-02-08 ENCOUNTER — Other Ambulatory Visit (HOSPITAL_BASED_OUTPATIENT_CLINIC_OR_DEPARTMENT_OTHER): Payer: Self-pay

## 2022-02-17 ENCOUNTER — Other Ambulatory Visit (HOSPITAL_BASED_OUTPATIENT_CLINIC_OR_DEPARTMENT_OTHER): Payer: Self-pay

## 2022-02-17 ENCOUNTER — Other Ambulatory Visit: Payer: Self-pay | Admitting: Family Medicine

## 2022-02-17 DIAGNOSIS — K219 Gastro-esophageal reflux disease without esophagitis: Secondary | ICD-10-CM

## 2022-02-17 MED ORDER — SUCRALFATE 1 G PO TABS
1.0000 g | ORAL_TABLET | Freq: Three times a day (TID) | ORAL | 0 refills | Status: DC
  Filled 2022-02-17: qty 40, 10d supply, fill #0

## 2022-02-25 ENCOUNTER — Other Ambulatory Visit (HOSPITAL_BASED_OUTPATIENT_CLINIC_OR_DEPARTMENT_OTHER): Payer: Self-pay

## 2022-03-03 NOTE — Patient Instructions (Addendum)
It was good to see you again today, I will be in touch with your A1c and other labs, pap asap Flu shot today Recommend covid shot this fall at your convenience  Refilled your Dexilant for daily use and carafate for you to use as needed I do recommend calling your GI doctor as well to set up a recheck visit Dr Silverio Decamp Located in: [facility] Address: [address] [place], [place], [place] [address] Phone: [phone]

## 2022-03-03 NOTE — Progress Notes (Signed)
[facility] [place], [place], [place] [address] [phone]  Date:  03/07/2022   Name:  Deborah [name]   DOB:  10-Apr-1971   MRN:  [id number]  PCP:  Darreld Mclean, MD    Chief Complaint: 4 month follow up (Concerns/ questions: pt wants to continue her new reflux medication/Flu shot today: yes/Urine Albumin due today)   History of Present Illness:  Deborah [name] is a 51 y.o. very pleasant female patient who presents with the following:  Patient seen today for periodic follow-up-history of diabetes, hypertension, fatty liver Most recent visit with myself was last month, in August-she need to update her immunization records for [employer] At her last visit she was struggling with GERD.  She had tried several PPIs, I prescribed Dexilant and Carafate She notes the Dexialnt is helping her-she continues to take this daily.  She wonders if she could have some Carafate to have on hand for as needed use She is off OTCs for reflux  Lab Results  Component Value Date   HGBA1C 7.0 (H) 10/19/2021   Can update A1c today-most recent A1c showed improvement  Urine microalbumin-update today Recommend COVID-vaccine Flu shot- give today  Can update Pap  She is in school at [employer] and also working part time this semester  She is studying social work She is also celebrating 9 years of sobriety Patient Active Problem List   Diagnosis Date Noted   Uncontrolled type 2 diabetes mellitus with diabetic neuropathy, without long-term current use of insulin 10/05/2015   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   MUSCULOSKELETAL PAIN 10/01/2009   DYSLIPIDEMIA 12/05/2008   FATTY LIVER DISEASE 10/20/2008   NUMMULAR ECZEMA 05/22/2008   FEMALE INFERTILITY 05/01/2007   ANEMIA, IRON DEFICIENCY 04/03/2007   SMOKER 11/28/2006   HYPERTENSION, MILD 11/28/2006   HEMORRHOIDS, INTERNAL W/O COMPLICATION 79/15/0569   RHINITIS, ALLERGIC, DUE TO POLLEN  11/28/2006   GERD 11/28/2006   DERMATITIS, ATOPIC 11/28/2006   COCAINE ABUSE, HX OF 11/28/2006   LIVER FUNCTION TESTS, ABNORMAL 09/14/2006   GOUT 06/25/2004   MORTON'S NEUROMA, 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    Past Medical History:  Diagnosis Date   Allergy    Anemia    Arthritis    Diabetes mellitus    Eczema    GERD (gastroesophageal reflux disease)    Gout    Hyperlipidemia    no meds   Hypertension    Neuromuscular disorder ([place])    Plantar fasciitis    Polysubstance abuse ([place])    ETOH and Cocaine   Seasonal allergies     Past Surgical History:  Procedure Laterality Date   ANAL RECTAL MANOMETRY N/A 04/20/2020   Procedure: ANO RECTAL MANOMETRY;  Surgeon: Ileana Roup, MD;  Location: [facility];  Service: General;  Laterality: N/A;   HEMORRHOID SURGERY      Social History   Tobacco Use   Smoking status: Former    Packs/day: 0.50    Types: Cigarettes    Quit date: 04/24/2013    Years since quitting: 8.8   Smokeless tobacco: Former    Quit date: 03/14/2013  Vaping Use   Vaping Use: Never used  Substance Use Topics   Alcohol use: No    Comment: none since 03-14-13   Drug use: Not Currently    Types: Marijuana, Cocaine    Comment: Quit 04/14/2013 per pt.     Family History  Problem Relation Age of Onset  Hypertension Father    Diabetes Father    Cancer Father    Hypertension Other    Diabetes Other    Diabetes Mother    Hypertension Mother    Diabetes Maternal Grandmother    Hypertension Maternal Grandmother    Diabetes Paternal Grandfather    Colon cancer Neg Hx    Colon polyps Neg Hx    Esophageal cancer Neg Hx    Rectal cancer Neg Hx    Stomach cancer Neg Hx     Allergies  Allergen Reactions   Cephalexin     REACTION: stomach cramps and hives   Hydrocodone     headache   Latex    Neomycin-Bacitracin Zn-Polymyx     REACTION: burning   Sertraline Hives and Itching   Adhesive [Tape] Hives and Rash    Medication list has been reviewed  and updated.  Current Outpatient Medications on File Prior to Visit  Medication Sig Dispense Refill   allopurinol (ZYLOPRIM) 100 MG tablet TAKE 1 TABLET BY MOUTH ONCE DAILY 30 tablet 11   amLODipine (NORVASC) 10 MG tablet TAKE 1 TABLET BY MOUTH ONCE DAILY 30 tablet 11   blood glucose meter kit and supplies Dispense based on patient and insurance preference. Use up to four times daily as directed. (FOR ICD-10 E10.9, E11.9). 1 each 0   cetirizine (ZYRTEC) 10 MG tablet TAKE 1 TABLET BY MOUTH ONCE DAILY AS NEEDED FOR ALLERGIES 100 tablet 3   dexlansoprazole (DEXILANT) 60 MG capsule Take 1 capsule (60 mg total) by mouth daily. 30 capsule 4   fluticasone (FLONASE) 50 MCG/ACT nasal spray Place 2 sprays into both nostrils daily. 16 g 12   glipiZIDE (GLUCOTROL XL) 10 MG 24 hr tablet Take 1 tablet (10 mg total) by mouth daily with breakfast. 90 tablet 3   lisinopril (ZESTRIL) 10 MG tablet TAKE 1 TABLET BY MOUTH ONCE DAILY 30 tablet 11   metFORMIN (GLUCOPHAGE) 1000 MG tablet TAKE 1 TABLET BY MOUTH TWICE DAILY 180 tablet 3   promethazine-dextromethorphan (PROMETHAZINE-DM) 6.25-15 MG/5ML syrup Take 5 mLs by mouth 4 (four) times daily as needed for cough. 118 mL 0   sucralfate (CARAFATE) 1 g tablet Take 1 tablet (1 g total) by mouth 4 (four) times daily -  with meals and at bedtime. 40 tablet 0   No current facility-administered medications on file prior to visit.    Review of Systems:  As per HPI- otherwise negative.   Physical Examination: Vitals:   03/07/22 0849  BP: 138/80  Pulse: 64  Resp: 18  Temp: 97.9 F (36.6 C)  SpO2: 99%   Vitals:   03/07/22 0849  Weight: 178 lb (80.7 kg)  Height: 4' 11"  (1.499 m)   Body mass index is 35.95 kg/m. Ideal Body Weight: Weight in (lb) to have BMI = 25: 123.5  GEN: no acute distress.  Obese, looks well  HEENT: Atraumatic, Normocephalic.  Ears and Nose: No external deformity. CV: RRR, No M/G/R. No JVD. No thrill. No extra heart sounds. PULM: CTA  B, no wheezes, crackles, rhonchi. No retractions. No resp. distress. No accessory muscle use. ABD: S, NT, ND, +BS. No rebound. No HSM. EXTR: No c/c/e PSYCH: Normally interactive. Conversant.  Pelvic: normal, no vaginal lesions or discharge. Uterus normal, no CMT, no adnexal tendereness or masses   Assessment and Plan: Screening for cervical cancer - Plan: Cytology - PAP  Gastroesophageal reflux disease, unspecified whether esophagitis present - Plan: dexlansoprazole (DEXILANT) 60 MG capsule, sucralfate (CARAFATE) 1  g tablet  Type 2 diabetes mellitus without complication, without long-term current use of insulin (HCC) - Plan: Hemoglobin A1c, Microalbumin / creatinine urine ratio, Basic metabolic panel  HYPERTENSION, MILD - Plan: CBC  Need for influenza vaccination - Plan: Flu Vaccine QUAD 6+ mos PF IM (Fluarix Quad PF)  Seen today for follow-up.  Pap and labs pending as above Her GERD symptoms are much improved.  Continue Dexilant and refill Carafate to use as needed.  I encouraged her to follow-up with her gastroenterologist Dr. Silverio Decamp and provided contact information Flu shot given Will plan further follow- up pending labs.   Signed Lamar Blinks, MD  Received labs as below, message to patient Results for orders placed or performed in visit on 03/07/22  Hemoglobin A1c  Result Value Ref Range   Hgb A1c MFr Bld 7.3 (H) 4.6 - 6.5 %  Microalbumin / creatinine urine ratio  Result Value Ref Range   Microalb, Ur 1.4 0.0 - 1.9 mg/dL   Creatinine,U 183.3 mg/dL   Microalb Creat Ratio 0.8 0.0 - 30.0 mg/g  Basic metabolic panel  Result Value Ref Range   Sodium 140 135 - 145 mEq/L   Potassium 4.1 3.5 - 5.1 mEq/L   Chloride 104 96 - 112 mEq/L   CO2 26 19 - 32 mEq/L   Glucose, Bld 136 (H) 70 - 99 mg/dL   BUN 12 6 - 23 mg/dL   Creatinine, Ser 0.75 0.40 - 1.20 mg/dL   GFR 90.72 >60.00 mL/min   Calcium 9.3 8.4 - 10.5 mg/dL  CBC  Result Value Ref Range   WBC 7.3 4.0 - 10.5 K/uL    RBC 4.37 3.87 - 5.11 Mil/uL   Platelets 266.0 150.0 - 400.0 K/uL   Hemoglobin 10.8 (L) 12.0 - 15.0 g/dL   HCT 33.6 (L) 36.0 - 46.0 %   MCV 76.8 (L) 78.0 - 100.0 fl   MCHC 32.2 30.0 - 36.0 g/dL   RDW 15.9 (H) 11.5 - 15.5 %

## 2022-03-07 ENCOUNTER — Encounter: Payer: Self-pay | Admitting: Family Medicine

## 2022-03-07 ENCOUNTER — Ambulatory Visit: Payer: Managed Care, Other (non HMO) | Admitting: Family Medicine

## 2022-03-07 ENCOUNTER — Other Ambulatory Visit (HOSPITAL_COMMUNITY)
Admission: RE | Admit: 2022-03-07 | Discharge: 2022-03-07 | Disposition: A | Discharge status: Home / Self Care | Payer: Managed Care, Other (non HMO) | Source: Ambulatory Visit | Attending: Family Medicine | Admitting: Family Medicine

## 2022-03-07 VITALS — BP 138/80 | HR 64 | Temp 97.9°F | Resp 18 | Ht 59.0 in | Wt 178.0 lb

## 2022-03-07 DIAGNOSIS — Z23 Encounter for immunization: Secondary | ICD-10-CM

## 2022-03-07 DIAGNOSIS — E119 Type 2 diabetes mellitus without complications: Secondary | ICD-10-CM

## 2022-03-07 DIAGNOSIS — I10 Essential (primary) hypertension: Secondary | ICD-10-CM | POA: Diagnosis not present

## 2022-03-07 DIAGNOSIS — Z124 Encounter for screening for malignant neoplasm of cervix: Secondary | ICD-10-CM | POA: Insufficient documentation

## 2022-03-07 DIAGNOSIS — K219 Gastro-esophageal reflux disease without esophagitis: Secondary | ICD-10-CM

## 2022-03-07 LAB — CBC
HCT: 33.6 % — ABNORMAL LOW (ref 36.0–46.0)
Hemoglobin: 10.8 g/dL — ABNORMAL LOW (ref 12.0–15.0)
MCHC: 32.2 g/dL (ref 30.0–36.0)
MCV: 76.8 fl — ABNORMAL LOW (ref 78.0–100.0)
Platelets: 266 10*3/uL (ref 150.0–400.0)
RBC: 4.37 Mil/uL (ref 3.87–5.11)
RDW: 15.9 % — ABNORMAL HIGH (ref 11.5–15.5)
WBC: 7.3 10*3/uL (ref 4.0–10.5)

## 2022-03-07 LAB — HEMOGLOBIN A1C: Hgb A1c MFr Bld: 7.3 % — ABNORMAL HIGH (ref 4.6–6.5)

## 2022-03-07 LAB — BASIC METABOLIC PANEL
BUN: 12 mg/dL (ref 6–23)
CO2: 26 mEq/L (ref 19–32)
Calcium: 9.3 mg/dL (ref 8.4–10.5)
Chloride: 104 mEq/L (ref 96–112)
Creatinine, Ser: 0.75 mg/dL (ref 0.40–1.20)
GFR: 90.72 mL/min (ref >60.00)
Glucose, Bld: 136 mg/dL — ABNORMAL HIGH (ref 70–99)
Potassium: 4.1 mEq/L (ref 3.5–5.1)
Sodium: 140 mEq/L (ref 135–145)

## 2022-03-07 LAB — MICROALBUMIN / CREATININE URINE RATIO
Creatinine,U: 183.3 mg/dL
Microalb Creat Ratio: 0.8 mg/g (ref 0.0–30.0)
Microalb, Ur: 1.4 mg/dL (ref 0.0–1.9)

## 2022-03-07 MED ORDER — DEXLANSOPRAZOLE 60 MG PO CPDR
60.0000 mg | DELAYED_RELEASE_CAPSULE | Freq: Every day | ORAL | 3 refills | Status: DC
  Filled 2022-03-09: qty 90, 90d supply, fill #0
  Filled 2022-03-11: qty 30, 30d supply, fill #0

## 2022-03-07 MED ORDER — SUCRALFATE 1 G PO TABS: 1.0000 g | ORAL_TABLET | Freq: Two times a day (BID) | ORAL | 1 refills | Status: DC

## 2022-03-08 ENCOUNTER — Encounter: Payer: Self-pay | Admitting: Family Medicine

## 2022-03-08 ENCOUNTER — Other Ambulatory Visit (INDEPENDENT_AMBULATORY_CARE_PROVIDER_SITE_OTHER): Payer: Managed Care, Other (non HMO)

## 2022-03-08 DIAGNOSIS — D509 Iron deficiency anemia, unspecified: Secondary | ICD-10-CM

## 2022-03-08 LAB — FERRITIN: Ferritin: 8.9 ng/mL — ABNORMAL LOW (ref 10.0–291.0)

## 2022-03-09 ENCOUNTER — Telehealth: Payer: Self-pay | Admitting: Family Medicine

## 2022-03-09 ENCOUNTER — Other Ambulatory Visit (HOSPITAL_BASED_OUTPATIENT_CLINIC_OR_DEPARTMENT_OTHER): Payer: Self-pay

## 2022-03-09 LAB — CYTOLOGY - PAP
Comment: NEGATIVE
Diagnosis: NEGATIVE
High risk HPV: NEGATIVE

## 2022-03-09 NOTE — Telephone Encounter (Signed)
Pt is wanting to know if she can get a copy of her last mammogram done and colonoscopy for work purpose. Please advise. Pt tel [phone]

## 2022-03-10 ENCOUNTER — Other Ambulatory Visit (HOSPITAL_BASED_OUTPATIENT_CLINIC_OR_DEPARTMENT_OTHER): Payer: Self-pay

## 2022-03-10 ENCOUNTER — Other Ambulatory Visit: Payer: Self-pay | Admitting: Family Medicine

## 2022-03-10 DIAGNOSIS — K219 Gastro-esophageal reflux disease without esophagitis: Secondary | ICD-10-CM

## 2022-03-10 NOTE — Telephone Encounter (Signed)
Patient states that she would like the referral

## 2022-03-10 NOTE — Telephone Encounter (Signed)
Pt received copy of Mammogram, she understands that colonoscopy will need to come from GI.

## 2022-03-11 ENCOUNTER — Other Ambulatory Visit (HOSPITAL_BASED_OUTPATIENT_CLINIC_OR_DEPARTMENT_OTHER): Payer: Self-pay

## 2022-03-14 ENCOUNTER — Telehealth: Payer: Self-pay | Admitting: Family Medicine

## 2022-03-14 LAB — HM DIABETES EYE EXAM

## 2022-03-14 NOTE — Telephone Encounter (Signed)
[facility] is requesting clarification on directions for sucralfate (CARAFATE) 1 g table. Their number is [phone] ref #[id number].

## 2022-03-15 ENCOUNTER — Encounter: Payer: Self-pay | Admitting: Family Medicine

## 2022-03-15 DIAGNOSIS — R87618 Other abnormal cytological findings on specimens from cervix uteri: Secondary | ICD-10-CM

## 2022-03-15 NOTE — Telephone Encounter (Signed)
Instructions were faxed to the pharmacy.

## 2022-03-16 ENCOUNTER — Other Ambulatory Visit: Payer: Self-pay | Admitting: Family Medicine

## 2022-03-16 ENCOUNTER — Ambulatory Visit: Payer: Managed Care, Other (non HMO) | Admitting: Physician Assistant

## 2022-03-16 ENCOUNTER — Encounter: Payer: Self-pay | Admitting: Physician Assistant

## 2022-03-16 VITALS — BP 124/74 | HR 78 | Ht 59.0 in | Wt 177.0 lb

## 2022-03-16 DIAGNOSIS — M1A09X0 Idiopathic chronic gout, multiple sites, without tophus (tophi): Secondary | ICD-10-CM

## 2022-03-16 DIAGNOSIS — K760 Fatty (change of) liver, not elsewhere classified: Secondary | ICD-10-CM

## 2022-03-16 DIAGNOSIS — K219 Gastro-esophageal reflux disease without esophagitis: Secondary | ICD-10-CM | POA: Diagnosis not present

## 2022-03-16 DIAGNOSIS — R159 Full incontinence of feces: Secondary | ICD-10-CM

## 2022-03-16 DIAGNOSIS — D509 Iron deficiency anemia, unspecified: Secondary | ICD-10-CM

## 2022-03-16 DIAGNOSIS — K5904 Chronic idiopathic constipation: Secondary | ICD-10-CM

## 2022-03-16 DIAGNOSIS — I10 Essential (primary) hypertension: Secondary | ICD-10-CM

## 2022-03-16 DIAGNOSIS — E118 Type 2 diabetes mellitus with unspecified complications: Secondary | ICD-10-CM

## 2022-03-16 NOTE — Progress Notes (Signed)
03/16/2022 MEI SUITS [id number] 1970/11/18  Referring provider: Darreld Mclean, MD Primary GI doctor: Dr. Silverio Decamp  ASSESSMENT AND PLAN:   Iron deficiency anemia, unspecified iron deficiency anemia type Colon 2020 with diverticulosis, recall 10 years Continues to have heavy menses that is likely contributing Will schedule for EGD to evaluate completely with symptoms Has OV with hematology, unable to tolerate oral iron due to constipation.  Will schedule EGD to evaluate for possible H. pylori, esophagitis, gastritis, peptic ulcer disease, etc.. I discussed risks of EGD with patient today, including risk of sedation, bleeding or perforation.  Patient provides understanding and gave verbal consent to proceed. Can consider VCE pending results and course with iron/supportive care.   Gastroesophageal reflux disease without esophagitis Not responsive to PPI's with IDA Continue dexilant, will add on carafate up to 4 x a day Lifestyle changes discussed, avoid NSAIDS, ETOH Will schedule for EGD. , Will get RUQ [place] and consider HIDA. , and Weight loss discussed with patient in detail.  Fatty liver Remote history of elevated LFTs Get RUQ [place] with GERD to rule out gallbladder etiology  Incontinence of feces s/p hemorrhoidectomy with history of chronic constipation Get back on miralax/fiber, squatty potty. May have to add on stimulant.  Not helped with pelvic floor PT Continue to avoid lactulose Will refer to [facility] for evaluation for InterStim placement/injection   History of Present Illness:  51 y.o. female  with a past medical history of hypertension, chronic GERD, fecal incontinence status post hemorrhoidectomy and others listed below, returns to clinic today for evaluation of GERD with IDA.  Colonoscopy 04/25/2019: Pan colonic diverticulosis, rectal scar from prior hemorrhoidal surgery, cecal sessile polyp negative for adenomatous tissue, polypoid inflammatory lesion- recall  10 years 09/17/2019 office visit with Dr. Silverio Decamp, continue pelvic floor physical therapy, Benefiber, possible referral for colorectal surgery for evaluation for InterStim placement/injection, lactose-free diet. Patient is on Nexium for reflux.  She presents today with worsening GERD and new iron def.  She has been on prilosec and protonix without help. Worse at night with regurgitation at night. She is also on mylanta, has not started on carafate.  Has been on dexilant for one month which helps some.  No NSAIDS, no ETOH, she has 9 years clear. She does not smoke.  Worse with food, grape drinks/sodas/fruit juices.  She has had symptoms for years, getting worse. Just chest and throat burning worse at night.  Denies nausea, vomiting. No AB pain.  She has lost 6 lbs.  Goes to bed at 930-10, she works and is in school.  She states she is afraid to eat at night, if she does eat will be 730-830.  She denies hematochezia, melena.  She continues to have menstrual periods, can be heavy, can have more than one in a month.  Has hematology referral the 25th, was on iron in the past but stopped due to constipation.   She states she continues to have fecal incontinence and constipation, went 1-2 times for pelvic PT but states that did not help. She was on miralax/fiber which helped some but not enough. She continues to have fecal incontinence and states it affects her quality of life.   CBC  03/07/2022  HGB 10.8 MCV 76.8 with microcytic anemia ferritin low at 8.9 WBC 7.3 Platelets 266.0 Kidney function 03/07/2022  BUN 12 Cr 0.75  Potassium 4.1   LFTs 10/19/2021  AST 17 ALT 18 Alkphos 110 TBili 0.2 03/03/2021 TSH 1.91  She  reports that  she quit smoking about 8 years ago. Her smoking use included cigarettes. She smoked an average of .5 packs per day. She quit smokeless tobacco use about 9 years ago. She reports that she does not currently use drugs after having used the following drugs: Marijuana and  Cocaine. She reports that she does not drink alcohol. Her family history includes Cancer in her father; Diabetes in her father, maternal grandmother, mother, paternal grandfather, and another family member; Hypertension in her father, maternal grandmother, mother, and another family member.   Current Medications:   Current Outpatient Medications (Endocrine & Metabolic):    glipiZIDE (GLUCOTROL XL) 10 MG 24 hr tablet, Take 1 tablet (10 mg total) by mouth daily with breakfast.   metFORMIN (GLUCOPHAGE) 1000 MG tablet, TAKE 1 TABLET BY MOUTH TWICE DAILY  Current Outpatient Medications (Cardiovascular):    amLODipine (NORVASC) 10 MG tablet, TAKE 1 TABLET BY MOUTH ONCE DAILY   lisinopril (ZESTRIL) 10 MG tablet, TAKE 1 TABLET BY MOUTH ONCE DAILY  Current Outpatient Medications (Respiratory):    cetirizine (ZYRTEC) 10 MG tablet, TAKE 1 TABLET BY MOUTH ONCE DAILY AS NEEDED FOR ALLERGIES   fluticasone (FLONASE) 50 MCG/ACT nasal spray, Place 2 sprays into both nostrils daily.   promethazine-dextromethorphan (PROMETHAZINE-DM) 6.25-15 MG/5ML syrup, Take 5 mLs by mouth 4 (four) times daily as needed for cough.  Current Outpatient Medications (Analgesics):    allopurinol (ZYLOPRIM) 100 MG tablet, TAKE 1 TABLET BY MOUTH ONCE DAILY   Current Outpatient Medications (Other):    blood glucose meter kit and supplies, Dispense based on patient and insurance preference. Use up to four times daily as directed. (FOR ICD-10 E10.9, E11.9).   dexlansoprazole (DEXILANT) 60 MG capsule, Take 1 capsule (60 mg total) by mouth daily.   sucralfate (CARAFATE) 1 g tablet, Take 1 tablet (1 g total) by mouth 2 (two) times daily. Use once or twice a day as needed (Patient not taking: Reported on 03/16/2022)  Surgical History:  She  has a past surgical history that includes Hemorrhoid surgery and Anal Rectal manometry (N/A, 04/20/2020).  Current Medications, Allergies, Past Medical History, Past Surgical History, Family  History and Social History were reviewed in [employer] record.  Physical Exam: BP 124/74   Pulse 78   Ht _0  (1.499 m)   Wt 177 lb (80.3 kg)   BMI 35.75 kg/m  General:   Pleasant, well developed female in no acute distress Heart : Regular rate and rhythm; no murmurs Pulm: Clear anteriorly; no wheezing Abdomen:  Soft, Obese AB, Active bowel sounds. mild tenderness in the epigastrium. Without guarding and Without rebound, No organomegaly appreciated. Rectal: declines Extremities:  without  edema. Neurologic:  Alert and  oriented x4;  No focal deficits.  Psych:  Cooperative. Normal mood and affect.   Vladimir Crofts, PA-C 03/16/22

## 2022-03-16 NOTE — Patient Instructions (Addendum)
Please take your proton pump inhibitor medication, dexilant continue this once a day.  Can do the carafate as needed before food.  Carafate can constipation.    Please take this medication 30 minutes to 1 hour before meals- this makes it more effective.  Avoid spicy and acidic foods Avoid fatty foods Limit your intake of coffee, tea, alcohol, and carbonated drinks Work to maintain a healthy weight Keep the head of the bed elevated at least 3 inches with blocks or a wedge pillow if you are having any nighttime symptoms Stay upright for 2 hours after eating Avoid meals and snacks three to four hours before bedtime  Here some information about pelvic floor dysfunction. This may be contributing to some of your symptoms. We will continue with our evaluation but I do want you to consider adding on fiber supplement with low-dose MiraLAX daily. Miralax is an osmotic laxative.  It only brings more water into the stool.  This is safe to take daily.  Can take up to 17 gram of miralax twice a day.  Mix with juice or coffee.  Start 1 capful at night for 3-4 days and reassess your response in 3-4 days.  You can increase and decrease the dose based on your response.  Remember, it can take up to 3-4 days to take effect OR for the effects to wear off.   I often pair this with benefiber in the morning to help assure the stool is not too loose.   Toileting tips to help with your constipation - Drink at least 64-80 ounces of water/liquid per day. - Establish a time to try to move your bowels every day.  For many people, this is after a cup of coffee or after a meal such as breakfast. - Sit all of the way back on the toilet keeping your back fairly straight and while sitting up, try to rest the tops of your forearms on your upper thighs.   - Raising your feet with a step stool/squatty potty can be helpful to improve the angle that allows your stool to pass through the rectum. - Relax the rectum feeling it  bulge toward the toilet water.  If you feel your rectum raising toward your body, you are contracting rather than relaxing. - Breathe in and slowly exhale. "Belly breath" by expanding your belly towards your belly button. Keep belly expanded as you gently direct pressure down and back to the anus.  A low pitched GRRR sound can assist with increasing intra-abdominal pressure.  - Repeat 3-4 times. If unsuccessful, contract the pelvic floor to restore normal tone and get off the toilet.  Avoid excessive straining. - To reduce excessive wiping by teaching your anus to normally contract, place hands on outer aspect of knees and resist knee movement outward.  Hold 5-10 second then place hands just inside of knees and resist inward movement of knees.  Hold 5 seconds.  Repeat a few times each way.  You have been scheduled for an endoscopy. Please follow written instructions given to you at your visit today. If you use inhalers (even only as needed), please bring them with you on the day of your procedure.  You will be contacted by [place] in the next 2 days to arrange a RUQ [place]  The number on your caller ID will be [phone], please answer when they call.  If you have not heard from them in 2 days please call [phone] to schedule.  You have been scheduled for an appointment with ______________ at [facility]. Your appointment is on _____________ at ________________. Please arrive at __________________ for registration. Make certain to bring a list of current medications, including any over the counter medications or vitamins. Also bring your co-pay if you have one as well as your insurance cards. [facility] is located at [address]. Should you need to reschedule your appointment, please contact them at [phone].  We are providing you with a work note for today and also the day of your EGD.   I appreciate the opportunity to  care for you. Vicie Mutters, PA-C

## 2022-03-17 ENCOUNTER — Other Ambulatory Visit (HOSPITAL_BASED_OUTPATIENT_CLINIC_OR_DEPARTMENT_OTHER): Payer: Self-pay

## 2022-03-17 MED ORDER — AMLODIPINE BESYLATE 10 MG PO TABS
10.0000 mg | ORAL_TABLET | Freq: Every day | ORAL | 0 refills | Status: DC
  Filled 2022-03-17: qty 90, 90d supply, fill #0

## 2022-03-17 MED ORDER — ALLOPURINOL 100 MG PO TABS
100.0000 mg | ORAL_TABLET | Freq: Every day | ORAL | 0 refills | Status: DC
  Filled 2022-03-17: qty 90, 90d supply, fill #0

## 2022-03-17 MED ORDER — LISINOPRIL 10 MG PO TABS
10.0000 mg | ORAL_TABLET | Freq: Every day | ORAL | 0 refills | Status: DC
  Filled 2022-03-17: qty 90, 90d supply, fill #0

## 2022-03-26 ENCOUNTER — Ambulatory Visit (HOSPITAL_COMMUNITY)
Admission: RE | Admit: 2022-03-26 | Discharge: 2022-03-26 | Disposition: A | Discharge status: Home / Self Care | Payer: Managed Care, Other (non HMO) | Source: Ambulatory Visit | Attending: Physician Assistant | Admitting: Physician Assistant

## 2022-03-26 DIAGNOSIS — K760 Fatty (change of) liver, not elsewhere classified: Secondary | ICD-10-CM | POA: Diagnosis present

## 2022-03-26 DIAGNOSIS — K219 Gastro-esophageal reflux disease without esophagitis: Secondary | ICD-10-CM | POA: Insufficient documentation

## 2022-03-29 ENCOUNTER — Other Ambulatory Visit: Payer: Self-pay

## 2022-03-29 ENCOUNTER — Other Ambulatory Visit: Payer: Self-pay | Admitting: Family

## 2022-03-29 DIAGNOSIS — K760 Fatty (change of) liver, not elsewhere classified: Secondary | ICD-10-CM

## 2022-03-29 DIAGNOSIS — D649 Anemia, unspecified: Secondary | ICD-10-CM

## 2022-03-29 DIAGNOSIS — R9389 Abnormal findings on diagnostic imaging of other specified body structures: Secondary | ICD-10-CM

## 2022-03-30 ENCOUNTER — Inpatient Hospital Stay: Payer: Managed Care, Other (non HMO) | Admitting: Family

## 2022-03-30 ENCOUNTER — Encounter: Payer: Self-pay | Admitting: Family

## 2022-03-30 ENCOUNTER — Inpatient Hospital Stay: Payer: Managed Care, Other (non HMO) | Attending: Hematology & Oncology

## 2022-03-30 VITALS — BP 142/70 | HR 79 | Temp 98.4°F | Resp 18 | Ht 59.0 in | Wt 179.0 lb

## 2022-03-30 DIAGNOSIS — E119 Type 2 diabetes mellitus without complications: Secondary | ICD-10-CM | POA: Insufficient documentation

## 2022-03-30 DIAGNOSIS — D500 Iron deficiency anemia secondary to blood loss (chronic): Secondary | ICD-10-CM | POA: Diagnosis not present

## 2022-03-30 DIAGNOSIS — N920 Excessive and frequent menstruation with regular cycle: Secondary | ICD-10-CM | POA: Insufficient documentation

## 2022-03-30 DIAGNOSIS — I10 Essential (primary) hypertension: Secondary | ICD-10-CM

## 2022-03-30 DIAGNOSIS — M109 Gout, unspecified: Secondary | ICD-10-CM

## 2022-03-30 DIAGNOSIS — D649 Anemia, unspecified: Secondary | ICD-10-CM

## 2022-03-30 LAB — LACTATE DEHYDROGENASE: LDH: 146 U/L (ref 98–192)

## 2022-03-30 LAB — CMP (CANCER CENTER ONLY)
ALT: 21 U/L (ref 0–44)
AST: 20 U/L (ref 15–41)
Albumin: 4.6 g/dL (ref 3.5–5.0)
Alkaline Phosphatase: 87 U/L (ref 38–126)
Anion gap: 8 (ref 5–15)
BUN: 12 mg/dL (ref 6–20)
CO2: 28 mmol/L (ref 22–32)
Calcium: 9.7 mg/dL (ref 8.9–10.3)
Chloride: 103 mmol/L (ref 98–111)
Creatinine: 0.68 mg/dL (ref 0.44–1.00)
GFR, Estimated: >60 mL/min (ref >60)
Glucose, Bld: 92 mg/dL (ref 70–99)
Potassium: 3.8 mmol/L (ref 3.5–5.1)
Sodium: 139 mmol/L (ref 135–145)
Total Bilirubin: 0.3 mg/dL (ref 0.3–1.2)
Total Protein: 7.5 g/dL (ref 6.5–8.1)

## 2022-03-30 LAB — CBC WITH DIFFERENTIAL (CANCER CENTER ONLY)
Abs Immature Granulocytes: 0.08 10*3/uL — ABNORMAL HIGH (ref 0.00–0.07)
Basophils Absolute: 0 10*3/uL (ref 0.0–0.1)
Basophils Relative: 0 %
Eosinophils Absolute: 0.1 10*3/uL (ref 0.0–0.5)
Eosinophils Relative: 2 %
HCT: 33.8 % — ABNORMAL LOW (ref 36.0–46.0)
Hemoglobin: 10.4 g/dL — ABNORMAL LOW (ref 12.0–15.0)
Immature Granulocytes: 1 %
Lymphocytes Relative: 33 %
Lymphs Abs: 2.7 10*3/uL (ref 0.7–4.0)
MCH: 24.1 pg — ABNORMAL LOW (ref 26.0–34.0)
MCHC: 30.8 g/dL (ref 30.0–36.0)
MCV: 78.2 fL — ABNORMAL LOW (ref 80.0–100.0)
Monocytes Absolute: 0.7 10*3/uL (ref 0.1–1.0)
Monocytes Relative: 8 %
Neutro Abs: 4.5 10*3/uL (ref 1.7–7.7)
Neutrophils Relative %: 56 %
Platelet Count: 279 10*3/uL (ref 150–400)
RBC: 4.32 MIL/uL (ref 3.87–5.11)
RDW: 14.9 % (ref 11.5–15.5)
WBC Count: 8.1 10*3/uL (ref 4.0–10.5)
nRBC: 0 % (ref 0.0–0.2)

## 2022-03-30 LAB — RETICULOCYTES
Immature Retic Fract: 11.3 % (ref 2.3–15.9)
RBC.: 4.35 MIL/uL (ref 3.87–5.11)
Retic Count, Absolute: 46.5 10*3/uL (ref 19.0–186.0)
Retic Ct Pct: 1.1 % (ref 0.4–3.1)

## 2022-03-30 LAB — FERRITIN: Ferritin: 7 ng/mL — ABNORMAL LOW (ref 11–307)

## 2022-03-30 NOTE — Progress Notes (Signed)
Hematology/Oncology Consultation   Name: Deborah [name]      MRN: [id number]    Location: Room/bed info not found  Date: 03/30/2022 Time:4:08 PM   REFERRING PHYSICIAN: Lamar Blinks, MD  REASON FOR CONSULT:  Iron deficiency anemia    DIAGNOSIS: Iron deficiency anemia secondary to heavy cycles  HISTORY OF PRESENT ILLNESS: Deborah [name] is a very pleasant 51 yo African American female with long history of iron deficiency anemia secondary to heavy cycles.  She states that her cycle remains regular with heavy flow. No other blood loss noted. No bruising or petechiae.  She has tried taking oral iron in the past but this causes GI upset and she has not noted any benefit. She is symptomatic with fatigue and chills.  No history of sickle cell disease or trait.  Her maternal aunt also has history of anemia.  She had hemorrhoid surgery 10 years ago and states that since that time she has had rectal leakage. She has been referred to [place] [facility] for consult on 05/16/2022.  Her last colonoscopy was in 2020 and she had 2 benign polyps removed from the cecum as well as diverticuloses in the sigmoid, descending, transverse and ascending colon.  No personal history of cancer. Her father had leukemia.  No history of pregnancy or miscarriage.  No history of thyroid disease.  She does have history of HTN and gout.  She has type II diabetes and is currently on metformin and glipizide. Most recent Hgb A1c was 7.4.  No fever, chills, n/v, cough, rash, dizziness, SOB, chest pain, palpitations, abdominal pain or changes in bowel or bladder habits.  No swelling, tenderness, numbness or tingling in her extremities at this time.  No falls or syncope reported.  No smoking, ETOH or recreational drug use.  Appetite and hydration are good. Weight is stable at 179 lbs.  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   ROS: All other 10 point review of systems is negative.   PAST MEDICAL HISTORY:   Past Medical History:  Diagnosis Date   Allergy    Anemia    Arthritis    Diabetes mellitus    Eczema    GERD (gastroesophageal reflux disease)    Gout    Hyperlipidemia    no meds   Hypertension    Neuromuscular disorder (HCC)    Plantar fasciitis    Polysubstance abuse ([place])    ETOH and Cocaine   Seasonal allergies     ALLERGIES: Allergies  Allergen Reactions   Cephalexin     REACTION: stomach cramps and hives   Hydrocodone     headache   Latex    Neomycin-Bacitracin Zn-Polymyx     REACTION: burning   Sertraline Hives and Itching   Adhesive [Tape] Hives and Rash      MEDICATIONS:  Current Outpatient Medications on File Prior to Visit  Medication Sig Dispense Refill   allopurinol (ZYLOPRIM) 100 MG tablet Take 1 tablet (100 mg total) by mouth daily. 90 tablet 0   amLODipine (NORVASC) 10 MG tablet Take 1 tablet (10 mg total) by mouth daily. 90 tablet 0   blood glucose meter kit and supplies Dispense based on patient and insurance preference. Use up to four times daily as directed. (FOR ICD-10 E10.9, E11.9). 1 each 0   cetirizine (ZYRTEC) 10 MG tablet TAKE 1 TABLET BY MOUTH ONCE DAILY  AS NEEDED FOR ALLERGIES 100 tablet 3   dexlansoprazole (DEXILANT) 60 MG capsule Take 1 capsule (60 mg total) by mouth daily. 90 capsule 3   fluticasone (FLONASE) 50 MCG/ACT nasal spray Place 2 sprays into both nostrils daily. 16 g 12   glipiZIDE (GLUCOTROL XL) 10 MG 24 hr tablet Take 1 tablet (10 mg total) by mouth daily with breakfast. 90 tablet 3   lisinopril (ZESTRIL) 10 MG tablet Take 1 tablet (10 mg total) by mouth daily. 90 tablet 0   metFORMIN (GLUCOPHAGE) 1000 MG tablet TAKE 1 TABLET BY MOUTH TWICE DAILY 180 tablet 3   promethazine-dextromethorphan (PROMETHAZINE-DM) 6.25-15 MG/5ML syrup Take 5 mLs by mouth 4 (four) times daily as needed for cough. 118 mL 0   sucralfate (CARAFATE) 1 g  tablet Take 1 tablet (1 g total) by mouth 2 (two) times daily. Use once or twice a day as needed 180 each 1   No current facility-administered medications on file prior to visit.     PAST SURGICAL HISTORY Past Surgical History:  Procedure Laterality Date   ANAL RECTAL MANOMETRY N/A 04/20/2020   Procedure: ANO RECTAL MANOMETRY;  Surgeon: Ileana Roup, MD;  Location: [facility];  Service: General;  Laterality: N/A;   HEMORRHOID SURGERY      FAMILY HISTORY: Family History  Problem Relation Age of Onset   Hypertension Father    Diabetes Father    Cancer Father    Hypertension Other    Diabetes Other    Diabetes Mother    Hypertension Mother    Diabetes Maternal Grandmother    Hypertension Maternal Grandmother    Diabetes Paternal Grandfather    Colon cancer Neg Hx    Colon polyps Neg Hx    Esophageal cancer Neg Hx    Rectal cancer Neg Hx    Stomach cancer Neg Hx     SOCIAL HISTORY:  reports that she quit smoking about 8 years ago. Her smoking use included cigarettes. She smoked an average of .5 packs per day. She quit smokeless tobacco use about 9 years ago. She reports that she does not currently use drugs after having used the following drugs: Marijuana and Cocaine. She reports that she does not drink alcohol.  PERFORMANCE STATUS: The patient's performance status is 1 - Symptomatic but completely ambulatory  PHYSICAL EXAM: Most Recent Vital Signs: Blood pressure (!) 142/70, pulse 79, temperature 98.4 F (36.9 C), temperature source Oral, resp. rate 18, height 4' 11"  (1.499 m), weight 179 lb (81.2 kg), SpO2 100 %. BP (!) 142/70 (BP Location: Left Arm, Patient Position: Sitting)   Pulse 79   Temp 98.4 F (36.9 C) (Oral)   Resp 18   Ht 4' 11"  (1.499 m)   Wt 179 lb (81.2 kg)   SpO2 100%   BMI 36.15 kg/m   General Appearance:    Alert, cooperative, no distress, appears stated age  Head:    Normocephalic, without obvious abnormality, atraumatic  Eyes:     PERRL, conjunctiva/corneas clear, EOM's intact, fundi    benign, both eyes        Throat:   Lips, mucosa, and tongue normal; teeth and gums normal  Neck:   Supple, symmetrical, trachea midline, no adenopathy;    thyroid:  no enlargement/tenderness/nodules; no carotid   bruit or JVD  Back:     Symmetric, no curvature, ROM normal, no CVA tenderness  Lungs:     Clear to auscultation bilaterally, respirations unlabored  Chest Wall:  No tenderness or deformity   Heart:    Regular rate and rhythm, S1 and S2 normal, no murmur, rub   or gallop     Abdomen:     Soft, non-tender, bowel sounds active all four quadrants,    no masses, no organomegaly        Extremities:   Extremities normal, atraumatic, no cyanosis or edema  Pulses:   2+ and symmetric all extremities  Skin:   Skin color, texture, turgor normal, no rashes or lesions  Lymph nodes:   Cervical, supraclavicular, and axillary nodes normal  Neurologic:   CNII-XII intact, normal strength, sensation and reflexes    throughout    LABORATORY DATA:  Results for orders placed or performed in visit on 03/30/22 (from the past 48 hour(s))  CBC with Differential (Cancer Center Only)     Status: Abnormal   Collection Time: 03/30/22  2:45 PM  Result Value Ref Range   WBC Count 8.1 4.0 - 10.5 K/uL   RBC 4.32 3.87 - 5.11 MIL/uL   Hemoglobin 10.4 (L) 12.0 - 15.0 g/dL    Comment: Reticulocyte Hemoglobin testing may be clinically indicated, consider ordering this additional test [id number]    HCT 33.8 (L) 36.0 - 46.0 %   MCV 78.2 (L) 80.0 - 100.0 fL   MCH 24.1 (L) 26.0 - 34.0 pg   MCHC 30.8 30.0 - 36.0 g/dL   RDW 14.9 11.5 - 15.5 %   Platelet Count 279 150 - 400 K/uL   nRBC 0.0 0.0 - 0.2 %   Neutrophils Relative % 56 %   Neutro Abs 4.5 1.7 - 7.7 K/uL   Lymphocytes Relative 33 %   Lymphs Abs 2.7 0.7 - 4.0 K/uL   Monocytes Relative 8 %   Monocytes Absolute 0.7 0.1 - 1.0 K/uL   Eosinophils Relative 2 %   Eosinophils Absolute 0.1 0.0 -  0.5 K/uL   Basophils Relative 0 %   Basophils Absolute 0.0 0.0 - 0.1 K/uL   Immature Granulocytes 1 %   Abs Immature Granulocytes 0.08 (H) 0.00 - 0.07 K/uL    Comment: Performed at [facility] at [facility], [address] [place], [place] [address]  CMP ([place] only)     Status: None   Collection Time: 03/30/22  2:45 PM  Result Value Ref Range   Sodium 139 135 - 145 mmol/L   Potassium 3.8 3.5 - 5.1 mmol/L   Chloride 103 98 - 111 mmol/L   CO2 28 22 - 32 mmol/L   Glucose, Bld 92 70 - 99 mg/dL    Comment: Glucose reference range applies only to samples taken after fasting for at least 8 hours.   BUN 12 6 - 20 mg/dL   Creatinine 0.68 0.44 - 1.00 mg/dL   Calcium 9.7 8.9 - 10.3 mg/dL   Total Protein 7.5 6.5 - 8.1 g/dL   Albumin 4.6 3.5 - 5.0 g/dL   AST 20 15 - 41 U/L   ALT 21 0 - 44 U/L   Alkaline Phosphatase 87 38 - 126 U/L   Total Bilirubin 0.3 0.3 - 1.2 mg/dL   GFR, Estimated >60 >60 mL/min    Comment: (NOTE) Calculated using the CKD-EPI Creatinine Equation (2021)    Anion gap 8 5 - 15    Comment: Performed at [facility] Lab at [facility], [address], [place], [place] [address]  Reticulocytes     Status: None  Collection Time: 03/30/22  2:46 PM  Result Value Ref Range   Retic Ct Pct 1.1 0.4 - 3.1 %   RBC. 4.35 3.87 - 5.11 MIL/uL   Retic Count, Absolute 46.5 19.0 - 186.0 K/uL   Immature Retic Fract 11.3 2.3 - 15.9 %    Comment: Performed at [facility] Lab at [facility], [address] [place], [place] [address]  Lactate dehydrogenase (LDH)     Status: None   Collection Time: 03/30/22  2:46 PM  Result Value Ref Range   LDH 146 98 - 192 U/L    Comment: Performed at [facility] Lab at [facility], [address] [place], [place] [address]      RADIOGRAPHY: No results found.     PATHOLOGY: None  ASSESSMENT/PLAN: Ms. Malak is a very pleasant 51 yo [place]  American female with long history of iron deficiency anemia secondary to heavy cycles.  We will get her set up for 3 doses of IV iron. Ferritin 7 and iron saturation is 7%.  Follow-up in 8 weeks.   All questions were answered. The patient knows to call the clinic with any problems, questions or concerns. We can certainly see the patient much sooner if necessary.   Lottie Dawson, NP

## 2022-03-31 LAB — IRON AND IRON BINDING CAPACITY (CC-WL,HP ONLY)
Iron: 35 ug/dL (ref 28–170)
Saturation Ratios: 7 % — ABNORMAL LOW (ref 10.4–31.8)
TIBC: 512 ug/dL — ABNORMAL HIGH (ref 250–450)
UIBC: 477 ug/dL — ABNORMAL HIGH (ref 148–442)

## 2022-04-01 ENCOUNTER — Telehealth: Payer: Self-pay

## 2022-04-01 LAB — HGB FRACTIONATION CASCADE
Hgb A2: 2.2 % (ref 1.8–3.2)
Hgb A: 97.8 % (ref 96.4–98.8)
Hgb F: 0 % (ref 0.0–2.0)
Hgb S: 0 %

## 2022-04-01 NOTE — Telephone Encounter (Signed)
Spoke with patient & advised her that GI will contact her for scheduling MRI. We discussed previous US results. Pt verbalized all understanding.

## 2022-04-01 NOTE — Telephone Encounter (Signed)
Left message for patient to call back. Per April McPeak, patient's insurance will only allow scan to take place at [place].

## 2022-04-01 NOTE — Telephone Encounter (Signed)
Inbound call from patient . Advised her about previous note. Please advise.

## 2022-04-04 ENCOUNTER — Encounter: Payer: Self-pay | Admitting: Family

## 2022-04-04 NOTE — Telephone Encounter (Signed)
MRI at [facility] cancelled for Friday & changed locations to GI.

## 2022-04-04 NOTE — Addendum Note (Signed)
Addended by: Bonner Puna H on: 04/04/2022 09:31 AM   Modules accepted: Orders

## 2022-04-08 ENCOUNTER — Ambulatory Visit (HOSPITAL_COMMUNITY): Payer: Managed Care, Other (non HMO)

## 2022-04-13 LAB — ALPHA-THALASSEMIA GENOTYPR

## 2022-04-15 ENCOUNTER — Other Ambulatory Visit: Payer: Self-pay | Admitting: Gastroenterology

## 2022-04-15 ENCOUNTER — Ambulatory Visit: Payer: Managed Care, Other (non HMO) | Admitting: Gastroenterology

## 2022-04-15 ENCOUNTER — Encounter: Payer: Self-pay | Admitting: Gastroenterology

## 2022-04-15 ENCOUNTER — Other Ambulatory Visit (HOSPITAL_BASED_OUTPATIENT_CLINIC_OR_DEPARTMENT_OTHER): Payer: Self-pay

## 2022-04-15 ENCOUNTER — Telehealth: Payer: Self-pay | Admitting: Gastroenterology

## 2022-04-15 DIAGNOSIS — D509 Iron deficiency anemia, unspecified: Secondary | ICD-10-CM | POA: Diagnosis present

## 2022-04-15 DIAGNOSIS — K219 Gastro-esophageal reflux disease without esophagitis: Secondary | ICD-10-CM

## 2022-04-15 MED ORDER — SUCRALFATE 1 G PO TABS
1.0000 g | ORAL_TABLET | Freq: Four times a day (QID) | ORAL | 0 refills | Status: DC
  Filled 2022-04-15: qty 56, 14d supply, fill #0

## 2022-04-15 MED ORDER — SUCRALFATE 1 G PO TABS: 1.0000 g | ORAL_TABLET | Freq: Four times a day (QID) | ORAL | 0 refills | Status: DC

## 2022-04-15 MED ORDER — CARAFATE 1 G PO TABS: 1.0000 g | ORAL_TABLET | Freq: Four times a day (QID) | ORAL | 0 refills | Status: DC

## 2022-04-15 MED ORDER — SODIUM CHLORIDE 0.9 % IV SOLN: 500.0000 mL | Freq: Once | INTRAVENOUS | Status: DC

## 2022-04-15 NOTE — Progress Notes (Unsigned)
0959 Robinul 0.1 mg IV given due large amount of secretions upon assessment.  MD made aware, vss

## 2022-04-15 NOTE — Telephone Encounter (Signed)
Patient calling to check on medication sent into pharmacy today

## 2022-04-15 NOTE — Patient Instructions (Addendum)
Resume previous diet.  Stop PPI.  Start sucralfate tablets 1 gram orally 4 times a day for 2 weeks.  Follow up with Dr. Silverio Decamp next available appt., please call to schedule.     YOU HAD AN ENDOSCOPIC PROCEDURE TODAY AT [facility]:   Refer to the procedure report that was given to you for any specific questions about what was found during the examination.  If the procedure report does not answer your questions, please call your gastroenterologist to clarify.  If you requested that your care partner not be given the details of your procedure findings, then the procedure report has been included in a sealed envelope for you to review at your convenience later.  YOU SHOULD EXPECT: Some feelings of bloating in the abdomen. Passage of more gas than usual.  Walking can help get rid of the air that was put into your GI tract during the procedure and reduce the bloating. If you had a lower endoscopy (such as a colonoscopy or flexible sigmoidoscopy) you may notice spotting of blood in your stool or on the toilet paper. If you underwent a bowel prep for your procedure, you may not have a normal bowel movement for a few days.  Please Note:  You might notice some irritation and congestion in your nose or some drainage.  This is from the oxygen used during your procedure.  There is no need for concern and it should clear up in a day or so.  SYMPTOMS TO REPORT IMMEDIATELY:   Following upper endoscopy (EGD)  Vomiting of blood or coffee ground material  New chest pain or pain under the shoulder blades  Painful or persistently difficult swallowing  New shortness of breath  Fever of 100F or higher  Black, tarry-looking stools  For urgent or emergent issues, a gastroenterologist can be reached at any hour by calling [phone]. Do not use MyChart messaging for urgent concerns.    DIET:  We do recommend a small meal at first, but then you may proceed to your regular diet.  Drink plenty of  fluids but you should avoid alcoholic beverages for 24 hours.  ACTIVITY:  You should plan to take it easy for the rest of today and you should NOT DRIVE or use heavy machinery until tomorrow (because of the sedation medicines used during the test).    FOLLOW UP: Our staff will call the number listed on your records the next business day following your procedure.  We will call around 7:15- 8:00 am to check on you and address any questions or concerns that you may have regarding the information given to you following your procedure. If we do not reach you, we will leave a message.     If any biopsies were taken you will be contacted by phone or by letter within the next 1-3 weeks.  Please call us at [phone] if you have not heard about the biopsies in 3 weeks.    SIGNATURES/CONFIDENTIALITY: You and/or your care partner have signed paperwork which will be entered into your electronic medical record.  These signatures attest to the fact that that the information above on your After Visit Summary has been reviewed and is understood.  Full responsibility of the confidentiality of this discharge information lies with you and/or your care-partner.

## 2022-04-15 NOTE — Progress Notes (Unsigned)
Called to room to assist during endoscopic procedure.  Patient ID and intended procedure confirmed with present staff. Received instructions for my participation in the procedure from the performing physician.  

## 2022-04-15 NOTE — Progress Notes (Unsigned)
Please refer to office visit note 03/16/22 by Vicie Mutters. No additional changes in H&P Patient is appropriate for planned procedure(s) and anesthesia in an ambulatory setting  K. Denzil Magnuson , MD [phone]

## 2022-04-15 NOTE — Op Note (Signed)
Deborah [name] Procedure Date: 04/15/2022 9:55 AM MRN: [id number] Endoscopist: Mauri Pole , MD, [id number] Age: 51 Referring MD:  Date of Birth: August 10, 1970 Gender: Female Account #: [id number] Procedure:                Upper GI endoscopy Indications:              Suspected upper gastrointestinal bleeding in                            patient with unexplained iron deficiency anemia Medicines:                Monitored Anesthesia Care Procedure:                Pre-Anesthesia Assessment:                           - Prior to the procedure, a History and Physical                            was performed, and patient medications and                            allergies were reviewed. The patient's tolerance of                            previous anesthesia was also reviewed. The risks                            and benefits of the procedure and the sedation                            options and risks were discussed with the patient.                            All questions were answered, and informed consent                            was obtained. Prior Anticoagulants: The patient has                            taken no anticoagulant or antiplatelet agents. ASA                            Grade Assessment: II - A patient with mild systemic                            disease. After reviewing the risks and benefits,                            the patient was deemed in satisfactory condition to                            undergo the procedure.  After obtaining informed consent, the endoscope was                            passed under direct vision. Throughout the                            procedure, the patient's blood pressure, pulse, and                            oxygen saturations were monitored continuously. The                            Endoscope was introduced through the mouth, and                             advanced to the second part of duodenum. The upper                            GI endoscopy was accomplished without difficulty.                            The patient tolerated the procedure well. Scope In: Scope Out: Findings:                 The Z-line was regular and was found 36 cm from the                            incisors.                           The gastroesophageal flap valve was visualized                            endoscopically and classified as Hill Grade II                            (fold present, opens with respiration).                           Diffuse atrophic mucosa was found in the entire                            examined stomach. Biopsies were taken with a cold                            forceps for histology. Biopsies were taken with a                            cold forceps for Helicobacter pylori testing.                           Normal mucosa was found in the first portion of the  duodenum and in the second portion of the duodenum.                            Biopsies for histology were taken with a cold                            forceps for evaluation of celiac disease. Complications:            No immediate complications. Estimated Blood Loss:     Estimated blood loss was minimal. Impression:               - Z-line regular, 36 cm from the incisors.                           - Gastroesophageal flap valve classified as Hill                            Grade II (fold present, opens with respiration).                           - Gastric mucosal atrophy. Biopsied.                           - Normal mucosa was found in the first portion of                            the duodenum and in the second portion of the                            duodenum. Biopsied. Recommendation:           - Patient has a contact number available for                            emergencies. The signs and symptoms of potential                             delayed complications were discussed with the                            patient. Return to normal activities tomorrow.                            Written discharge instructions were provided to the                            patient.                           - Resume previous diet.                           - Continue present medications.                           - Await pathology results.                           -  Use sucralfate tablets 1 gram PO QID for 2 weeks.                           - Stop PPI                           - Follow up in office visit with Dr.Thomes Burak next                            available appt, please call to schedule Mauri Pole, MD 04/15/2022 10:31:59 AM This report has been signed electronically.

## 2022-04-15 NOTE — Telephone Encounter (Signed)
Called pt back and let her know the pharmacy showed receipt of order at 1445.  She stated they also called to let her know it was received.  At the time of the call she was scheduled for a follow up appt with Dr. Silverio Decamp on 1/15.

## 2022-04-15 NOTE — Progress Notes (Unsigned)
Report given to PACU, vss 

## 2022-04-15 NOTE — Telephone Encounter (Signed)
Patient called stating she had a procedure this morning and Dr Silverio Decamp prescribed her Carafate states she wanted it sent to [facility] [address] but they have yet to receive it. Please advise

## 2022-04-15 NOTE — Telephone Encounter (Signed)
Would you like for me to notify the patient?

## 2022-04-18 ENCOUNTER — Telehealth: Payer: Self-pay

## 2022-04-18 ENCOUNTER — Inpatient Hospital Stay: Payer: Managed Care, Other (non HMO) | Attending: Hematology & Oncology

## 2022-04-18 ENCOUNTER — Encounter: Payer: Self-pay | Admitting: Gastroenterology

## 2022-04-18 VITALS — BP 117/67 | HR 72 | Temp 98.3°F | Resp 20

## 2022-04-18 DIAGNOSIS — N920 Excessive and frequent menstruation with regular cycle: Secondary | ICD-10-CM | POA: Insufficient documentation

## 2022-04-18 DIAGNOSIS — D500 Iron deficiency anemia secondary to blood loss (chronic): Secondary | ICD-10-CM | POA: Insufficient documentation

## 2022-04-18 MED ORDER — FAMOTIDINE IN NACL 20-0.9 MG/50ML-% IV SOLN
INTRAVENOUS | Status: AC
  Filled 2022-04-18: qty 100

## 2022-04-18 MED ORDER — SODIUM CHLORIDE 0.9 % IV SOLN
40.0000 mg | Freq: Once | INTRAVENOUS | Status: AC
  Administered 2022-04-18: 40 mg via INTRAVENOUS
  Filled 2022-04-18: qty 4

## 2022-04-18 MED ORDER — SODIUM CHLORIDE 0.9 % IV SOLN: Freq: Once | INTRAVENOUS | Status: AC

## 2022-04-18 MED ORDER — SODIUM CHLORIDE 0.9% FLUSH: 3.0000 mL | Freq: Once | INTRAVENOUS | Status: DC | PRN

## 2022-04-18 MED ORDER — SODIUM CHLORIDE 0.9 % IV SOLN: Freq: Once | INTRAVENOUS | Status: DC | PRN

## 2022-04-18 MED ORDER — SODIUM CHLORIDE 0.9% FLUSH: 10.0000 mL | Freq: Once | INTRAVENOUS | Status: DC | PRN

## 2022-04-18 MED ORDER — SODIUM CHLORIDE 0.9 % IV SOLN
300.0000 mg | Freq: Once | INTRAVENOUS | Status: AC
  Administered 2022-04-18: 300 mg via INTRAVENOUS
  Filled 2022-04-18: qty 300

## 2022-04-18 MED ORDER — ACETAMINOPHEN 325 MG PO TABS
650.0000 mg | ORAL_TABLET | Freq: Once | ORAL | Status: AC
  Administered 2022-04-18: 650 mg via ORAL
  Filled 2022-04-18: qty 2

## 2022-04-18 MED ORDER — DIPHENHYDRAMINE HCL 50 MG/ML IJ SOLN
INTRAMUSCULAR | Status: AC
  Filled 2022-04-18: qty 1

## 2022-04-18 MED ORDER — DIPHENHYDRAMINE HCL 50 MG/ML IJ SOLN
50.0000 mg | Freq: Once | INTRAMUSCULAR | Status: AC | PRN
  Administered 2022-04-18: 25 mg via INTRAVENOUS

## 2022-04-18 NOTE — Progress Notes (Signed)
1045-Pt observed slouching to side of recliner.  Pt lethargic and states that she is nauseated and hot.  O2@ 2L [place] placed.  Vale Haven NP to chairside. Pt vomited x 1.  Order received to give pt Benadryl 25 mg IV and Pepcid 40 mg IV per Vale Haven NP.  NS increased to 920m/hr.

## 2022-04-18 NOTE — Telephone Encounter (Signed)
  Follow up Call-     04/15/2022    9:02 AM 04/15/2022    8:57 AM  Call back number  Post procedure Call Back phone  # [phone]   Permission to leave phone message  Yes     Patient questions:  Do you have a fever, pain , or abdominal swelling? Yes.   Pain Score  8 *  Have you tolerated food without any problems? Yes.    Have you been able to return to your normal activities? No.    Do you have any questions about your discharge instructions: Diet   No. Medications  Yes.   Follow up visit  No.  Do you have questions or concerns about your Care? Yes.  Pt. Reports that every time she has taken the Carafate (qid beginning this weekend) she gets a severe headache .  Pt. Reports she has a history of migraines, wasn't sure that she could take Imitrex with the Carafate.  Pt. Going for an infusion this morning, but would like a call this afternoon to confirm how to proceed with taking Carafate.    Actions: * If pain score is 4 or above: Physician/ provider Notified : Harl Bowie, MD.

## 2022-04-18 NOTE — Progress Notes (Signed)
Tylenol 650 mg po given per order of S. Eulas Post NP d/t pt.'s complaints of headache.

## 2022-04-18 NOTE — Progress Notes (Signed)
1120-NS decreased to 500 ml/hr per order of S. Eulas Post NP.  VS stable and pt states that she feels back to normal. Order received for pt to finish liter bag of NS per order of S. Eulas Post NP.

## 2022-04-18 NOTE — Patient Instructions (Signed)

## 2022-04-19 NOTE — Telephone Encounter (Signed)
Called patient , advised her to use Carafate twice daily before breakfast and dinner. Ok to use Imitrex and tylenol as needed for Migraine headaches.

## 2022-04-22 ENCOUNTER — Other Ambulatory Visit: Payer: Self-pay | Admitting: Family

## 2022-04-22 NOTE — Progress Notes (Signed)
Venofer dose reduced to 200 mg and pre meds added for recent n/v post treatment with 300 mg dose.

## 2022-04-25 ENCOUNTER — Other Ambulatory Visit: Payer: Self-pay | Admitting: Family

## 2022-04-25 ENCOUNTER — Inpatient Hospital Stay: Payer: Managed Care, Other (non HMO)

## 2022-04-25 VITALS — BP 132/73 | HR 83 | Temp 98.2°F | Resp 17

## 2022-04-25 DIAGNOSIS — N920 Excessive and frequent menstruation with regular cycle: Secondary | ICD-10-CM | POA: Diagnosis not present

## 2022-04-25 DIAGNOSIS — D500 Iron deficiency anemia secondary to blood loss (chronic): Secondary | ICD-10-CM

## 2022-04-25 MED ORDER — SODIUM CHLORIDE 0.9 % IV SOLN
200.0000 mg | Freq: Once | INTRAVENOUS | Status: AC
  Administered 2022-04-25: 200 mg via INTRAVENOUS
  Filled 2022-04-25: qty 200

## 2022-04-25 MED ORDER — SODIUM CHLORIDE 0.9 % IV SOLN
40.0000 mg | Freq: Once | INTRAVENOUS | Status: AC
  Administered 2022-04-25: 40 mg via INTRAVENOUS
  Filled 2022-04-25: qty 4

## 2022-04-25 MED ORDER — METHYLPREDNISOLONE SODIUM SUCC 125 MG IJ SOLR
60.0000 mg | Freq: Once | INTRAMUSCULAR | Status: AC
  Administered 2022-04-25: 60 mg via INTRAVENOUS

## 2022-04-25 MED ORDER — SODIUM CHLORIDE 0.9 % IV SOLN: Freq: Once | INTRAVENOUS | Status: AC

## 2022-04-25 MED ORDER — DIPHENHYDRAMINE HCL 50 MG/ML IJ SOLN
25.0000 mg | Freq: Once | INTRAMUSCULAR | Status: AC
  Administered 2022-04-25: 25 mg via INTRAVENOUS
  Filled 2022-04-25: qty 1

## 2022-04-25 NOTE — Progress Notes (Signed)
1100-Pt discharged to home with no complaints.

## 2022-04-25 NOTE — Patient Instructions (Signed)

## 2022-04-25 NOTE — Progress Notes (Signed)
0953-Pt complaining of feeling "hot" and "jittery inside of chest."  Venofer stopped and NS started. VS stable and O2'@2L'$   applied.  Vale Haven NP to bedside.  0954-Solulmedrol 60 mg IV given per order of S. Eulas Post NP. [phone] now without complaints and states that she does not feel hot or jittery any more.  Will continue to monitor pt and VS.

## 2022-05-02 ENCOUNTER — Other Ambulatory Visit (HOSPITAL_BASED_OUTPATIENT_CLINIC_OR_DEPARTMENT_OTHER): Payer: Self-pay

## 2022-05-02 ENCOUNTER — Emergency Department (HOSPITAL_COMMUNITY)
Admission: EM | Admit: 2022-05-02 | Discharge: 2022-05-02 | Disposition: A | Discharge status: Home / Self Care | Payer: Managed Care, Other (non HMO) | Attending: Emergency Medicine | Admitting: Emergency Medicine

## 2022-05-02 ENCOUNTER — Inpatient Hospital Stay: Payer: Managed Care, Other (non HMO)

## 2022-05-02 VITALS — BP 135/72 | HR 75 | Temp 98.2°F | Resp 18

## 2022-05-02 DIAGNOSIS — Z91040 Latex allergy status: Secondary | ICD-10-CM | POA: Diagnosis not present

## 2022-05-02 DIAGNOSIS — R112 Nausea with vomiting, unspecified: Secondary | ICD-10-CM | POA: Diagnosis not present

## 2022-05-02 DIAGNOSIS — D500 Iron deficiency anemia secondary to blood loss (chronic): Secondary | ICD-10-CM

## 2022-05-02 DIAGNOSIS — R1013 Epigastric pain: Secondary | ICD-10-CM | POA: Insufficient documentation

## 2022-05-02 DIAGNOSIS — R55 Syncope and collapse: Secondary | ICD-10-CM | POA: Diagnosis not present

## 2022-05-02 DIAGNOSIS — D72829 Elevated white blood cell count, unspecified: Secondary | ICD-10-CM | POA: Insufficient documentation

## 2022-05-02 LAB — COMPREHENSIVE METABOLIC PANEL
ALT: 24 U/L (ref 0–44)
AST: 23 U/L (ref 15–41)
Albumin: 4.1 g/dL (ref 3.5–5.0)
Alkaline Phosphatase: 77 U/L (ref 38–126)
Anion gap: 12 (ref 5–15)
BUN: 13 mg/dL (ref 6–20)
CO2: 22 mmol/L (ref 22–32)
Calcium: 9 mg/dL (ref 8.9–10.3)
Chloride: 107 mmol/L (ref 98–111)
Creatinine, Ser: 0.9 mg/dL (ref 0.44–1.00)
GFR, Estimated: >60 mL/min (ref >60)
Glucose, Bld: 213 mg/dL — ABNORMAL HIGH (ref 70–99)
Potassium: 3.1 mmol/L — ABNORMAL LOW (ref 3.5–5.1)
Sodium: 141 mmol/L (ref 135–145)
Total Bilirubin: 0.8 mg/dL (ref 0.3–1.2)
Total Protein: 7.2 g/dL (ref 6.5–8.1)

## 2022-05-02 LAB — CBC WITH DIFFERENTIAL/PLATELET
Abs Immature Granulocytes: 0.09 10*3/uL — ABNORMAL HIGH (ref 0.00–0.07)
Basophils Absolute: 0.1 10*3/uL (ref 0.0–0.1)
Basophils Relative: 0 %
Eosinophils Absolute: 0.1 10*3/uL (ref 0.0–0.5)
Eosinophils Relative: 0 %
HCT: 44.6 % (ref 36.0–46.0)
Hemoglobin: 13.5 g/dL (ref 12.0–15.0)
Immature Granulocytes: 0 %
Lymphocytes Relative: 15 %
Lymphs Abs: 3.1 10*3/uL (ref 0.7–4.0)
MCH: 24.5 pg — ABNORMAL LOW (ref 26.0–34.0)
MCHC: 30.3 g/dL (ref 30.0–36.0)
MCV: 80.8 fL (ref 80.0–100.0)
Monocytes Absolute: 1.2 10*3/uL — ABNORMAL HIGH (ref 0.1–1.0)
Monocytes Relative: 6 %
Neutro Abs: 16.3 10*3/uL — ABNORMAL HIGH (ref 1.7–7.7)
Neutrophils Relative %: 79 %
Platelets: 302 10*3/uL (ref 150–400)
RBC: 5.52 MIL/uL — ABNORMAL HIGH (ref 3.87–5.11)
RDW: 17.1 % — ABNORMAL HIGH (ref 11.5–15.5)
WBC: 20.8 10*3/uL — ABNORMAL HIGH (ref 4.0–10.5)
nRBC: 0 % (ref 0.0–0.2)

## 2022-05-02 LAB — LIPASE, BLOOD: Lipase: 25 U/L (ref 11–51)

## 2022-05-02 MED ORDER — SODIUM CHLORIDE 0.9 % IV BOLUS
1000.0000 mL | Freq: Once | INTRAVENOUS | Status: AC
  Administered 2022-05-02: 1000 mL via INTRAVENOUS

## 2022-05-02 MED ORDER — SODIUM CHLORIDE 0.9 % IV SOLN: Freq: Once | INTRAVENOUS | Status: AC

## 2022-05-02 MED ORDER — SODIUM CHLORIDE 0.9 % IV SOLN
510.0000 mg | Freq: Once | INTRAVENOUS | Status: AC
  Administered 2022-05-02: 510 mg via INTRAVENOUS
  Filled 2022-05-02: qty 17

## 2022-05-02 MED ORDER — SODIUM CHLORIDE 0.9 % IV SOLN
40.0000 mg | Freq: Once | INTRAVENOUS | Status: AC
  Administered 2022-05-02: 40 mg via INTRAVENOUS
  Filled 2022-05-02: qty 4

## 2022-05-02 MED ORDER — DIPHENHYDRAMINE HCL 50 MG/ML IJ SOLN
25.0000 mg | Freq: Once | INTRAMUSCULAR | Status: AC
  Administered 2022-05-02: 25 mg via INTRAVENOUS
  Filled 2022-05-02: qty 1

## 2022-05-02 MED ORDER — ALUM & MAG HYDROXIDE-SIMETH 200-200-20 MG/5ML PO SUSP
30.0000 mL | Freq: Once | ORAL | Status: AC
  Administered 2022-05-02: 30 mL via ORAL
  Filled 2022-05-02: qty 30

## 2022-05-02 MED ORDER — ONDANSETRON HCL 4 MG/2ML IJ SOLN
4.0000 mg | Freq: Once | INTRAMUSCULAR | Status: AC
  Administered 2022-05-02: 4 mg via INTRAVENOUS
  Filled 2022-05-02: qty 2

## 2022-05-02 NOTE — Discharge Instructions (Signed)
Eat and drink as well as you can the next 48 hours.  Please discuss with your family doctor in the event that you had here.  They may not want you to have recurrent iron infusions after this event.  Please return for worsening pain fever inability eat or drink.

## 2022-05-02 NOTE — ED Notes (Signed)
Pt ambulatory to bathroom w/o assist 

## 2022-05-02 NOTE — ED Triage Notes (Signed)
Patient BIB [facility] from home. Had an iron infusion today. Feeling weak, vomiting, has abdominal pain in her upper abdomen. BP dropped to 80/56.   EMS BP 102/68

## 2022-05-02 NOTE — Patient Instructions (Signed)

## 2022-05-02 NOTE — ED Provider Notes (Signed)
[facility] DEPT Provider Note   CSN: [id number] Arrival date & time: 05/02/22  1355     History  Chief Complaint  Patient presents with   Abdominal Pain    Deborah [name] is a 51 y.o. female.  51 yo F with a chief complaints of not feeling well after having an iron infusion.  She had an iron infusion earlier in the week and had developed some symptoms they are concerned for acute allergy but told her to come back and they would try a different type of infusion today.  She felt well during the infusion but when she was on her way home developed some abdominal discomfort felt nauseated and then thinks that maybe she passed out.  EMS was called and she was found to be hypotensive initially and then improved in route.  She had had a couple episodes of nausea and vomiting and 1 episode where she lost control of her bowels.  She still feels like her abdomen is a bit uncomfortable especially in the epigastrium.   Abdominal Pain      Home Medications Prior to Admission medications   Medication Sig Start Date End Date Taking? Authorizing Provider  allopurinol (ZYLOPRIM) 100 MG tablet Take 1 tablet (100 mg total) by mouth daily. 03/17/22   Copland, Gay Filler, MD  amLODipine (NORVASC) 10 MG tablet Take 1 tablet (10 mg total) by mouth daily. 03/17/22   Copland, Gay Filler, MD  blood glucose meter kit and supplies Dispense based on patient and insurance preference. Use up to four times daily as directed. (FOR ICD-10 E10.9, E11.9). 04/16/19   Copland, Gay Filler, MD  cetirizine (ZYRTEC) 10 MG tablet TAKE 1 TABLET BY MOUTH ONCE DAILY AS NEEDED FOR ALLERGIES 11/22/21 11/22/22  Copland, Gay Filler, MD  fluticasone (FLONASE) 50 MCG/ACT nasal spray Place 2 sprays into both nostrils daily. 11/04/21   Copland, Gay Filler, MD  glipiZIDE (GLUCOTROL XL) 10 MG 24 hr tablet Take 1 tablet (10 mg total) by mouth daily with breakfast. 11/04/21 11/04/22  Copland, Gay Filler, MD  lisinopril  (ZESTRIL) 10 MG tablet Take 1 tablet (10 mg total) by mouth daily. 03/17/22   Copland, Gay Filler, MD  metFORMIN (GLUCOPHAGE) 1000 MG tablet TAKE 1 TABLET BY MOUTH TWICE DAILY 11/04/21 11/04/22  Copland, Gay Filler, MD  promethazine-dextromethorphan (PROMETHAZINE-DM) 6.25-15 MG/5ML syrup Take 5 mLs by mouth 4 (four) times daily as needed for cough. 01/24/22   Copland, Gay Filler, MD  sucralfate (CARAFATE) 1 g tablet Take 1 tablet (1 g total) by mouth 4 (four) times daily. Patient taking differently: Take 1 g by mouth 2 (two) times daily. 04/15/22   Mauri Pole, MD      Allergies    Venofer [iron sucrose], Cephalexin, Hydrocodone, Latex, Neomycin-bacitracin zn-polymyx, Sertraline, and Adhesive [tape]    Review of Systems   Review of Systems  Gastrointestinal:  Positive for abdominal pain.    Physical Exam Updated Vital Signs BP (!) 137/113   Pulse 80   Temp 98.5 F (36.9 C) (Oral)   Resp 17   Ht _0  (1.499 m)   Wt 80.7 kg   SpO2 99%   BMI 35.95 kg/m  Physical Exam Vitals and nursing note reviewed.  Constitutional:      General: She is not in acute distress.    Appearance: She is well-developed. She is not diaphoretic.  HENT:     Head: Normocephalic and atraumatic.  Eyes:     Pupils: Pupils  are equal, round, and reactive to light.  Cardiovascular:     Rate and Rhythm: Normal rate and regular rhythm.     Heart sounds: No murmur heard.    No friction rub. No gallop.  Pulmonary:     Effort: Pulmonary effort is normal.     Breath sounds: No wheezing or rales.  Abdominal:     General: There is no distension.     Palpations: Abdomen is soft.     Tenderness: There is no abdominal tenderness.     Comments: Benign abdominal exam  Musculoskeletal:        General: No tenderness.     Cervical back: Normal range of motion and neck supple.  Skin:    General: Skin is warm and dry.  Neurological:     Mental Status: She is alert and oriented to person, place, and time.   Psychiatric:        Behavior: Behavior normal.     ED Results / Procedures / Treatments   Labs (all labs ordered are listed, but only abnormal results are displayed) Labs Reviewed  CBC WITH DIFFERENTIAL/PLATELET - Abnormal; Notable for the following components:      Result Value   WBC 20.8 (*)    RBC 5.52 (*)    MCH 24.5 (*)    RDW 17.1 (*)    Neutro Abs 16.3 (*)    Monocytes Absolute 1.2 (*)    Abs Immature Granulocytes 0.09 (*)    All other components within normal limits  COMPREHENSIVE METABOLIC PANEL - Abnormal; Notable for the following components:   Potassium 3.1 (*)    Glucose, Bld 213 (*)    All other components within normal limits  LIPASE, BLOOD    EKG EKG Interpretation  Date/Time:  Monday May 02 2022 14:54:33 EST Ventricular Rate:  80 PR Interval:  132 QRS Duration: 73 QT Interval:  348 QTC Calculation: 402 R Axis:   46 Text Interpretation: Sinus rhythm LAE, consider biatrial enlargement no wpw, prolonged qt or brugada Otherwise no significant change Confirmed by Deno Etienne [phone]) on 05/02/2022 3:06:09 PM  Radiology No results found.  Procedures Procedures    Medications Ordered in ED Medications  sodium chloride 0.9 % bolus 1,000 mL (0 mLs Intravenous Stopped 05/02/22 1607)  ondansetron (ZOFRAN) injection 4 mg (4 mg Intravenous Given 05/02/22 1507)  alum & mag hydroxide-simeth (MAALOX/MYLANTA) 200-200-20 MG/5ML suspension 30 mL (30 mLs Oral Given 05/02/22 1607)    ED Course/ Medical Decision Making/ A&P                           Medical Decision Making Amount and/or Complexity of Data Reviewed Labs: ordered. ECG/medicine tests: ordered.  Risk OTC drugs. Prescription drug management.   51 yo F with a chief complaints of not feeling well after an iron infusion today.  Looking at the patient's medical record it seems that she had an intolerance to an iron infusion previously, at that time there is some concern for anaphylaxis.  Not  definitively anaphylaxis today.  She did have some episodes of hypotension with EMS.  By history sounds more vasovagal.  She is complaining of some epigastric discomfort though has a very benign exam, seems unlikely to be ACS based on history and physical.  Will obtain an EKG blood work bolus of IV fluids reassess.  Patient has a significant leukocytosis of unknown etiology.  There is a neutrophilic predominance as well.  She continues  to look well.  Tolerating by mouth.  Will discharge home.  PCP follow-up.  4:10 PM:  I have discussed the diagnosis/risks/treatment options with the patient.  Evaluation and diagnostic testing in the emergency department does not suggest an emergent condition requiring admission or immediate intervention beyond what has been performed at this time.  They will follow up with PCP. We also discussed returning to the ED immediately if new or worsening sx occur. We discussed the sx which are most concerning (e.g., sudden worsening pain, fever, inability to tolerate by mouth) that necessitate immediate return. Medications administered to the patient during their visit and any new prescriptions provided to the patient are listed below.  Medications given during this visit Medications  sodium chloride 0.9 % bolus 1,000 mL (0 mLs Intravenous Stopped 05/02/22 1607)  ondansetron (ZOFRAN) injection 4 mg (4 mg Intravenous Given 05/02/22 1507)  alum & mag hydroxide-simeth (MAALOX/MYLANTA) 200-200-20 MG/5ML suspension 30 mL (30 mLs Oral Given 05/02/22 1607)     The patient appears reasonably screen and/or stabilized for discharge and I doubt any other medical condition or other [facility] requiring further screening, evaluation, or treatment in the ED at this time prior to discharge.          Final Clinical Impression(s) / ED Diagnoses Final diagnoses:  Syncope and collapse    Rx / DC Orders ED Discharge Orders     None         Deno Etienne, DO 05/02/22 1610

## 2022-05-03 ENCOUNTER — Telehealth: Payer: Self-pay | Admitting: Family Medicine

## 2022-05-03 NOTE — Telephone Encounter (Signed)
Patient scheduled for 05/05/22.

## 2022-05-03 NOTE — Telephone Encounter (Signed)
Pt stated she was advised to tell pcp she was seen at the ER by the ER dr. Durward Fortes pt to make an appt to come and see [place], scheduled her for Thursday. ER summary is available in epic.

## 2022-05-04 ENCOUNTER — Other Ambulatory Visit (HOSPITAL_BASED_OUTPATIENT_CLINIC_OR_DEPARTMENT_OTHER): Payer: Self-pay

## 2022-05-04 NOTE — Progress Notes (Addendum)
[facility] [address] [place], [place] [address] [phone]  Date:  05/05/2022   Name:  Deborah [name]   DOB:  [birth year]   MRN:  [id number]  PCP:  Darreld Mclean, MD    Chief Complaint: ER follow up 05/02/22- Syncope and Collapse (Rash on arms itching and burning )   History of Present Illness:  Deborah [name] is a 51 y.o. very pleasant female patient who presents with the following:  Patient seen today for a follow-up visit Most recent visit with myself was in October History of diabetes, dyslipidemia hypertension, anemia related to menstrual blood losses She was also seen in the ER on 11/27-she had just received an iron infusion and then apparently passed out at home.  EMS came and took her to the hospital.  It looks like she may have had an allergic reaction to the iron infusion with vasovagal syncope  Pt notes she had milder sx of allergic reaction with her first 2 infusions but the 3rd infusion caused the above sx.  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   MUSCULOSKELETAL PAIN 10/01/2009   DYSLIPIDEMIA 12/05/2008   Fatty liver 10/20/2008   NUMMULAR ECZEMA 05/22/2008   FEMALE INFERTILITY 05/01/2007   ANEMIA, IRON DEFICIENCY 04/03/2007   SMOKER 11/28/2006   HYPERTENSION, MILD 11/28/2006   HEMORRHOIDS, INTERNAL W/O COMPLICATION 93/73/4287    RHINITIS, ALLERGIC, DUE TO POLLEN 11/28/2006   GERD 11/28/2006   DERMATITIS, ATOPIC 11/28/2006   COCAINE ABUSE, HX OF 11/28/2006   LIVER FUNCTION TESTS, ABNORMAL 09/14/2006   GOUT 06/25/2004   MORTON'S NEUROMA, 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    Past Medical History:  Diagnosis Date   Allergy    Anemia    Arthritis    Diabetes mellitus    Eczema    GERD (gastroesophageal reflux disease)    Gout    Hyperlipidemia    no meds   Hypertension    Neuromuscular disorder ([place])    Plantar fasciitis    Polysubstance abuse ([place])    ETOH and Cocaine   Seasonal allergies     Past Surgical History:  Procedure Laterality Date   ANAL RECTAL MANOMETRY N/A 04/20/2020   Procedure: ANO RECTAL MANOMETRY;  Surgeon: Ileana Roup, MD;  Location: [facility];  Service: General;  Laterality: N/A;   HEMORRHOID SURGERY      Social History   Tobacco Use   Smoking status: Former    Packs/day: 0.50    Types: Cigarettes    Quit date: 04/24/2013    Years since quitting: 9.0   Smokeless tobacco: Former    Quit date: 03/14/2013  Vaping Use   Vaping Use: Never used  Substance Use Topics   Alcohol use: No    Comment: none since 03-14-13   Drug use: Not Currently    Types: Marijuana,  Cocaine    Comment: Quit 04/14/2013 per pt.     Family History  Problem Relation Age of Onset   Hypertension Father    Diabetes Father    Cancer Father    Hypertension Other    Diabetes Other    Diabetes Mother    Hypertension Mother    Diabetes Maternal Grandmother    Hypertension Maternal Grandmother    Diabetes Paternal Grandfather    Colon cancer Neg Hx    Colon polyps Neg Hx    Esophageal cancer Neg Hx    Rectal cancer Neg Hx    Stomach cancer Neg Hx     Allergies  Allergen Reactions   Iron [Ferrous Sulfate]     Iron Infusion    Venofer [Iron Sucrose] Other (See Comments)    Per report, "Pt lethargic and slumped over to side of chair. BP low. Pt states that she was nauseated and hot."    Cephalexin      REACTION: stomach cramps and hives   Hydrocodone     headache   Latex    Neomycin-Bacitracin Zn-Polymyx     REACTION: burning   Sertraline Hives and Itching   Adhesive [Tape] Hives and Rash    Medication list has been reviewed and updated.  Current Outpatient Medications on File Prior to Visit  Medication Sig Dispense Refill   allopurinol (ZYLOPRIM) 100 MG tablet Take 1 tablet (100 mg total) by mouth daily. 90 tablet 0   amLODipine (NORVASC) 10 MG tablet Take 1 tablet (10 mg total) by mouth daily. 90 tablet 0   blood glucose meter kit and supplies Dispense based on patient and insurance preference. Use up to four times daily as directed. (FOR ICD-10 E10.9, E11.9). 1 each 0   cetirizine (ZYRTEC) 10 MG tablet TAKE 1 TABLET BY MOUTH ONCE DAILY AS NEEDED FOR ALLERGIES 100 tablet 3   fluticasone (FLONASE) 50 MCG/ACT nasal spray Place 2 sprays into both nostrils daily. 16 g 12   glipiZIDE (GLUCOTROL XL) 10 MG 24 hr tablet Take 1 tablet (10 mg total) by mouth daily with breakfast. 90 tablet 3   lisinopril (ZESTRIL) 10 MG tablet Take 1 tablet (10 mg total) by mouth daily. 90 tablet 0   metFORMIN (GLUCOPHAGE) 1000 MG tablet TAKE 1 TABLET BY MOUTH TWICE DAILY 180 tablet 3   promethazine-dextromethorphan (PROMETHAZINE-DM) 6.25-15 MG/5ML syrup Take 5 mLs by mouth 4 (four) times daily as needed for cough. 118 mL 0   sucralfate (CARAFATE) 1 g tablet Take 1 tablet (1 g total) by mouth 4 (four) times daily. (Patient taking differently: Take 1 g by mouth 2 (two) times daily.) 56 tablet 0   No current facility-administered medications on file prior to visit.    Review of Systems:  As per HPI- otherwise negative.   Physical Examination: Vitals:   05/05/22 1453 05/05/22 1519  BP: (!) 148/92 130/80  Pulse: 90   Resp: 18   Temp: 98.1 F (36.7 C)   SpO2: 100%    Vitals:   05/05/22 1453  Weight: 177 lb 12.8 oz (80.6 kg)  Height: 4' 11" (1.499 m)   Body mass index is 35.91 kg/m. Ideal  Body Weight: Weight in (lb) to have BMI = 25: 123.5  GEN: no acute distress. Obese, looks well  HEENT: Atraumatic, Normocephalic.  Ears and Nose: No external deformity. CV: RRR, No M/G/R. No JVD. No thrill. No extra heart sounds. PULM: CTA B, no wheezes, crackles, rhonchi. No retractions. No resp. distress.  No accessory muscle use. ABD: S, NT, ND, +BS. No rebound. No HSM. EXTR: No c/c/e PSYCH: Normally interactive. Conversant.    Assessment and Plan: Leukocytosis, unspecified type - Plan: CBC  Hypokalemia - Plan: Basic metabolic panel  Iron deficiency - Plan: Ferritin Following up today from recent apparent allergic reaction and vasovagal reaction from iron infusion Follow-up on iron, leukocytosis and hypokalemia Will plan further follow- up pending labs. She does not plan to do further iron infusion   Signed Lamar Blinks, MD  Addendum 12/1, received labs as below.  Message to patient Results for orders placed or performed in visit on 05/05/22  CBC  Result Value Ref Range   WBC 8.6 4.0 - 10.5 K/uL   RBC 4.43 3.87 - 5.11 Mil/uL   Platelets 248.0 150.0 - 400.0 K/uL   Hemoglobin 11.2 (L) 12.0 - 15.0 g/dL   HCT 34.5 (L) 36.0 - 46.0 %   MCV 77.9 (L) 78.0 - 100.0 fl   MCHC 32.3 30.0 - 36.0 g/dL   RDW 17.6 (H) 11.5 - 11.9 %  Basic metabolic panel  Result Value Ref Range   Sodium 139 135 - 145 mEq/L   Potassium 3.7 3.5 - 5.1 mEq/L   Chloride 100 96 - 112 mEq/L   CO2 26 19 - 32 mEq/L   Glucose, Bld 176 (H) 70 - 99 mg/dL   BUN 11 6 - 23 mg/dL   Creatinine, Ser 0.81 0.40 - 1.20 mg/dL   GFR 82.62 >60.00 mL/min   Calcium 9.8 8.4 - 10.5 mg/dL  Ferritin  Result Value Ref Range   Ferritin 340.8 (H) 10.0 - 291.0 ng/mL

## 2022-05-05 ENCOUNTER — Ambulatory Visit: Payer: Managed Care, Other (non HMO) | Admitting: Family Medicine

## 2022-05-05 ENCOUNTER — Other Ambulatory Visit (HOSPITAL_BASED_OUTPATIENT_CLINIC_OR_DEPARTMENT_OTHER): Payer: Self-pay

## 2022-05-05 VITALS — BP 130/80 | HR 90 | Temp 98.1°F | Resp 18 | Ht 59.0 in | Wt 177.8 lb

## 2022-05-05 DIAGNOSIS — E876 Hypokalemia: Secondary | ICD-10-CM | POA: Diagnosis not present

## 2022-05-05 DIAGNOSIS — D72829 Elevated white blood cell count, unspecified: Secondary | ICD-10-CM

## 2022-05-05 DIAGNOSIS — E611 Iron deficiency: Secondary | ICD-10-CM | POA: Diagnosis not present

## 2022-05-05 NOTE — Patient Instructions (Addendum)
It was good to see again today, hope you are continuing to feel better  Would suggest getting the latest COVID-19 booster at your pharmacy if not done already  I will be in touch with your labs asap!  We will figure out a plan as far as your iron deficiency

## 2022-05-06 ENCOUNTER — Encounter: Payer: Self-pay | Admitting: Family Medicine

## 2022-05-06 LAB — BASIC METABOLIC PANEL
BUN: 11 mg/dL (ref 6–23)
CO2: 26 mEq/L (ref 19–32)
Calcium: 9.8 mg/dL (ref 8.4–10.5)
Chloride: 100 mEq/L (ref 96–112)
Creatinine, Ser: 0.81 mg/dL (ref 0.40–1.20)
GFR: 82.62 mL/min (ref >60.00)
Glucose, Bld: 176 mg/dL — ABNORMAL HIGH (ref 70–99)
Potassium: 3.7 mEq/L (ref 3.5–5.1)
Sodium: 139 mEq/L (ref 135–145)

## 2022-05-06 LAB — CBC
HCT: 34.5 % — ABNORMAL LOW (ref 36.0–46.0)
Hemoglobin: 11.2 g/dL — ABNORMAL LOW (ref 12.0–15.0)
MCHC: 32.3 g/dL (ref 30.0–36.0)
MCV: 77.9 fl — ABNORMAL LOW (ref 78.0–100.0)
Platelets: 248 10*3/uL (ref 150.0–400.0)
RBC: 4.43 Mil/uL (ref 3.87–5.11)
RDW: 17.6 % — ABNORMAL HIGH (ref 11.5–15.5)
WBC: 8.6 10*3/uL (ref 4.0–10.5)

## 2022-05-06 LAB — FERRITIN: Ferritin: 340.8 ng/mL — ABNORMAL HIGH (ref 10.0–291.0)

## 2022-05-10 ENCOUNTER — Encounter: Payer: Self-pay | Admitting: Gastroenterology

## 2022-05-10 ENCOUNTER — Encounter: Payer: Self-pay | Admitting: Family Medicine

## 2022-05-20 ENCOUNTER — Encounter: Payer: Self-pay | Admitting: Obstetrics and Gynecology

## 2022-05-20 ENCOUNTER — Ambulatory Visit: Payer: Managed Care, Other (non HMO) | Admitting: Obstetrics and Gynecology

## 2022-05-20 VITALS — BP 143/85 | HR 78 | Ht 59.0 in | Wt 173.0 lb

## 2022-05-20 DIAGNOSIS — R87618 Other abnormal cytological findings on specimens from cervix uteri: Secondary | ICD-10-CM

## 2022-05-20 DIAGNOSIS — N920 Excessive and frequent menstruation with regular cycle: Secondary | ICD-10-CM | POA: Diagnosis not present

## 2022-05-20 NOTE — Progress Notes (Signed)
NEW GYNECOLOGY VISIT  Subjective:  Deborah [name] is a 51 y.o. G0 with LMP 03/06/2022 (approx) presenting for evaluation of endometrial cells on pap smear.   Reviewed note & pap done by Dr. Lorelei Pont on 03/07/22 - NILM/HPV negative but with endometrial cells present. Deborah [name] thinks she had her period around the time of her pap.  She is not menopausal. Still gets regular monthly cycles lasting 7 days with heaviest bleeding on first 2-3 days. Changes a tampon every 2-3 hours for hygiene on the heaviest days. Skipped 1 cycle this month and maybe 1 cycle in the year prior. Has crampy pain on the first few days of her cycle. Her cycles have not changed in terms of heaviness/pain/duration.  Denies hot flashes, dryness.  Has never had pelvic imaging. Recent Hgb 11.2 (05/05/22), most recent TSH 03/03/21 1.91.  Of note, she is being treated for suspected iron deficiency anemia and had poor reactions to IV iron infusions.   PapHx: 03/07/2022  Cytology - PAP  HIGH RISK HPV ([place]): Negative ADEQUACY: Satisfactory for evaluation; transformation zone component PRESENT. DIAGNOSIS: - Negative for intraepithelial lesion or malignancy (NILM)  Endometrial cells are present.  Clinical correlation and correlation with the LMP is recommended.   04/01/2019  Cytology - PAP  HIGH RISK HPV ([place]): Negative ADEQUACY: Satisfactory for evaluation; transformation zone component ABSENT. DIAGNOSIS: - Negative for intraepithelial lesion or malignancy (NILM)   10/05/2015  Cytology - PAP  *Pap Smear: NILM *HPV: HRHPV -   07/29/2010  Cytology - PAP  *Pap Smear: NILM    PMH: T2DM, HTN, GERD, gout, iron deficiency anemia PSH: hemorrhoid Meds: amlodipine, lisinopril, metformin, glipizide, allopurinol OB: G0  Objective:   Vitals:   05/20/22 1013  BP: (!) 143/85  Pulse: 78  Weight: 173 lb (78.5 kg)  Height: '4\' 11"'$  (1.499 m)   General:  Alert, oriented and cooperative. Patient is in no acute  distress.  Skin: Skin is warm and dry. No rash noted.   Cardiovascular: Normal heart rate noted  Respiratory: Normal respiratory effort, no problems with respiration noted   Assessment and Plan:  Deborah [name] is a 51 y.o. with endometrial cells on pap smear and heavy menstrual periods  Benign-appearing endometrial cells on cervical Pap smear Reviewed that the endometrial cells on her pap are attributable to her menstrual cycle  Reviewed median age of menopause is 21-16 years old, so she is still within normal range to be getting periods Discussed pros/cons of EMB. Reviewed that [facility] does not recommend EMB in asymptomatic premenopausal patients with benign endometrial cells on pap. Her current menstrual cycles are within the range of normal and have not changed, so it would be reasonable to defer. Reviewed low threshold for EMB should there be any changes in her bleeding   2. Menorrhagia with regular cycle, iron deficiency anemia Deborah [name] voiced concerns that her menstrual cycles could be contributing to her anemia, but otherwise feels like they are at baseline and are not particularly bothering her. We discussed that her cycles are still overall within normal range, but that common causes of heavy cycles include structural issues like fibroids and polyps that can be evaluated with pelvic US.  We discussed common medications/procedures that women may choose to lighten/stop their periods. We reviewed that some of these options have the benefit of endometrial protection in s/o her risk factors for endometrial hyperplasia (age, BMI, DM).  She is not interested in any systemic medications, but is open  to Mirena IUD.  After discussion, Ms. Schmoll would like to proceed with pelvic US to evaluate for structural issues. She is considering a Mirena IUD - US PELVIC COMPLETE WITH TRANSVAGINAL  Return in about 3 months (around 08/19/2022) for ultrasound, return gyn for possible IUD insertion.  Future  Appointments  Date Time Provider [place]  05/31/2022  1:00 PM [facility] None  05/31/2022  1:30 PM Celso Amy, NP [facility] None  06/20/2022 11:00 AM Nand[place], Venia Minks, MD [facility]   Inez Catalina, MD

## 2022-05-20 NOTE — Progress Notes (Signed)
New pt in office to discuss endometrial cells found on pap.   Pt has referral to hematology for anemia due to heavy cycles.

## 2022-05-26 ENCOUNTER — Ambulatory Visit (HOSPITAL_BASED_OUTPATIENT_CLINIC_OR_DEPARTMENT_OTHER): Payer: Managed Care, Other (non HMO)

## 2022-05-31 ENCOUNTER — Ambulatory Visit: Payer: Managed Care, Other (non HMO) | Admitting: Family

## 2022-05-31 ENCOUNTER — Other Ambulatory Visit: Payer: Managed Care, Other (non HMO)

## 2022-06-09 ENCOUNTER — Telehealth: Payer: Managed Care, Other (non HMO) | Admitting: Internal Medicine

## 2022-06-09 ENCOUNTER — Encounter: Payer: Self-pay | Admitting: Family

## 2022-06-09 ENCOUNTER — Encounter: Payer: Self-pay | Admitting: Internal Medicine

## 2022-06-09 ENCOUNTER — Other Ambulatory Visit (HOSPITAL_BASED_OUTPATIENT_CLINIC_OR_DEPARTMENT_OTHER): Payer: Self-pay

## 2022-06-09 ENCOUNTER — Other Ambulatory Visit: Payer: Self-pay | Admitting: Family Medicine

## 2022-06-09 DIAGNOSIS — U071 COVID-19: Secondary | ICD-10-CM | POA: Insufficient documentation

## 2022-06-09 DIAGNOSIS — E1165 Type 2 diabetes mellitus with hyperglycemia: Secondary | ICD-10-CM

## 2022-06-09 DIAGNOSIS — E118 Type 2 diabetes mellitus with unspecified complications: Secondary | ICD-10-CM

## 2022-06-09 DIAGNOSIS — I10 Essential (primary) hypertension: Secondary | ICD-10-CM

## 2022-06-09 DIAGNOSIS — M1A09X0 Idiopathic chronic gout, multiple sites, without tophus (tophi): Secondary | ICD-10-CM

## 2022-06-09 MED ORDER — HYDROCODONE BIT-HOMATROP MBR 5-1.5 MG/5ML PO SOLN
5.0000 mL | Freq: Four times a day (QID) | ORAL | 0 refills | Status: AC | PRN
  Filled 2022-06-09: qty 180, 9d supply, fill #0

## 2022-06-09 MED ORDER — ALBUTEROL SULFATE HFA 108 (90 BASE) MCG/ACT IN AERS
2.0000 | INHALATION_SPRAY | Freq: Four times a day (QID) | RESPIRATORY_TRACT | 0 refills | Status: DC | PRN
  Filled 2022-06-09: qty 6.7, 20d supply, fill #0

## 2022-06-09 MED ORDER — NIRMATRELVIR/RITONAVIR (PAXLOVID)TABLET
3.0000 | ORAL_TABLET | Freq: Two times a day (BID) | ORAL | 0 refills | Status: AC
  Filled 2022-06-09: qty 30, 5d supply, fill #0

## 2022-06-09 NOTE — Progress Notes (Signed)
Patient ID: Deborah [name], female   DOB: 30-Nov-1970, 52 y.o.   MRN: [id number]  Virtual Visit via Video Note  I connected with Ronie Spies on 06/09/22 at  3:40 PM EST by a video enabled telemedicine application and verified that I am speaking with the correct person using two identifiers.  Location of all participants today Patient: in her car on cell phone Provider: at office   I discussed the limitations of evaluation and management by telemedicine and the availability of in person appointments. The patient expressed understanding and agreed to proceed.  History of Present Illness: Here to f/u with c/o new onset home + COVID infection with symptoms start x 2 days.  Has had Vax and no prior infections.  Now with sinus nasal congestion, but smell and taste intact.  Pt denies chest pain, increased sob or doe, wheezing, orthopnea, PND, increased LE swelling, palpitations, dizziness or syncope.  No N/V/D.   Pt denies polydipsia, polyuria, or new focal neuro s/s.    Past Medical History:  Diagnosis Date   Allergy    Anemia    Arthritis    Diabetes mellitus    Eczema    GERD (gastroesophageal reflux disease)    Gout    Hyperlipidemia    no meds   Hypertension    Neuromuscular disorder ([place])    Plantar fasciitis    Polysubstance abuse ([place])    ETOH and Cocaine   Seasonal allergies    Past Surgical History:  Procedure Laterality Date   ANAL RECTAL MANOMETRY N/A 04/20/2020   Procedure: ANO RECTAL MANOMETRY;  Surgeon: Ileana Roup, MD;  Location: [facility];  Service: General;  Laterality: N/A;   HEMORRHOID SURGERY      reports that she quit smoking about 9 years ago. Her smoking use included cigarettes. She smoked an average of .5 packs per day. She quit smokeless tobacco use about 9 years ago. She reports that she does not currently use drugs after having used the following drugs: Marijuana and Cocaine. She reports that she does not drink alcohol. family history includes  Cancer in her father; Diabetes in her father, maternal grandmother, mother, paternal grandfather, and another family member; Hypertension in her father, maternal grandmother, mother, and another family member. Allergies  Allergen Reactions   Iron [Ferrous Sulfate]     Iron Infusion    Venofer [Iron Sucrose] Other (See Comments)    Per report, "Pt lethargic and slumped over to side of chair. BP low. Pt states that she was nauseated and hot."    Cephalexin     REACTION: stomach cramps and hives   Hydrocodone     headache   Latex    Neomycin-Bacitracin Zn-Polymyx     REACTION: burning   Sertraline Hives and Itching   Adhesive [Tape] Hives and Rash   Current Outpatient Medications on File Prior to Visit  Medication Sig Dispense Refill   allopurinol (ZYLOPRIM) 100 MG tablet Take 1 tablet (100 mg total) by mouth daily. 90 tablet 0   amLODipine (NORVASC) 10 MG tablet Take 1 tablet (10 mg total) by mouth daily. 90 tablet 0   blood glucose meter kit and supplies Dispense based on patient and insurance preference. Use up to four times daily as directed. (FOR ICD-10 E10.9, E11.9). 1 each 0   cetirizine (ZYRTEC) 10 MG tablet TAKE 1 TABLET BY MOUTH ONCE DAILY AS NEEDED FOR ALLERGIES 100 tablet 3   fluticasone (FLONASE) 50 MCG/ACT nasal spray Place 2 sprays  into both nostrils daily. 16 g 12   glipiZIDE (GLUCOTROL XL) 10 MG 24 hr tablet Take 1 tablet (10 mg total) by mouth daily with breakfast. 90 tablet 3   lisinopril (ZESTRIL) 10 MG tablet Take 1 tablet (10 mg total) by mouth daily. 90 tablet 0   metFORMIN (GLUCOPHAGE) 1000 MG tablet TAKE 1 TABLET BY MOUTH TWICE DAILY 180 tablet 3   promethazine-dextromethorphan (PROMETHAZINE-DM) 6.25-15 MG/5ML syrup Take 5 mLs by mouth 4 (four) times daily as needed for cough. 118 mL 0   sucralfate (CARAFATE) 1 g tablet Take 1 tablet (1 g total) by mouth 4 (four) times daily. (Patient taking differently: Take 1 g by mouth 2 (two) times daily.) 56 tablet 0   No  current facility-administered medications on file prior to visit.    Observations/Objective: Alert, NAD, appropriate mood and affect, resps normal, cn 2-12 intact, moves all 4s, no visible rash or swelling Lab Results  Component Value Date   WBC 8.6 05/05/2022   HGB 11.2 (L) 05/05/2022   HCT 34.5 (L) 05/05/2022   PLT 248.0 05/05/2022   GLUCOSE 176 (H) 05/05/2022   CHOL 165 03/03/2021   TRIG 69.0 03/03/2021   HDL 75.40 03/03/2021   LDLCALC 76 03/03/2021   ALT 24 05/02/2022   AST 23 05/02/2022   NA 139 05/05/2022   K 3.7 05/05/2022   CL 100 05/05/2022   CREATININE 0.81 05/05/2022   BUN 11 05/05/2022   CO2 26 05/05/2022   TSH 1.91 03/03/2021   HGBA1C 7.3 (H) 03/07/2022   MICROALBUR 1.4 03/07/2022   Assessment and Plan: See notes  Follow Up Instructions: See notes   I discussed the assessment and treatment plan with the patient. The patient was provided an opportunity to ask questions and all were answered. The patient agreed with the plan and demonstrated an understanding of the instructions.   The patient was advised to call back or seek an in-person evaluation if the symptoms worsen or if the condition fails to improve as anticipated.   Cathlean Cower, MD

## 2022-06-09 NOTE — Patient Instructions (Signed)
Please take all new medication as prescribed  You had the work note sent to you as well

## 2022-06-09 NOTE — Assessment & Plan Note (Addendum)
Lab Results  Component Value Date   HGBA1C 7.3 (H) 03/07/2022   Mild uncontrolled, pt to continue current medical treatment glucotrol xl 10 qd, metformin 1000 bid and better diet, wt loss, excercise

## 2022-06-09 NOTE — Assessment & Plan Note (Signed)
Mild to mod, for antibx course paxlovid asd, cough med prn, albuterol HFA prn,  to f/u any worsening symptoms or concerns

## 2022-06-10 ENCOUNTER — Other Ambulatory Visit (HOSPITAL_BASED_OUTPATIENT_CLINIC_OR_DEPARTMENT_OTHER): Payer: Self-pay

## 2022-06-10 MED ORDER — AMLODIPINE BESYLATE 10 MG PO TABS
10.0000 mg | ORAL_TABLET | Freq: Every day | ORAL | 0 refills | Status: DC
  Filled 2022-06-10 – 2022-06-23 (×3): qty 90, 90d supply, fill #0

## 2022-06-10 MED ORDER — ALLOPURINOL 100 MG PO TABS
100.0000 mg | ORAL_TABLET | Freq: Every day | ORAL | 0 refills | Status: DC
  Filled 2022-06-10 – 2022-06-23 (×3): qty 90, 90d supply, fill #0

## 2022-06-10 MED ORDER — LISINOPRIL 10 MG PO TABS
10.0000 mg | ORAL_TABLET | Freq: Every day | ORAL | 0 refills | Status: DC
  Filled 2022-06-10 – 2022-06-23 (×3): qty 90, 90d supply, fill #0

## 2022-06-14 ENCOUNTER — Telehealth: Payer: Self-pay | Admitting: Family Medicine

## 2022-06-14 NOTE — Telephone Encounter (Signed)
Okay for work note?

## 2022-06-14 NOTE — Telephone Encounter (Signed)
Patient called to advise that she is on her 5th day of having covid and tested negative. Patient said she still has runny nose/headache. Patient would like to know if she can get a work note to write her out of work until Friday or Monday as she does not want to get reinfected. Patient is okay to pick up. Please call to advise if she can get the work note.

## 2022-06-17 NOTE — Telephone Encounter (Signed)
Spoke with patient and she has already went back to work.  I advised her about being protected.  She has stated that COVID is going around at work.  She has been wearing a mask.

## 2022-06-20 ENCOUNTER — Ambulatory Visit: Payer: Managed Care, Other (non HMO) | Admitting: Gastroenterology

## 2022-06-23 ENCOUNTER — Other Ambulatory Visit (HOSPITAL_BASED_OUTPATIENT_CLINIC_OR_DEPARTMENT_OTHER): Payer: Self-pay

## 2022-06-23 ENCOUNTER — Other Ambulatory Visit: Payer: Self-pay

## 2022-06-23 ENCOUNTER — Telehealth: Payer: Self-pay | Admitting: Family Medicine

## 2022-06-23 MED ORDER — SUMATRIPTAN SUCCINATE 50 MG PO TABS
50.0000 mg | ORAL_TABLET | Freq: Once | ORAL | 0 refills | Status: DC
  Filled 2022-06-23: qty 9, 30d supply, fill #0

## 2022-06-23 NOTE — Telephone Encounter (Signed)
Patient came in requesting a refill on a discontinued medication She states she is still having headaches & needs a refill  Please advise    If so,  Pharmacy down stairs will work for her   RX: SUMAtriptan (IMITREX) 50 MG tablet [[id number]]  DISCONTINUED

## 2022-07-20 ENCOUNTER — Encounter (HOSPITAL_BASED_OUTPATIENT_CLINIC_OR_DEPARTMENT_OTHER): Payer: Self-pay | Admitting: Emergency Medicine

## 2022-07-20 ENCOUNTER — Encounter: Payer: Self-pay | Admitting: Family

## 2022-07-20 ENCOUNTER — Other Ambulatory Visit (HOSPITAL_BASED_OUTPATIENT_CLINIC_OR_DEPARTMENT_OTHER): Payer: Self-pay

## 2022-07-20 ENCOUNTER — Emergency Department (HOSPITAL_BASED_OUTPATIENT_CLINIC_OR_DEPARTMENT_OTHER)
Admission: EM | Admit: 2022-07-20 | Discharge: 2022-07-20 | Disposition: A | Discharge status: Home / Self Care | Payer: Managed Care, Other (non HMO) | Attending: Emergency Medicine | Admitting: Emergency Medicine

## 2022-07-20 ENCOUNTER — Other Ambulatory Visit: Payer: Self-pay

## 2022-07-20 DIAGNOSIS — Z7984 Long term (current) use of oral hypoglycemic drugs: Secondary | ICD-10-CM | POA: Insufficient documentation

## 2022-07-20 DIAGNOSIS — M7989 Other specified soft tissue disorders: Secondary | ICD-10-CM | POA: Diagnosis present

## 2022-07-20 DIAGNOSIS — L03012 Cellulitis of left finger: Secondary | ICD-10-CM | POA: Insufficient documentation

## 2022-07-20 DIAGNOSIS — Z91040 Latex allergy status: Secondary | ICD-10-CM | POA: Diagnosis not present

## 2022-07-20 DIAGNOSIS — E119 Type 2 diabetes mellitus without complications: Secondary | ICD-10-CM | POA: Diagnosis not present

## 2022-07-20 DIAGNOSIS — Z79899 Other long term (current) drug therapy: Secondary | ICD-10-CM | POA: Insufficient documentation

## 2022-07-20 MED ORDER — LIDOCAINE HCL (PF) 1 % IJ SOLN
10.0000 mL | Freq: Once | INTRAMUSCULAR | Status: AC
  Administered 2022-07-20: 10 mL via INTRADERMAL
  Filled 2022-07-20: qty 10

## 2022-07-20 MED ORDER — TRAMADOL HCL 50 MG PO TABS
50.0000 mg | ORAL_TABLET | Freq: Four times a day (QID) | ORAL | 0 refills | Status: DC | PRN
  Filled 2022-07-20: qty 15, 4d supply, fill #0

## 2022-07-20 MED ORDER — DOXYCYCLINE HYCLATE 100 MG PO CAPS
100.0000 mg | ORAL_CAPSULE | Freq: Two times a day (BID) | ORAL | 0 refills | Status: DC
  Filled 2022-07-20: qty 14, 7d supply, fill #0

## 2022-07-20 NOTE — Discharge Instructions (Signed)
Get help right away if: You have severe pain or bleeding. You cannot eat or drink without vomiting. You have a fever or chills. You have redness that spreads quickly. You have decreased urine output. You become short of breath. You have chest pain. You cough up blood. The affected area becomes numb or starts to tingle. These symptoms may represent a serious problem that is an emergency. Do not wait to see if the symptoms will go away. Get medical help right away. Call your local emergency services (911 in the [place] Do not drive yourself to the hospital.

## 2022-07-20 NOTE — ED Provider Notes (Signed)
[facility] Provider Note   CSN: [id number] Arrival date & time: 07/20/22  [id number]     History  Chief Complaint  Patient presents with   Finger Injury    Deborah [name] is a 52 y.o. female presents emergency department with chief complaint of pain and swelling and throbbing around her left middle nail.  She is diabetic and has a history of a paronychia on the right finger after pulling a hangnail.  She did the same thing a few days ago and has had progressively worsening pain and swelling.  She has no other complaints at this time.  HPI     Home Medications Prior to Admission medications   Medication Sig Start Date End Date Taking? Authorizing Provider  albuterol (VENTOLIN HFA) 108 (90 Base) MCG/ACT inhaler Inhale 2 puffs into the lungs every 6 (six) hours as needed for wheezing or shortness of breath. 06/09/22   Biagio Borg, MD  allopurinol (ZYLOPRIM) 100 MG tablet Take 1 tablet (100 mg total) by mouth daily. 06/10/22   Copland, Gay Filler, MD  amLODipine (NORVASC) 10 MG tablet Take 1 tablet (10 mg total) by mouth daily. 06/10/22   Copland, Gay Filler, MD  blood glucose meter kit and supplies Dispense based on patient and insurance preference. Use up to four times daily as directed. (FOR ICD-10 E10.9, E11.9). 04/16/19   Copland, Gay Filler, MD  cetirizine (ZYRTEC) 10 MG tablet TAKE 1 TABLET BY MOUTH ONCE DAILY AS NEEDED FOR ALLERGIES 11/22/21 11/22/22  Copland, Gay Filler, MD  fluticasone (FLONASE) 50 MCG/ACT nasal spray Place 2 sprays into both nostrils daily. 11/04/21   Copland, Gay Filler, MD  glipiZIDE (GLUCOTROL XL) 10 MG 24 hr tablet Take 1 tablet (10 mg total) by mouth daily with breakfast. 11/04/21 11/04/22  Copland, Gay Filler, MD  lisinopril (ZESTRIL) 10 MG tablet Take 1 tablet (10 mg total) by mouth daily. 06/10/22   Copland, Gay Filler, MD  metFORMIN (GLUCOPHAGE) 1000 MG tablet TAKE 1 TABLET BY MOUTH TWICE DAILY 11/04/21 11/04/22  Copland, Gay Filler,  MD  promethazine-dextromethorphan (PROMETHAZINE-DM) 6.25-15 MG/5ML syrup Take 5 mLs by mouth 4 (four) times daily as needed for cough. 01/24/22   Copland, Gay Filler, MD  sucralfate (CARAFATE) 1 g tablet Take 1 tablet (1 g total) by mouth 4 (four) times daily. Patient taking differently: Take 1 g by mouth 2 (two) times daily. 04/15/22   Mauri Pole, MD  SUMAtriptan (IMITREX) 50 MG tablet Take 1 tablet (50 mg total) by mouth once for 1 dose. May repeat in 2 hours if headache persists or recurs. 06/23/22 06/25/22  Copland, Gay Filler, MD      Allergies    Ferrous sulfate, Venofer [iron sucrose], Cephalexin, Latex, Neomycin-bacitracin zn-polymyx, Sertraline, Adhesive [tape], and Hydrocodone    Review of Systems   Review of Systems  Physical Exam Updated Vital Signs BP (!) 154/89 (BP Location: Left Arm)   Pulse 84   Temp 98.1 F (36.7 C) (Oral)   Resp 20   Ht 4' 11"$  (1.499 m)   Wt 79.8 kg   LMP 04/18/2022   SpO2 100%   BMI 35.55 kg/m  Physical Exam Physical Exam  Nursing note and vitals reviewed. Constitutional: She is oriented to person, place, and time. She appears well-developed and well-nourished. No distress.  HENT:  Head: Normocephalic and atraumatic.  Eyes: Conjunctivae normal and EOM are normal. Pupils are equal, round, and reactive to light. No scleral icterus.  Neck: Normal range of motion.  Cardiovascular: Normal rate, regular rhythm and normal heart sounds.  Exam reveals no gallop and no friction rub.   No murmur heard. Pulmonary/Chest: Effort normal and breath sounds normal. No respiratory distress.  Abdominal: Soft. Bowel sounds are normal. She exhibits no distension and no mass. There is no tenderness. There is no guarding.  Neurological: She is alert and oriented to person, place, and time.  Skin: Skin is warm and dry. She is not diaphoretic.  Right middle nail with fluctuance consistent with paronychia on the radial side of the left middle finger. ED Results /  Procedures / Treatments   Labs (all labs ordered are listed, but only abnormal results are displayed) Labs Reviewed - No data to display  EKG None  Radiology No results found.  Procedures .Marland KitchenIncision and Drainage  Date/Time: 07/20/2022 6:42 PM  Performed by: Margarita Mail, PA-C Authorized by: Margarita Mail, PA-C   Consent:    Consent obtained:  Verbal   Consent given by:  Patient   Risks discussed:  Bleeding, incomplete drainage, pain and damage to other organs   Alternatives discussed:  No treatment Universal protocol:    Patient identity confirmed:  Verbally with patient Location:    Indications for incision and drainage: paronychia.   Location:  Upper extremity   Upper extremity location:  Finger   Finger location:  L long finger Pre-procedure details:    Skin preparation:  Povidone-iodine Anesthesia:    Anesthesia method:  Nerve block   Block location:  Left long finger   Block needle gauge:  25 G   Block technique:  Digital   Block injection procedure:  Anatomic landmarks palpated and negative aspiration for blood   Block outcome:  Anesthesia achieved Procedure type:    Complexity:  Simple Procedure details:    Incision types:  Stab incision   Wound management:  Probed and deloculated   Drainage:  Purulent   Drainage amount:  Scant   Wound treatment:  Wound left open Post-procedure details:    Procedure completion:  Tolerated     Medications Ordered in ED Medications - No data to display  ED Course/ Medical Decision Making/ A&P                             Medical Decision Making 52 year old diabetic female with paronychia.  Successful I&D done.  No evidence of felon or extensive infection.  Will discharge on doxycycline.  Discussed return precautions.  Patient appears otherwise appropriate for discharge at this time  Risk Prescription drug management.           Final Clinical Impression(s) / ED Diagnoses Final diagnoses:  None     Rx / DC Orders ED Discharge Orders     None         Margarita Mail, PA-C 07/20/22 1844    Isla Pence, MD 07/20/22 1954

## 2022-07-20 NOTE — ED Triage Notes (Signed)
Pt had a hangnail to her left middle finger and she removed it about 3 days ago.  Noted swelling around nail bed.  Pt states it is getting worse

## 2022-07-21 ENCOUNTER — Telehealth: Payer: Self-pay

## 2022-07-21 NOTE — Transitions of Care (Post Inpatient/ED Visit) (Signed)
   07/21/2022  Name: KANIAH RIZZOLO MRN: [id number] DOB: [birth year]  Today's TOC FU Call Status: Today's TOC FU Call Status:: Unsuccessul Call (1st Attempt) Unsuccessful Call (1st Attempt) Date: 07/21/22  Attempted to reach the patient regarding the most recent Inpatient/ED visit.  Follow Up Plan: Additional outreach attempts will be made to reach the patient to complete the Transitions of Care (Post Inpatient/ED visit) call.   [place]

## 2022-07-22 NOTE — Transitions of Care (Post Inpatient/ED Visit) (Signed)
   07/22/2022  Name: Deborah [name] MRN: [id number] DOB: 25-Mar-1971  Today's TOC FU Call Status: Today's TOC FU Call Status:: Unsuccessul Call (1st Attempt) Unsuccessful Call (1st Attempt) Date: 07/21/22  Attempted to reach the patient regarding the most recent Inpatient/ED visit.  Follow Up Plan: No further outreach attempts will be made at this time. We have been unable to contact the patient.  Patient scheduled for F/U with PCP on 07/28/2022.   [place]

## 2022-07-23 NOTE — Progress Notes (Addendum)
[facility] [address], [place], [place] [address] [id number]  Date:  07/28/2022   Name:  Deborah [name]   DOB:  Feb 21, 1971   MRN:  [id number]  PCP:  Darreld Mclean, MD    Chief Complaint: No chief complaint on file.   History of Present Illness:  Deborah [name] is a 52 y.o. 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   MUSCULOSKELETAL PAIN 10/01/2009   DYSLIPIDEMIA 12/05/2008   Fatty liver 10/20/2008   NUMMULAR ECZEMA 05/22/2008   FEMALE INFERTILITY 05/01/2007   ANEMIA, IRON DEFICIENCY 04/03/2007   SMOKER 11/28/2006   HYPERTENSION, MILD 11/28/2006   HEMORRHOIDS, INTERNAL W/O COMPLICATION [id number]   RHINITIS, ALLERGIC, DUE TO POLLEN 11/28/2006   GERD 11/28/2006   DERMATITIS, ATOPIC 11/28/2006   COCAINE ABUSE, HX OF 11/28/2006   LIVER FUNCTION TESTS, ABNORMAL 09/14/2006   GOUT 06/25/2004   MORTON'S NEUROMA, 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    Past Medical History:  Diagnosis Date   Allergy    Anemia    Arthritis    Diabetes mellitus    Eczema    GERD (gastroesophageal reflux disease)    Gout    Hyperlipidemia    no meds   Hypertension    Neuromuscular disorder ([place])    Plantar fasciitis    Polysubstance abuse ([place])    ETOH and Cocaine   Seasonal allergies     Past Surgical History:  Procedure Laterality Date   ANAL RECTAL MANOMETRY N/A 04/20/2020   Procedure: ANO RECTAL MANOMETRY;  Surgeon: Ileana Roup, MD;  Location: [facility];  Service: General;  Laterality: N/A;   HEMORRHOID SURGERY      Social History   Tobacco Use   Smoking status: Former    Packs/day: 0.50    Types: Cigarettes    Quit date: 04/24/2013    Years since quitting: 9.2   Smokeless tobacco: Former    Quit date: 03/14/2013  Vaping Use   Vaping Use: Never used  Substance Use Topics   Alcohol use: No    Comment: none since 03-14-13   Drug use: Not Currently    Types: Marijuana, Cocaine    Comment: Quit 04/14/2013 per pt.     Family History  Problem Relation Age of Onset   Hypertension Father    Diabetes Father    Cancer Father    Hypertension Other    Diabetes Other    Diabetes Mother    Hypertension Mother    Diabetes Maternal Grandmother    Hypertension Maternal Grandmother    Diabetes Paternal Grandfather    Colon cancer Neg Hx    Colon polyps Neg Hx    Esophageal cancer Neg Hx    Rectal cancer Neg Hx    Stomach cancer Neg Hx     Allergies  Allergen Reactions   Ferrous Sulfate Hives    Iron Infusion   Venofer [Iron Sucrose] Other (See Comments)  Per report, "Pt lethargic and slumped over to side of chair. BP low. Pt states that she was nauseated and hot."    Cephalexin Hives    REACTION: stomach cramps and hives   Latex Itching   Neomycin-Bacitracin Zn-Polymyx     REACTION: burning   Sertraline Hives and Itching   Adhesive [Tape] Hives and Rash   Hydrocodone Rash    headache    Medication list has been reviewed and updated.  Current Outpatient Medications on File Prior to Visit  Medication Sig Dispense Refill   albuterol (VENTOLIN HFA) 108 (90 Base) MCG/ACT inhaler Inhale 2 puffs into the lungs every 6 (six) hours as needed for wheezing or shortness of breath. 6.7 g 0   allopurinol (ZYLOPRIM) 100 MG tablet Take 1 tablet (100 mg total) by mouth daily. 90 tablet 0   amLODipine (NORVASC) 10 MG tablet Take 1 tablet (10 mg total) by mouth daily. 90 tablet 0   blood glucose meter kit and supplies  Dispense based on patient and insurance preference. Use up to four times daily as directed. (FOR ICD-10 E10.9, E11.9). 1 each 0   cetirizine (ZYRTEC) 10 MG tablet TAKE 1 TABLET BY MOUTH ONCE DAILY AS NEEDED FOR ALLERGIES 100 tablet 3   doxycycline (VIBRAMYCIN) 100 MG capsule Take 1 capsule (100 mg total) by mouth 2 (two) times daily. 14 capsule 0   fluticasone (FLONASE) 50 MCG/ACT nasal spray Place 2 sprays into both nostrils daily. 16 g 12   glipiZIDE (GLUCOTROL XL) 10 MG 24 hr tablet Take 1 tablet (10 mg total) by mouth daily with breakfast. 90 tablet 3   lisinopril (ZESTRIL) 10 MG tablet Take 1 tablet (10 mg total) by mouth daily. 90 tablet 0   metFORMIN (GLUCOPHAGE) 1000 MG tablet TAKE 1 TABLET BY MOUTH TWICE DAILY 180 tablet 3   promethazine-dextromethorphan (PROMETHAZINE-DM) 6.25-15 MG/5ML syrup Take 5 mLs by mouth 4 (four) times daily as needed for cough. 118 mL 0   sucralfate (CARAFATE) 1 g tablet Take 1 tablet (1 g total) by mouth 4 (four) times daily. (Patient taking differently: Take 1 g by mouth 2 (two) times daily.) 56 tablet 0   SUMAtriptan (IMITREX) 50 MG tablet Take 1 tablet (50 mg total) by mouth once for 1 dose. May repeat in 2 hours if headache persists or recurs. 10 tablet 0   traMADol (ULTRAM) 50 MG tablet Take 1 tablet (50 mg total) by mouth every 6 (six) hours as needed. 15 tablet 0   No current facility-administered medications on file prior to visit.    Review of Systems:  As per HPI- otherwise negative.   Physical Examination: There were no vitals filed for this visit. There were no vitals filed for this visit. There is no height or weight on file to calculate BMI. Ideal Body Weight:    GEN: no acute distress. HEENT: Atraumatic, Normocephalic.  Ears and Nose: No external deformity. CV: RRR, No M/G/R. No JVD. No thrill. No extra heart sounds. PULM: CTA B, no wheezes, crackles, rhonchi. No retractions. No resp. distress. No accessory muscle use. ABD: S, NT, ND,  +BS. No rebound. No HSM. EXTR: No c/c/e PSYCH: Normally interactive. Conversant.    Assessment and Plan: ***  Signed Lamar Blinks, MD

## 2022-07-23 NOTE — Patient Instructions (Incomplete)
Good to see you again today- your finger looks better but let's do another 5 days of doxycycline just to be sure I will be in touch with your labs asap

## 2022-07-28 ENCOUNTER — Encounter: Payer: Self-pay | Admitting: Family Medicine

## 2022-07-28 ENCOUNTER — Ambulatory Visit: Payer: Managed Care, Other (non HMO) | Admitting: Family Medicine

## 2022-07-28 ENCOUNTER — Other Ambulatory Visit (HOSPITAL_BASED_OUTPATIENT_CLINIC_OR_DEPARTMENT_OTHER): Payer: Self-pay

## 2022-07-28 VITALS — BP 124/82 | HR 80 | Resp 17 | Ht 59.0 in | Wt 173.8 lb

## 2022-07-28 DIAGNOSIS — L03012 Cellulitis of left finger: Secondary | ICD-10-CM

## 2022-07-28 DIAGNOSIS — N912 Amenorrhea, unspecified: Secondary | ICD-10-CM

## 2022-07-28 DIAGNOSIS — E119 Type 2 diabetes mellitus without complications: Secondary | ICD-10-CM | POA: Diagnosis not present

## 2022-07-28 DIAGNOSIS — E1169 Type 2 diabetes mellitus with other specified complication: Secondary | ICD-10-CM

## 2022-07-28 DIAGNOSIS — I10 Essential (primary) hypertension: Secondary | ICD-10-CM

## 2022-07-28 DIAGNOSIS — E785 Hyperlipidemia, unspecified: Secondary | ICD-10-CM | POA: Diagnosis not present

## 2022-07-28 LAB — COMPREHENSIVE METABOLIC PANEL
ALT: 17 U/L (ref 0–35)
AST: 18 U/L (ref 0–37)
Albumin: 4.6 g/dL (ref 3.5–5.2)
Alkaline Phosphatase: 95 U/L (ref 39–117)
BUN: 12 mg/dL (ref 6–23)
CO2: 28 mEq/L (ref 19–32)
Calcium: 10.3 mg/dL (ref 8.4–10.5)
Chloride: 102 mEq/L (ref 96–112)
Creatinine, Ser: 0.65 mg/dL (ref 0.40–1.20)
GFR: 100.05 mL/min (ref >60.00)
Glucose, Bld: 148 mg/dL — ABNORMAL HIGH (ref 70–99)
Potassium: 4.1 mEq/L (ref 3.5–5.1)
Sodium: 141 mEq/L (ref 135–145)
Total Bilirubin: 0.4 mg/dL (ref 0.2–1.2)
Total Protein: 7.1 g/dL (ref 6.0–8.3)

## 2022-07-28 LAB — FOLLICLE STIMULATING HORMONE: FSH: 41.3 m[IU]/mL

## 2022-07-28 LAB — CBC
HCT: 38.9 % (ref 36.0–46.0)
Hemoglobin: 12.9 g/dL (ref 12.0–15.0)
MCHC: 33 g/dL (ref 30.0–36.0)
MCV: 83.3 fl (ref 78.0–100.0)
Platelets: 237 10*3/uL (ref 150.0–400.0)
RBC: 4.67 Mil/uL (ref 3.87–5.11)
RDW: 17.1 % — ABNORMAL HIGH (ref 11.5–15.5)
WBC: 7.3 10*3/uL (ref 4.0–10.5)

## 2022-07-28 LAB — HEMOGLOBIN A1C: Hgb A1c MFr Bld: 7.3 % — ABNORMAL HIGH (ref 4.6–6.5)

## 2022-07-28 MED ORDER — GLIPIZIDE ER 10 MG PO TB24
10.0000 mg | ORAL_TABLET | Freq: Every day | ORAL | 3 refills | Status: DC
  Filled 2022-07-28: qty 90, 90d supply, fill #0
  Filled 2022-11-01: qty 90, 90d supply, fill #1
  Filled 2023-01-26: qty 90, 90d supply, fill #2

## 2022-07-28 MED ORDER — DOXYCYCLINE HYCLATE 100 MG PO CAPS
100.0000 mg | ORAL_CAPSULE | Freq: Two times a day (BID) | ORAL | 0 refills | Status: DC
  Filled 2022-07-28: qty 10, 5d supply, fill #0

## 2022-08-06 ENCOUNTER — Other Ambulatory Visit: Payer: Self-pay

## 2022-08-06 ENCOUNTER — Encounter (HOSPITAL_BASED_OUTPATIENT_CLINIC_OR_DEPARTMENT_OTHER): Payer: Self-pay

## 2022-08-06 ENCOUNTER — Emergency Department (HOSPITAL_BASED_OUTPATIENT_CLINIC_OR_DEPARTMENT_OTHER)
Admission: EM | Admit: 2022-08-06 | Discharge: 2022-08-06 | Disposition: A | Discharge status: Home / Self Care | Payer: Managed Care, Other (non HMO) | Attending: Emergency Medicine | Admitting: Emergency Medicine

## 2022-08-06 DIAGNOSIS — Z1152 Encounter for screening for COVID-19: Secondary | ICD-10-CM | POA: Diagnosis not present

## 2022-08-06 DIAGNOSIS — R059 Cough, unspecified: Secondary | ICD-10-CM | POA: Diagnosis present

## 2022-08-06 DIAGNOSIS — I10 Essential (primary) hypertension: Secondary | ICD-10-CM | POA: Diagnosis not present

## 2022-08-06 DIAGNOSIS — Z79899 Other long term (current) drug therapy: Secondary | ICD-10-CM | POA: Insufficient documentation

## 2022-08-06 DIAGNOSIS — Z91040 Latex allergy status: Secondary | ICD-10-CM | POA: Diagnosis not present

## 2022-08-06 DIAGNOSIS — E119 Type 2 diabetes mellitus without complications: Secondary | ICD-10-CM | POA: Diagnosis not present

## 2022-08-06 DIAGNOSIS — J101 Influenza due to other identified influenza virus with other respiratory manifestations: Secondary | ICD-10-CM | POA: Insufficient documentation

## 2022-08-06 DIAGNOSIS — Z7984 Long term (current) use of oral hypoglycemic drugs: Secondary | ICD-10-CM | POA: Insufficient documentation

## 2022-08-06 DIAGNOSIS — J111 Influenza due to unidentified influenza virus with other respiratory manifestations: Secondary | ICD-10-CM

## 2022-08-06 LAB — RESP PANEL BY RT-PCR (RSV, FLU A&B, COVID)  RVPGX2
Influenza A by PCR: POSITIVE — AB
Influenza B by PCR: NEGATIVE
Resp Syncytial Virus by PCR: NEGATIVE
SARS Coronavirus 2 by RT PCR: NEGATIVE

## 2022-08-06 MED ORDER — ALBUTEROL SULFATE HFA 108 (90 BASE) MCG/ACT IN AERS
2.0000 | INHALATION_SPRAY | Freq: Once | RESPIRATORY_TRACT | Status: AC
  Administered 2022-08-06: 2 via RESPIRATORY_TRACT
  Filled 2022-08-06: qty 6.7

## 2022-08-06 NOTE — ED Provider Notes (Signed)
[facility] Provider Note   CSN: [id number] Arrival date & time: 08/06/22  1755     History  Chief Complaint  Patient presents with   Cough    Deborah [name] is a 52 y.o. female.   Cough      Home Medications Prior to Admission medications   Medication Sig Start Date End Date Taking? Authorizing Provider  albuterol (VENTOLIN HFA) 108 (90 Base) MCG/ACT inhaler Inhale 2 puffs into the lungs every 6 (six) hours as needed for wheezing or shortness of breath. Patient not taking: Reported on 07/28/2022 06/09/22   Biagio Borg, MD  allopurinol (ZYLOPRIM) 100 MG tablet Take 1 tablet (100 mg total) by mouth daily. 06/10/22   Copland, Gay Filler, MD  amLODipine (NORVASC) 10 MG tablet Take 1 tablet (10 mg total) by mouth daily. 06/10/22   Copland, Gay Filler, MD  blood glucose meter kit and supplies Dispense based on patient and insurance preference. Use up to four times daily as directed. (FOR ICD-10 E10.9, E11.9). Patient not taking: Reported on 07/28/2022 04/16/19   Copland, Gay Filler, MD  cetirizine (ZYRTEC) 10 MG tablet TAKE 1 TABLET BY MOUTH ONCE DAILY AS NEEDED FOR ALLERGIES 11/22/21 11/22/22  Copland, Gay Filler, MD  doxycycline (VIBRAMYCIN) 100 MG capsule Take 1 capsule (100 mg total) by mouth 2 (two) times daily. 07/28/22   Copland, Gay Filler, MD  fluticasone (FLONASE) 50 MCG/ACT nasal spray Place 2 sprays into both nostrils daily. Patient not taking: Reported on 07/28/2022 11/04/21   Copland, Gay Filler, MD  glipiZIDE (GLUCOTROL XL) 10 MG 24 hr tablet Take 1 tablet (10 mg total) by mouth daily with breakfast. 07/28/22 07/28/23  Copland, Gay Filler, MD  lisinopril (ZESTRIL) 10 MG tablet Take 1 tablet (10 mg total) by mouth daily. 06/10/22   Copland, Gay Filler, MD  metFORMIN (GLUCOPHAGE) 1000 MG tablet TAKE 1 TABLET BY MOUTH TWICE DAILY 11/04/21 11/04/22  Copland, Gay Filler, MD  promethazine-dextromethorphan (PROMETHAZINE-DM) 6.25-15 MG/5ML syrup Take 5 mLs  by mouth 4 (four) times daily as needed for cough. Patient not taking: Reported on 07/28/2022 01/24/22   Copland, Gay Filler, MD  sucralfate (CARAFATE) 1 g tablet Take 1 tablet (1 g total) by mouth 4 (four) times daily. Patient not taking: Reported on 07/28/2022 04/15/22   Mauri Pole, MD  SUMAtriptan (IMITREX) 50 MG tablet Take 1 tablet (50 mg total) by mouth once for 1 dose. May repeat in 2 hours if headache persists or recurs. 06/23/22 06/25/22  Copland, Gay Filler, MD  traMADol (ULTRAM) 50 MG tablet Take 1 tablet (50 mg total) by mouth every 6 (six) hours as needed. 07/20/22   Margarita Mail, PA-C      Allergies    Adhesive [tape], Cephalexin, Hydrocodone, Neomycin-bacitracin zn-polymyx, Ferrous sulfate, Venofer [iron sucrose], Sertraline, and Latex    Review of Systems   Review of Systems  Respiratory:  Positive for cough.     Physical Exam Updated Vital Signs BP (!) 139/91 (BP Location: Left Arm)   Pulse 98   Temp 98.7 F (37.1 C) (Oral)   Resp 18   Ht '4\' 11"'$  (1.499 m)   Wt 79.8 kg   LMP 04/18/2022   SpO2 100%   BMI 35.55 kg/m  Physical Exam  ED Results / Procedures / Treatments   Labs (all labs ordered are listed, but only abnormal results are displayed) Labs Reviewed  RESP PANEL BY RT-PCR (RSV, FLU A&B, COVID)  RVPGX2 -  Abnormal; Notable for the following components:      Result Value   Influenza A by PCR POSITIVE (*)    All other components within normal limits    EKG None  Radiology No results found.  Procedures Procedures  {Document cardiac monitor, telemetry assessment procedure when appropriate:1}  Medications Ordered in ED Medications - No data to display  ED Course/ Medical Decision Making/ A&P   {   Click here for ABCD2, HEART and other calculatorsREFRESH Note before signing :1}                          Medical Decision Making  ***  {Document critical care time when appropriate:1} {Document review of labs and clinical decision tools ie  heart score, Chads2Vasc2 etc:1}  {Document your independent review of radiology images, and any outside records:1} {Document your discussion with family members, caretakers, and with consultants:1} {Document social determinants of health affecting pt's care:1} {Document your decision making why or why not admission, treatments were needed:1} Final Clinical Impression(s) / ED Diagnoses Final diagnoses:  None    Rx / DC Orders ED Discharge Orders     None

## 2022-08-06 NOTE — Discharge Instructions (Signed)
You were seen in the ER for evaluation of you cold symptoms. You tested positive for flu. We have given you an albuterol inhaler to help with your cough. I would like for you to try alka selzter cold/flu OR tylenol cold/flu to help with your symptoms. You can try Robitussin as well. Please make sure you are staying well hydrated. Please make sure you are resting as well. If you have any concerns, new or worsening symptoms, please return to the nearest ER for re-evaluation.   Contact a health care provider if: You develop new symptoms. You have: Chest pain. Diarrhea. A fever. Your cough gets worse. You produce more mucus. You feel nauseous or you vomit. Get help right away if you: Develop shortness of breath or have difficulty breathing. Have skin or nails that turn a bluish color. Have severe pain or stiffness in your neck. Develop a sudden headache or sudden pain in your face or ear. Cannot eat or drink without vomiting. These symptoms may represent a serious problem that is an emergency. Do not wait to see if the symptoms will go away. Get medical help right away. Call your local emergency services (911 in the [place] Do not drive yourself to the hospital.

## 2022-08-06 NOTE — ED Triage Notes (Signed)
Tuesday started coughing and sneezing. Wednesday started having bodyaches. Home covid test negative.

## 2022-08-18 ENCOUNTER — Other Ambulatory Visit (HOSPITAL_BASED_OUTPATIENT_CLINIC_OR_DEPARTMENT_OTHER): Payer: Self-pay

## 2022-09-24 ENCOUNTER — Other Ambulatory Visit: Payer: Self-pay | Admitting: Family Medicine

## 2022-09-24 DIAGNOSIS — E118 Type 2 diabetes mellitus with unspecified complications: Secondary | ICD-10-CM

## 2022-09-24 DIAGNOSIS — M1A09X0 Idiopathic chronic gout, multiple sites, without tophus (tophi): Secondary | ICD-10-CM

## 2022-09-24 DIAGNOSIS — I10 Essential (primary) hypertension: Secondary | ICD-10-CM

## 2022-09-26 ENCOUNTER — Other Ambulatory Visit (HOSPITAL_BASED_OUTPATIENT_CLINIC_OR_DEPARTMENT_OTHER): Payer: Self-pay

## 2022-09-26 MED ORDER — LISINOPRIL 10 MG PO TABS
10.0000 mg | ORAL_TABLET | Freq: Every day | ORAL | 1 refills | Status: DC
  Filled 2022-09-26: qty 90, 90d supply, fill #0
  Filled 2022-12-27: qty 90, 90d supply, fill #1

## 2022-09-26 MED ORDER — AMLODIPINE BESYLATE 10 MG PO TABS
10.0000 mg | ORAL_TABLET | Freq: Every day | ORAL | 1 refills | Status: DC
  Filled 2022-09-26: qty 90, 90d supply, fill #0
  Filled 2022-12-27: qty 90, 90d supply, fill #1

## 2022-09-26 MED ORDER — ALLOPURINOL 100 MG PO TABS
100.0000 mg | ORAL_TABLET | Freq: Every day | ORAL | 1 refills | Status: DC
  Filled 2022-09-26: qty 90, 90d supply, fill #0
  Filled 2022-12-27: qty 90, 90d supply, fill #1

## 2022-11-08 ENCOUNTER — Other Ambulatory Visit (HOSPITAL_BASED_OUTPATIENT_CLINIC_OR_DEPARTMENT_OTHER): Payer: Self-pay | Admitting: Family Medicine

## 2022-11-08 DIAGNOSIS — Z1231 Encounter for screening mammogram for malignant neoplasm of breast: Secondary | ICD-10-CM

## 2022-11-12 ENCOUNTER — Emergency Department (HOSPITAL_BASED_OUTPATIENT_CLINIC_OR_DEPARTMENT_OTHER)
Admission: EM | Admit: 2022-11-12 | Discharge: 2022-11-13 | Disposition: A | Discharge status: Home / Self Care | Payer: Managed Care, Other (non HMO) | Attending: Emergency Medicine | Admitting: Emergency Medicine

## 2022-11-12 ENCOUNTER — Other Ambulatory Visit: Payer: Self-pay

## 2022-11-12 ENCOUNTER — Emergency Department (HOSPITAL_BASED_OUTPATIENT_CLINIC_OR_DEPARTMENT_OTHER): Payer: Managed Care, Other (non HMO)

## 2022-11-12 ENCOUNTER — Encounter (HOSPITAL_BASED_OUTPATIENT_CLINIC_OR_DEPARTMENT_OTHER): Payer: Self-pay

## 2022-11-12 DIAGNOSIS — J069 Acute upper respiratory infection, unspecified: Secondary | ICD-10-CM

## 2022-11-12 DIAGNOSIS — R052 Subacute cough: Secondary | ICD-10-CM

## 2022-11-12 DIAGNOSIS — Z91040 Latex allergy status: Secondary | ICD-10-CM | POA: Diagnosis not present

## 2022-11-12 DIAGNOSIS — J029 Acute pharyngitis, unspecified: Secondary | ICD-10-CM | POA: Diagnosis present

## 2022-11-12 DIAGNOSIS — Z1152 Encounter for screening for COVID-19: Secondary | ICD-10-CM | POA: Diagnosis not present

## 2022-11-12 LAB — GROUP A STREP BY PCR: Group A Strep by PCR: NOT DETECTED

## 2022-11-12 LAB — RESP PANEL BY RT-PCR (RSV, FLU A&B, COVID)  RVPGX2
Influenza A by PCR: NEGATIVE
Influenza B by PCR: NEGATIVE
Resp Syncytial Virus by PCR: NEGATIVE
SARS Coronavirus 2 by RT PCR: NEGATIVE

## 2022-11-12 MED ORDER — BENZONATATE 100 MG PO CAPS
100.0000 mg | ORAL_CAPSULE | Freq: Once | ORAL | Status: DC
  Filled 2022-11-12: qty 1

## 2022-11-12 MED ORDER — PROMETHAZINE-DM 6.25-15 MG/5ML PO SYRP
5.0000 mL | ORAL_SOLUTION | Freq: Four times a day (QID) | ORAL | 0 refills | Status: AC | PRN
Start: 2022-11-12 — End: ?

## 2022-11-12 MED ORDER — FLUTICASONE PROPIONATE 50 MCG/ACT NA SUSP: 2.0000 | Freq: Every day | NASAL | 0 refills | Status: DC

## 2022-11-12 MED ORDER — BENZONATATE 100 MG PO CAPS: 100.0000 mg | ORAL_CAPSULE | Freq: Three times a day (TID) | ORAL | 0 refills | Status: DC

## 2022-11-12 MED ORDER — CETIRIZINE HCL 10 MG PO TABS: 10.0000 mg | ORAL_TABLET | Freq: Every day | ORAL | 0 refills | Status: DC

## 2022-11-12 NOTE — ED Provider Notes (Signed)
[place] [facility] Provider Note   CSN: [id number] Arrival date & time: 11/12/22  2043     History  Chief Complaint  Patient presents with   Cough    Deborah [name] is a 52 y.o. female here for evaluation of URI symptoms.  Patient with cough, congestion, rhinorrhea, headache, sore throat over the last 2 days.  Using NyQuil, Sudafed, throat lozenges without relief.  No recent sick contacts.  Has had some chills without fever.  No chest pain, shortness of breath.  Cough is nonproductive.  Feels like her nasal drainage is dripping in the back of her throat.  She has decreased appetite however no vomiting.  No pain or swelling to lower legs.  No PND or orthopnea.  No hemoptysis.  HPI     Home Medications Prior to Admission medications   Medication Sig Start Date End Date Taking? Authorizing Provider  benzonatate (TESSALON) 100 MG capsule Take 1 capsule (100 mg total) by mouth every 8 (eight) hours. 11/12/22  Yes Rain Friedt A, PA-C  cetirizine (ZYRTEC ALLERGY) 10 MG tablet Take 1 tablet (10 mg total) by mouth daily. 11/12/22  Yes Nur Krasinski A, PA-C  fluticasone (FLONASE) 50 MCG/ACT nasal spray Place 2 sprays into both nostrils daily. 11/12/22  Yes Deajah Erkkila A, PA-C  allopurinol (ZYLOPRIM) 100 MG tablet Take 1 tablet (100 mg total) by mouth daily. 09/26/22   Copland, Gwenlyn Found, MD  amLODipine (NORVASC) 10 MG tablet Take 1 tablet (10 mg total) by mouth daily. 09/26/22   Copland, Gwenlyn Found, MD  doxycycline (VIBRAMYCIN) 100 MG capsule Take 1 capsule (100 mg total) by mouth 2 (two) times daily. 07/28/22   Copland, Gwenlyn Found, MD  glipiZIDE (GLUCOTROL XL) 10 MG 24 hr tablet Take 1 tablet (10 mg total) by mouth daily with breakfast. 07/28/22 07/28/23  Copland, Gwenlyn Found, MD  lisinopril (ZESTRIL) 10 MG tablet Take 1 tablet (10 mg total) by mouth daily. 09/26/22   Copland, Gwenlyn Found, MD  metFORMIN (GLUCOPHAGE) 1000 MG tablet TAKE 1 TABLET BY MOUTH TWICE  DAILY 11/04/21 11/16/22  Copland, Gwenlyn Found, MD  promethazine-dextromethorphan (PROMETHAZINE-DM) 6.25-15 MG/5ML syrup Take 5 mLs by mouth 4 (four) times daily as needed for cough. 11/12/22   Markella Dao A, PA-C  SUMAtriptan (IMITREX) 50 MG tablet Take 1 tablet (50 mg total) by mouth once for 1 dose. May repeat in 2 hours if headache persists or recurs. 06/23/22 06/25/22  Copland, Gwenlyn Found, MD  traMADol (ULTRAM) 50 MG tablet Take 1 tablet (50 mg total) by mouth every 6 (six) hours as needed. 07/20/22   Arthor Captain, PA-C      Allergies    Adhesive [tape], Cephalexin, Hydrocodone, Neomycin-bacitracin zn-polymyx, Ferrous sulfate, Venofer [iron sucrose], Sertraline, and Latex    Review of Systems   Review of Systems  Constitutional: Negative.   HENT:  Positive for congestion, postnasal drip, rhinorrhea, sinus pressure and sore throat.   Respiratory:  Positive for cough. Negative for apnea, choking, chest tightness, shortness of breath, wheezing and stridor.   Cardiovascular: Negative.   Gastrointestinal: Negative.   Genitourinary: Negative.   Musculoskeletal: Negative.   Skin: Negative.   Neurological: Negative.   All other systems reviewed and are negative.   Physical Exam Updated Vital Signs BP (!) 179/93 (BP Location: Right Arm)   Pulse 99   Temp 98.6 F (37 C) (Oral)   Resp 18   Ht 4\' 11"  (1.499 m)   Wt 81.6 kg  SpO2 98%   BMI 36.36 kg/m  Physical Exam Vitals and nursing note reviewed.  Constitutional:      General: She is not in acute distress.    Appearance: She is well-developed. She is not ill-appearing, toxic-appearing or diaphoretic.  HENT:     Head: Normocephalic and atraumatic.     Nose: Congestion and rhinorrhea present.     Mouth/Throat:     Comments: Posterior pharynx mildly erythematous, no exudates, uvula midline no pooling of secretions, no PTA, RPA Eyes:     Pupils: Pupils are equal, round, and reactive to light.  Neck:     Comments: Full range of  motion Cardiovascular:     Rate and Rhythm: Normal rate.     Pulses: Normal pulses.     Heart sounds: Normal heart sounds.  Pulmonary:     Effort: Pulmonary effort is normal. No respiratory distress.     Breath sounds: Normal breath sounds.     Comments: Nonproductive cough in room Abdominal:     General: There is no distension.     Palpations: Abdomen is soft.     Tenderness: There is no abdominal tenderness. There is no right CVA tenderness, left CVA tenderness, guarding or rebound.  Musculoskeletal:        General: Normal range of motion.     Cervical back: Normal range of motion and neck supple.  Skin:    General: Skin is warm and dry.  Neurological:     General: No focal deficit present.     Mental Status: She is alert.  Psychiatric:        Mood and Affect: Mood normal.     ED Results / Procedures / Treatments   Labs (all labs ordered are listed, but only abnormal results are displayed) Labs Reviewed  RESP PANEL BY RT-PCR (RSV, FLU A&B, COVID)  RVPGX2  GROUP A STREP BY PCR    EKG None  Radiology DG Chest 2 View  Result Date: 11/12/2022 CLINICAL DATA:  Cough, nasal drainage, headache and sore throat x2 days. EXAM: CHEST - 2 VIEW COMPARISON:  Oct 19, 2021 FINDINGS: The heart size and mediastinal contours are within normal limits. Both lungs are clear. The visualized skeletal structures are unremarkable. IMPRESSION: No active cardiopulmonary disease. Electronically Signed   By: Aram Candela M.D.   On: 11/12/2022 21:29    Procedures Procedures    Medications Ordered in ED Medications  benzonatate (TESSALON) capsule 100 mg (100 mg Oral Not Given 11/12/22 2232)    ED Course/ Medical Decision Making/ A&P   52 year old here for evaluation of URI symptoms over the last 3 days.  She is afebrile, nonseptic, not ill-appearing she has wet cough in room.  Does not appear grossly fluid overloaded.  Posterior pharynx clear.  Tolerating secretions.  Heart and lungs are  clear.  Abdomen soft, nontender.  No clinical evidence of VTE.  Wells criteria low risk.  Labs and imaging personally viewed and interpreted:  Strep negative COVID, flu, RSV negative Chest x-ray negative for pneumonia, edema, pneumothorax  Patient reassessed.  She is tolerating p.o. intake.  I discussed her labs and imaging.  Suspect likely viral illness.  Will DC home with symptomatic management.  Of note she did have elevated blood pressure here today.  Had not taken her home medication.  I offered medication which she declined.  Low suspicion for hypertensive urgency or emergency at this time.  The patient has been appropriately medically screened and/or stabilized in the ED.  I have low suspicion for any other emergent medical condition which would require further screening, evaluation or treatment in the ED or require inpatient management.  Patient is hemodynamically stable and in no acute distress.  Patient able to ambulate in department prior to ED.  Evaluation does not show acute pathology that would require ongoing or additional emergent interventions while in the emergency department or further inpatient treatment.  I have discussed the diagnosis with the patient and answered all questions.  Pain is been managed while in the emergency department and patient has no further complaints prior to discharge.  Patient is comfortable with plan discussed in room and is stable for discharge at this time.  I have discussed strict return precautions for returning to the emergency department.  Patient was encouraged to follow-up with PCP/specialist refer to at discharge.                              Medical Decision Making Amount and/or Complexity of Data Reviewed External Data Reviewed: labs, radiology and notes. Labs: ordered. Decision-making details documented in ED Course. Radiology: ordered and independent interpretation performed. Decision-making details documented in ED Course.  Risk OTC  drugs. Prescription drug management. Decision regarding hospitalization. Diagnosis or treatment significantly limited by social determinants of health.           Final Clinical Impression(s) / ED Diagnoses Final diagnoses:  Viral URI with cough    Rx / DC Orders ED Discharge Orders          Ordered    benzonatate (TESSALON) 100 MG capsule  Every 8 hours        11/12/22 2222    fluticasone (FLONASE) 50 MCG/ACT nasal spray  Daily        11/12/22 2222    cetirizine (ZYRTEC ALLERGY) 10 MG tablet  Daily        11/12/22 2222    promethazine-dextromethorphan (PROMETHAZINE-DM) 6.25-15 MG/5ML syrup  4 times daily PRN        11/12/22 2222              Janely Gullickson A, PA-C 11/12/22 2233    Cathren Laine, MD 11/13/22 2135

## 2022-11-12 NOTE — ED Triage Notes (Signed)
Pt reports cough, nasal drainage, headache, sore throat x2 days.

## 2022-11-12 NOTE — Discharge Instructions (Addendum)
It was a pleasure taking care of you here in the ED.  Your COVID, FLU, strep test was negative  Chets xray did not show pneumonia  This is likely a viral infection  We are treating your symptoms  It will take some time to get better

## 2022-11-12 NOTE — ED Notes (Signed)
Patient transported to X-ray 

## 2022-11-15 ENCOUNTER — Other Ambulatory Visit: Payer: Self-pay | Admitting: Family Medicine

## 2022-11-15 ENCOUNTER — Other Ambulatory Visit (HOSPITAL_BASED_OUTPATIENT_CLINIC_OR_DEPARTMENT_OTHER): Payer: Self-pay

## 2022-11-15 ENCOUNTER — Encounter: Payer: Self-pay | Admitting: Family Medicine

## 2022-11-15 ENCOUNTER — Other Ambulatory Visit (HOSPITAL_COMMUNITY): Payer: Self-pay

## 2022-11-15 ENCOUNTER — Encounter (HOSPITAL_COMMUNITY): Payer: Self-pay

## 2022-11-15 ENCOUNTER — Other Ambulatory Visit: Payer: Self-pay

## 2022-11-15 DIAGNOSIS — R052 Subacute cough: Secondary | ICD-10-CM

## 2022-11-15 NOTE — Telephone Encounter (Signed)
Pt was seen on 11/12/22 at the ED for Viral URI URI with cough and was given Benzonatate 100 mg, Flonase, Zyrtec and promethazine-dextromethorphan (PROMETHAZINE-DM) 6.25-15 MG/5ML syrup  4 times daily PRN. Please advise.

## 2022-11-16 ENCOUNTER — Other Ambulatory Visit (HOSPITAL_COMMUNITY): Payer: Self-pay

## 2022-11-17 ENCOUNTER — Other Ambulatory Visit (HOSPITAL_BASED_OUTPATIENT_CLINIC_OR_DEPARTMENT_OTHER): Payer: Self-pay

## 2022-11-17 ENCOUNTER — Other Ambulatory Visit: Payer: Self-pay | Admitting: Family Medicine

## 2022-11-17 ENCOUNTER — Encounter (HOSPITAL_BASED_OUTPATIENT_CLINIC_OR_DEPARTMENT_OTHER): Payer: Self-pay

## 2022-11-17 ENCOUNTER — Ambulatory Visit (HOSPITAL_BASED_OUTPATIENT_CLINIC_OR_DEPARTMENT_OTHER)
Admission: RE | Admit: 2022-11-17 | Discharge: 2022-11-17 | Disposition: A | Discharge status: Home / Self Care | Payer: Managed Care, Other (non HMO) | Source: Ambulatory Visit | Attending: Family Medicine | Admitting: Family Medicine

## 2022-11-17 DIAGNOSIS — E118 Type 2 diabetes mellitus with unspecified complications: Secondary | ICD-10-CM

## 2022-11-17 DIAGNOSIS — Z1231 Encounter for screening mammogram for malignant neoplasm of breast: Secondary | ICD-10-CM | POA: Insufficient documentation

## 2022-11-17 MED ORDER — METFORMIN HCL 1000 MG PO TABS
1000.0000 mg | ORAL_TABLET | Freq: Two times a day (BID) | ORAL | 0 refills | Status: DC
Start: 2022-11-17 — End: 2023-02-14
  Filled 2022-11-17: qty 180, 90d supply, fill #0

## 2022-12-27 ENCOUNTER — Other Ambulatory Visit (HOSPITAL_BASED_OUTPATIENT_CLINIC_OR_DEPARTMENT_OTHER): Payer: Self-pay

## 2023-02-14 ENCOUNTER — Other Ambulatory Visit: Payer: Self-pay | Admitting: Family Medicine

## 2023-02-14 DIAGNOSIS — E118 Type 2 diabetes mellitus with unspecified complications: Secondary | ICD-10-CM

## 2023-02-16 ENCOUNTER — Other Ambulatory Visit (HOSPITAL_BASED_OUTPATIENT_CLINIC_OR_DEPARTMENT_OTHER): Payer: Self-pay

## 2023-02-16 MED ORDER — COVID-19 MRNA VAC-TRIS(PFIZER) 30 MCG/0.3ML IM SUSY
0.3000 mL | PREFILLED_SYRINGE | Freq: Once | INTRAMUSCULAR | 0 refills | Status: AC
  Filled 2023-02-16: qty 0.3, 1d supply, fill #0

## 2023-02-16 MED ORDER — INFLUENZA VIRUS VACC SPLIT PF (FLUZONE) 0.5 ML IM SUSY
0.5000 mL | PREFILLED_SYRINGE | Freq: Once | INTRAMUSCULAR | 0 refills | Status: AC
  Filled 2023-02-16: qty 0.5, 1d supply, fill #0

## 2023-02-16 MED ORDER — METFORMIN HCL 1000 MG PO TABS
1000.0000 mg | ORAL_TABLET | Freq: Two times a day (BID) | ORAL | 0 refills | Status: AC
Start: 2023-02-16 — End: ?
  Filled 2023-02-16: qty 60, 30d supply, fill #0

## 2023-03-06 NOTE — Progress Notes (Unsigned)
[facility] [place] [address], [address] [place], [place] [address] [phone]  Date:  03/09/2023   Name:  Deborah [name]   DOB:  Jun 20, 1970   MRN:  [id number]  PCP:  Pearline Cables, MD    Chief Complaint: No chief complaint on file.   History of Present Illness:  Deborah [name] is a 52 y.o. very pleasant female patient who presents with the following:  Patient seen today for periodic follow-up Most recent visit with myself was in February History of diabetes, dyslipidemia, hypertension, anemia related to menstrual blood losses -now hopefully in menopause, gout  Lab Results  Component Value Date   HGBA1C 7.3 (H) 07/28/2022   Due for foot exam Urine micro is due Can update labs today  Allopurinol Amlodipine 10 Lisinopril 10 Metformin one thousand twice daily Imitrex Tramadol  Patient Active Problem List   Diagnosis Date Noted   COVID-19 virus infection 06/09/2022   Diabetes (HCC) 10/05/2015   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   MUSCULOSKELETAL PAIN 10/01/2009   Hyperlipidemia associated with type 2 diabetes mellitus (HCC) 12/05/2008   Fatty liver 10/20/2008   NUMMULAR ECZEMA 05/22/2008   FEMALE INFERTILITY 05/01/2007   ANEMIA, IRON DEFICIENCY 04/03/2007   SMOKER 11/28/2006   HYPERTENSION, MILD 11/28/2006   HEMORRHOIDS, INTERNAL W/O COMPLICATION 11/28/2006   RHINITIS, ALLERGIC, DUE TO POLLEN 11/28/2006   GERD 11/28/2006   DERMATITIS, ATOPIC 11/28/2006   COCAINE ABUSE, HX OF 11/28/2006   LIVER FUNCTION TESTS, ABNORMAL 09/14/2006   GOUT 06/25/2004   MORTON'S NEUROMA, 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    Past Medical History:  Diagnosis Date   Allergy    Anemia    Arthritis    Diabetes mellitus    Eczema    GERD (gastroesophageal reflux disease)    Gout    Hyperlipidemia    no meds   Hypertension    Neuromuscular disorder (HCC)    Plantar fasciitis    Polysubstance abuse (HCC)    ETOH and Cocaine   Seasonal  allergies     Past Surgical History:  Procedure Laterality Date   ANAL RECTAL MANOMETRY N/A 04/20/2020   Procedure: ANO RECTAL MANOMETRY;  Surgeon: Andria Meuse, MD;  Location: [facility];  Service: General;  Laterality: N/A;   HEMORRHOID SURGERY      Social History   Tobacco Use   Smoking status: Former    Current packs/day: 0.00    Types: Cigarettes    Quit date: 04/24/2013    Years since quitting: 9.8   Smokeless tobacco: Former    Quit date: 03/14/2013  Vaping Use   Vaping status: Never Used  Substance Use Topics   Alcohol use: No    Comment: none since 03-14-13   Drug use: Not Currently    Types: Marijuana, Cocaine    Comment: Quit 04/14/2013 per pt.     Family History  Problem Relation Age of Onset   Hypertension Father    Diabetes Father    Cancer Father    Hypertension Other    Diabetes Other    Diabetes Mother    Hypertension Mother    Diabetes Maternal Grandmother    Hypertension Maternal Grandmother    Diabetes Paternal Grandfather    Colon cancer Neg Hx    Colon polyps Neg Hx    Esophageal cancer Neg Hx    Rectal cancer Neg Hx    Stomach cancer Neg Hx     Allergies  Allergen  Reactions   Adhesive [Tape] Hives and Rash   Cephalexin Hives and Rash    REACTION: stomach cramps and hives   Hydrocodone Other (See Comments)    headache   Neomycin-Bacitracin Zn-Polymyx Hives and Rash    REACTION: burning   Ferrous Sulfate Other (See Comments)    Iron Infusion caused pt to pass out due to blood pressure drop    Venofer [Iron Sucrose] Other (See Comments)    Per report, "Pt lethargic and slumped over to side of chair. BP low. Pt states that she was nauseated and hot."    Sertraline Hives and Itching   Latex Itching and Rash    Medication list has been reviewed and updated.  Current Outpatient Medications on File Prior to Visit  Medication Sig Dispense Refill   allopurinol (ZYLOPRIM) 100 MG tablet Take 1 tablet (100 mg total) by mouth  daily. 90 tablet 1   amLODipine (NORVASC) 10 MG tablet Take 1 tablet (10 mg total) by mouth daily. 90 tablet 1   benzonatate (TESSALON) 100 MG capsule Take 1 capsule (100 mg total) by mouth every 8 (eight) hours. 21 capsule 0   cetirizine (ZYRTEC ALLERGY) 10 MG tablet Take 1 tablet (10 mg total) by mouth daily. 30 tablet 0   doxycycline (VIBRAMYCIN) 100 MG capsule Take 1 capsule (100 mg total) by mouth 2 (two) times daily. 10 capsule 0   fluticasone (FLONASE) 50 MCG/ACT nasal spray Place 2 sprays into both nostrils daily. 9.9 mL 0   glipiZIDE (GLUCOTROL XL) 10 MG 24 hr tablet Take 1 tablet (10 mg total) by mouth daily with breakfast. 90 tablet 3   lisinopril (ZESTRIL) 10 MG tablet Take 1 tablet (10 mg total) by mouth daily. 90 tablet 1   metFORMIN (GLUCOPHAGE) 1000 MG tablet Take 1 tablet (1,000 mg total) by mouth 2 (two) times daily. 60 tablet 0   promethazine-dextromethorphan (PROMETHAZINE-DM) 6.25-15 MG/5ML syrup Take 5 mLs by mouth 4 (four) times daily as needed for cough. 118 mL 0   SUMAtriptan (IMITREX) 50 MG tablet Take 1 tablet (50 mg total) by mouth once for 1 dose. May repeat in 2 hours if headache persists or recurs. 10 tablet 0   traMADol (ULTRAM) 50 MG tablet Take 1 tablet (50 mg total) by mouth every 6 (six) hours as needed. 15 tablet 0   No current facility-administered medications on file prior to visit.    Review of Systems:  As per HPI- otherwise negative.   Physical Examination: There were no vitals filed for this visit. There were no vitals filed for this visit. There is no height or weight on file to calculate BMI. Ideal Body Weight:    GEN: no acute distress. HEENT: Atraumatic, Normocephalic.  Ears and Nose: No external deformity. CV: RRR, No M/G/R. No JVD. No thrill. No extra heart sounds. PULM: CTA B, no wheezes, crackles, rhonchi. No retractions. No resp. distress. No accessory muscle use. ABD: S, NT, ND, +BS. No rebound. No HSM. EXTR: No c/c/e PSYCH:  Normally interactive. Conversant.  Foot exam  Assessment and Plan: ***  Signed Abbe Amsterdam, MD

## 2023-03-06 NOTE — Patient Instructions (Incomplete)
It was good to see you again today, I will be in touch with your labs  Assuming all is well, please see me in about 6 months  We will try treating you for a sinus infection with doxycycline- please let me know if this fails to clear up your increased headaches

## 2023-03-09 ENCOUNTER — Encounter: Payer: Self-pay | Admitting: Family Medicine

## 2023-03-09 ENCOUNTER — Other Ambulatory Visit (HOSPITAL_BASED_OUTPATIENT_CLINIC_OR_DEPARTMENT_OTHER): Payer: Self-pay

## 2023-03-09 ENCOUNTER — Ambulatory Visit: Payer: Managed Care, Other (non HMO) | Admitting: Family Medicine

## 2023-03-09 VITALS — BP 122/60 | HR 72 | Temp 97.8°F | Resp 18 | Ht 59.0 in | Wt 175.8 lb

## 2023-03-09 DIAGNOSIS — E785 Hyperlipidemia, unspecified: Secondary | ICD-10-CM | POA: Diagnosis not present

## 2023-03-09 DIAGNOSIS — E559 Vitamin D deficiency, unspecified: Secondary | ICD-10-CM | POA: Diagnosis not present

## 2023-03-09 DIAGNOSIS — E118 Type 2 diabetes mellitus with unspecified complications: Secondary | ICD-10-CM

## 2023-03-09 DIAGNOSIS — E119 Type 2 diabetes mellitus without complications: Secondary | ICD-10-CM

## 2023-03-09 DIAGNOSIS — Z1329 Encounter for screening for other suspected endocrine disorder: Secondary | ICD-10-CM

## 2023-03-09 DIAGNOSIS — Z7984 Long term (current) use of oral hypoglycemic drugs: Secondary | ICD-10-CM

## 2023-03-09 DIAGNOSIS — R052 Subacute cough: Secondary | ICD-10-CM

## 2023-03-09 DIAGNOSIS — J0110 Acute frontal sinusitis, unspecified: Secondary | ICD-10-CM

## 2023-03-09 DIAGNOSIS — D509 Iron deficiency anemia, unspecified: Secondary | ICD-10-CM | POA: Diagnosis not present

## 2023-03-09 DIAGNOSIS — E1169 Type 2 diabetes mellitus with other specified complication: Secondary | ICD-10-CM

## 2023-03-09 DIAGNOSIS — I10 Essential (primary) hypertension: Secondary | ICD-10-CM

## 2023-03-09 DIAGNOSIS — M1A09X0 Idiopathic chronic gout, multiple sites, without tophus (tophi): Secondary | ICD-10-CM

## 2023-03-09 LAB — CBC
HCT: 38.6 % (ref 36.0–46.0)
Hemoglobin: 12.4 g/dL (ref 12.0–15.0)
MCHC: 32.2 g/dL (ref 30.0–36.0)
MCV: 86.4 fL (ref 78.0–100.0)
Platelets: 243 10*3/uL (ref 150.0–400.0)
RBC: 4.47 Mil/uL (ref 3.87–5.11)
RDW: 14 % (ref 11.5–15.5)
WBC: 8.8 10*3/uL (ref 4.0–10.5)

## 2023-03-09 LAB — COMPREHENSIVE METABOLIC PANEL
ALT: 23 U/L (ref 0–35)
AST: 22 U/L (ref 0–37)
Albumin: 4.4 g/dL (ref 3.5–5.2)
Alkaline Phosphatase: 98 U/L (ref 39–117)
BUN: 13 mg/dL (ref 6–23)
CO2: 28 meq/L (ref 19–32)
Calcium: 9.8 mg/dL (ref 8.4–10.5)
Chloride: 101 meq/L (ref 96–112)
Creatinine, Ser: 0.66 mg/dL (ref 0.40–1.20)
GFR: 99.26 mL/min (ref >60.00)
Glucose, Bld: 157 mg/dL — ABNORMAL HIGH (ref 70–99)
Potassium: 4.1 meq/L (ref 3.5–5.1)
Sodium: 139 meq/L (ref 135–145)
Total Bilirubin: 0.3 mg/dL (ref 0.2–1.2)
Total Protein: 7.1 g/dL (ref 6.0–8.3)

## 2023-03-09 LAB — LIPID PANEL
Cholesterol: 143 mg/dL (ref 0–200)
HDL: 81.7 mg/dL (ref >39.00)
LDL Cholesterol: 46 mg/dL (ref 0–99)
NonHDL: 61.19
Total CHOL/HDL Ratio: 2
Triglycerides: 74 mg/dL (ref 0.0–149.0)
VLDL: 14.8 mg/dL (ref 0.0–40.0)

## 2023-03-09 LAB — HEMOGLOBIN A1C: Hgb A1c MFr Bld: 7.5 % — ABNORMAL HIGH (ref 4.6–6.5)

## 2023-03-09 LAB — TSH: TSH: 1.73 u[IU]/mL (ref 0.35–5.50)

## 2023-03-09 LAB — MICROALBUMIN / CREATININE URINE RATIO
Creatinine,U: 156.1 mg/dL
Microalb Creat Ratio: 1.6 mg/g (ref 0.0–30.0)
Microalb, Ur: 2.5 mg/dL — ABNORMAL HIGH (ref 0.0–1.9)

## 2023-03-09 LAB — VITAMIN D 25 HYDROXY (VIT D DEFICIENCY, FRACTURES): VITD: 26.57 ng/mL — ABNORMAL LOW (ref 30.00–100.00)

## 2023-03-09 MED ORDER — AMLODIPINE BESYLATE 10 MG PO TABS
10.0000 mg | ORAL_TABLET | Freq: Every day | ORAL | 3 refills | Status: DC
Start: 2023-03-09 — End: 2024-03-23
  Filled 2023-03-09 – 2023-03-23 (×2): qty 90, 90d supply, fill #0
  Filled 2023-06-21 – 2023-06-23 (×2): qty 90, 90d supply, fill #1
  Filled 2023-09-18: qty 90, 90d supply, fill #2
  Filled 2023-12-23: qty 90, 90d supply, fill #3

## 2023-03-09 MED ORDER — METFORMIN HCL 1000 MG PO TABS
1000.0000 mg | ORAL_TABLET | Freq: Two times a day (BID) | ORAL | 3 refills | Status: DC
Start: 2023-03-09 — End: 2024-04-08
  Filled 2023-03-09 – 2023-03-23 (×2): qty 180, 90d supply, fill #0
  Filled 2023-06-21 – 2023-06-23 (×2): qty 180, 90d supply, fill #1
  Filled 2023-09-18: qty 180, 90d supply, fill #2
  Filled 2023-12-24: qty 180, 90d supply, fill #3

## 2023-03-09 MED ORDER — GLIPIZIDE ER 10 MG PO TB24
10.0000 mg | ORAL_TABLET | Freq: Every day | ORAL | 3 refills | Status: DC
  Filled 2023-03-09 – 2023-05-08 (×3): qty 90, 90d supply, fill #0
  Filled 2023-08-09: qty 90, 90d supply, fill #1
  Filled 2023-09-18 – 2023-10-28 (×2): qty 90, 90d supply, fill #2
  Filled 2023-12-24 – 2024-02-03 (×2): qty 90, 90d supply, fill #3

## 2023-03-09 MED ORDER — PROMETHAZINE-DM 6.25-15 MG/5ML PO SYRP
5.0000 mL | ORAL_SOLUTION | Freq: Four times a day (QID) | ORAL | 0 refills | Status: DC | PRN
  Filled 2023-03-09: qty 118, 6d supply, fill #0

## 2023-03-09 MED ORDER — ALLOPURINOL 100 MG PO TABS
100.0000 mg | ORAL_TABLET | Freq: Every day | ORAL | 3 refills | Status: DC
Start: 2023-03-09 — End: 2024-03-23
  Filled 2023-03-09 – 2023-03-23 (×2): qty 90, 90d supply, fill #0
  Filled 2023-06-21 – 2023-06-23 (×2): qty 90, 90d supply, fill #1
  Filled 2023-09-18: qty 90, 90d supply, fill #2
  Filled 2023-12-23: qty 90, 90d supply, fill #3

## 2023-03-09 MED ORDER — DOXYCYCLINE HYCLATE 100 MG PO TABS
100.0000 mg | ORAL_TABLET | Freq: Two times a day (BID) | ORAL | 0 refills | Status: DC
Start: 2023-03-09 — End: 2023-09-24
  Filled 2023-03-09: qty 20, 10d supply, fill #0

## 2023-03-09 MED ORDER — LISINOPRIL 10 MG PO TABS
10.0000 mg | ORAL_TABLET | Freq: Every day | ORAL | 3 refills | Status: DC
Start: 2023-03-09 — End: 2024-03-23
  Filled 2023-03-09 – 2023-03-23 (×2): qty 90, 90d supply, fill #0
  Filled 2023-06-21 – 2023-06-23 (×2): qty 90, 90d supply, fill #1
  Filled 2023-09-18: qty 90, 90d supply, fill #2
  Filled 2023-12-23: qty 90, 90d supply, fill #3

## 2023-03-21 LAB — HM DIABETES EYE EXAM

## 2023-03-23 ENCOUNTER — Other Ambulatory Visit (HOSPITAL_BASED_OUTPATIENT_CLINIC_OR_DEPARTMENT_OTHER): Payer: Self-pay

## 2023-05-08 ENCOUNTER — Other Ambulatory Visit (HOSPITAL_BASED_OUTPATIENT_CLINIC_OR_DEPARTMENT_OTHER): Payer: Self-pay

## 2023-05-08 ENCOUNTER — Other Ambulatory Visit: Payer: Self-pay

## 2023-05-12 ENCOUNTER — Other Ambulatory Visit (HOSPITAL_BASED_OUTPATIENT_CLINIC_OR_DEPARTMENT_OTHER): Payer: Self-pay

## 2023-06-21 ENCOUNTER — Other Ambulatory Visit: Payer: Self-pay

## 2023-06-22 ENCOUNTER — Other Ambulatory Visit (HOSPITAL_COMMUNITY): Payer: Self-pay

## 2023-06-22 ENCOUNTER — Other Ambulatory Visit (HOSPITAL_BASED_OUTPATIENT_CLINIC_OR_DEPARTMENT_OTHER): Payer: Self-pay

## 2023-06-23 ENCOUNTER — Other Ambulatory Visit (HOSPITAL_COMMUNITY): Payer: Self-pay

## 2023-06-23 ENCOUNTER — Other Ambulatory Visit: Payer: Self-pay

## 2023-06-23 ENCOUNTER — Other Ambulatory Visit (HOSPITAL_BASED_OUTPATIENT_CLINIC_OR_DEPARTMENT_OTHER): Payer: Self-pay

## 2023-06-27 ENCOUNTER — Encounter: Payer: Self-pay | Admitting: Family Medicine

## 2023-06-27 ENCOUNTER — Other Ambulatory Visit (HOSPITAL_BASED_OUTPATIENT_CLINIC_OR_DEPARTMENT_OTHER): Payer: Self-pay

## 2023-06-27 MED ORDER — ONETOUCH VERIO VI STRP
1.0000 | ORAL_STRIP | Freq: Every day | 12 refills | Status: DC
  Filled 2023-06-27 – 2023-12-24 (×3): qty 100, 90d supply, fill #0

## 2023-06-27 MED ORDER — ONETOUCH VERIO FLEX SYSTEM W/DEVICE KIT
1.0000 | PACK | Freq: Every day | 0 refills | Status: DC
  Filled 2023-06-27 – 2023-12-24 (×4): qty 1, 30d supply, fill #0

## 2023-06-27 MED ORDER — ONETOUCH ULTRASOFT 2 LANCETS MISC
1.0000 | Freq: Every day | 12 refills | Status: DC
  Filled 2023-06-27 – 2023-12-24 (×4): qty 100, 90d supply, fill #0

## 2023-06-28 ENCOUNTER — Other Ambulatory Visit (HOSPITAL_BASED_OUTPATIENT_CLINIC_OR_DEPARTMENT_OTHER): Payer: Self-pay

## 2023-07-10 ENCOUNTER — Other Ambulatory Visit (HOSPITAL_BASED_OUTPATIENT_CLINIC_OR_DEPARTMENT_OTHER): Payer: Self-pay

## 2023-07-18 ENCOUNTER — Other Ambulatory Visit: Payer: Self-pay

## 2023-07-18 ENCOUNTER — Other Ambulatory Visit (HOSPITAL_BASED_OUTPATIENT_CLINIC_OR_DEPARTMENT_OTHER): Payer: Self-pay

## 2023-07-18 ENCOUNTER — Other Ambulatory Visit: Payer: Self-pay | Admitting: Family Medicine

## 2023-07-18 MED ORDER — SUMATRIPTAN SUCCINATE 50 MG PO TABS
50.0000 mg | ORAL_TABLET | ORAL | 3 refills | Status: AC
  Filled 2023-07-18 – 2024-05-14 (×4): qty 9, 30d supply, fill #0

## 2023-07-31 ENCOUNTER — Other Ambulatory Visit (HOSPITAL_BASED_OUTPATIENT_CLINIC_OR_DEPARTMENT_OTHER): Payer: Self-pay

## 2023-08-09 ENCOUNTER — Other Ambulatory Visit (HOSPITAL_BASED_OUTPATIENT_CLINIC_OR_DEPARTMENT_OTHER): Payer: Self-pay

## 2023-08-10 ENCOUNTER — Other Ambulatory Visit (HOSPITAL_BASED_OUTPATIENT_CLINIC_OR_DEPARTMENT_OTHER): Payer: Self-pay

## 2023-08-21 ENCOUNTER — Other Ambulatory Visit (HOSPITAL_BASED_OUTPATIENT_CLINIC_OR_DEPARTMENT_OTHER): Payer: Self-pay

## 2023-09-19 ENCOUNTER — Other Ambulatory Visit (HOSPITAL_BASED_OUTPATIENT_CLINIC_OR_DEPARTMENT_OTHER): Payer: Self-pay

## 2023-09-19 ENCOUNTER — Other Ambulatory Visit: Payer: Self-pay

## 2023-09-24 NOTE — Progress Notes (Signed)
 [facility] [address] [place], [place] [address] [phone]  Date:  10/02/2023   Name:  Deborah [name]   DOB:  1970/07/19   MRN:  [id number]  PCP:  Kaylee Partridge, MD    Chief Complaint: DM follow up (Concerns/ questions: 1 TB test. 2. Pt asks how long will she need to take Vit D3, Benefiber? 3. Can she have a Rx for iron  tabs? /Due: A1C, PVC)   History of Present Illness:  Deborah [name] is a 53 y.o. very pleasant female patient who presents with the following:  Patient seen today for follow-up of her diabetes.  I saw her most recently in October;  History of diabetes, dyslipidemia, hypertension, anemia related to menstrual blood losses now menopausal, gout   Most recent A1c 7.5-acceptable although above goal for age.  I suggested starting a GLP-1 agonist but she has not yet gotten back with me  She is telling social work, she is at school at [employer]; needs TB screening - will do quantiferon blood test She gets her eyes examined on a regular basis   Her main concern right now is seasonal allergies; she is using zyrtec  and flonase , and cough syrup She notes she tends to get seasonal allergies annually, worse in the spring It is getting worse every year She would like to get allergy shots   Allopurinol  Amlodipine  Glipizide  10 Lisinopril  10 Metformin  tab twice daily  Lab Results  Component Value Date   HGBA1C 7.5 (H) 03/09/2023   A1c can be updated Patient Active Problem List   Diagnosis Date Noted   COVID-19 virus infection 06/09/2022   Diabetes (HCC) 10/05/2015   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   MUSCULOSKELETAL PAIN 10/01/2009   Hyperlipidemia associated with type 2 diabetes mellitus (HCC) 12/05/2008   Fatty liver 10/20/2008   NUMMULAR ECZEMA 05/22/2008   FEMALE INFERTILITY 05/01/2007   ANEMIA, IRON  DEFICIENCY 04/03/2007   SMOKER 11/28/2006   HYPERTENSION, MILD 11/28/2006   HEMORRHOIDS,  INTERNAL W/O COMPLICATION 11/28/2006   RHINITIS, ALLERGIC, DUE TO POLLEN 11/28/2006   GERD 11/28/2006   DERMATITIS, ATOPIC 11/28/2006   COCAINE ABUSE, HX OF 11/28/2006   LIVER FUNCTION TESTS, ABNORMAL 09/14/2006   GOUT 06/25/2004   MORTON'S NEUROMA, 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    Past Medical History:  Diagnosis Date   Allergy    Anemia    Arthritis    Diabetes mellitus    Eczema    GERD (gastroesophageal reflux disease)    Gout    Hyperlipidemia    no meds   Hypertension    Neuromuscular disorder (HCC)    Plantar fasciitis    Polysubstance abuse (HCC)    ETOH and Cocaine   Seasonal allergies     Past Surgical History:  Procedure Laterality Date   ANAL RECTAL MANOMETRY N/A 04/20/2020   Procedure: ANO RECTAL MANOMETRY;  Surgeon: Melvenia Stabs, MD;  Location: [facility];  Service: General;  Laterality: N/A;   HEMORRHOID SURGERY      Social History   Tobacco Use   Smoking status: Former    Current packs/day: 0.00    Types: Cigarettes    Quit date: 04/24/2013    Years since quitting: 10.4   Smokeless tobacco: Former    Quit date: 03/14/2013  Vaping Use   Vaping status: Never Used  Substance Use Topics   Alcohol use: No    Comment: none since 03-14-13   Drug use: Not  Currently    Types: Marijuana, Cocaine    Comment: Quit 04/14/2013 per pt.     Family History  Problem Relation Age of Onset   Hypertension Father    Diabetes Father    Cancer Father    Hypertension Other    Diabetes Other    Diabetes Mother    Hypertension Mother    Diabetes Maternal Grandmother    Hypertension Maternal Grandmother    Diabetes Paternal Grandfather    Colon cancer Neg Hx    Colon polyps Neg Hx    Esophageal cancer Neg Hx    Rectal cancer Neg Hx    Stomach cancer Neg Hx     Allergies  Allergen Reactions   Adhesive [Tape] Hives and Rash   Cephalexin Hives and Rash    REACTION: stomach cramps and hives   Hydrocodone  Other (See Comments)    headache    Neomycin-Bacitracin Zn-Polymyx Hives and Rash    REACTION: burning   Ferrous Sulfate Other (See Comments)    Iron  Infusion caused pt to pass out due to blood pressure drop    Venofer  [Iron  Sucrose] Other (See Comments)    Per report, "Pt lethargic and slumped over to side of chair. BP low. Pt states that she was nauseated and hot."    Sertraline Hives and Itching   Latex Itching and Rash    Medication list has been reviewed and updated.  Current Outpatient Medications on File Prior to Visit  Medication Sig Dispense Refill   allopurinol  (ZYLOPRIM ) 100 MG tablet Take 1 tablet (100 mg total) by mouth daily. 90 tablet 3   amLODipine  (NORVASC ) 10 MG tablet Take 1 tablet (10 mg total) by mouth daily. 90 tablet 3   Blood Glucose Monitoring Suppl (ONETOUCH VERIO FLEX SYSTEM) w/Device KIT Use to check blood sugars once daily. 1 kit 0   cetirizine  (ZYRTEC  ALLERGY) 10 MG tablet Take 1 tablet (10 mg total) by mouth daily. 30 tablet 0   glipiZIDE  (GLUCOTROL  XL) 10 MG 24 hr tablet Take 1 tablet (10 mg total) by mouth daily with breakfast. 90 tablet 3   glucose blood (ONETOUCH VERIO) test strip Use to check blood sugars once daily. 100 each 12   Lancets (ONETOUCH ULTRASOFT) lancets Use to check blood sugars once daily. 100 each 12   lisinopril  (ZESTRIL ) 10 MG tablet Take 1 tablet (10 mg total) by mouth daily. 90 tablet 3   metFORMIN  (GLUCOPHAGE ) 1000 MG tablet Take 1 tablet (1,000 mg total) by mouth 2 (two) times daily. 180 tablet 3   promethazine -dextromethorphan (PROMETHAZINE -DM) 6.25-15 MG/5ML syrup Take 5 mLs by mouth 4 (four) times daily as needed for cough. 118 mL 0   SUMAtriptan  (IMITREX ) 50 MG tablet Take 1 tablet (50 mg total) by mouth as directed. May repeat in 2 hours if headache persists or recurs. 10 tablet 3   No current facility-administered medications on file prior to visit.    Review of Systems:  As per HPI- otherwise negative.   Physical Examination: Vitals:   10/02/23 0841   BP: 132/80  Pulse: 63  Resp: 18  Temp: 97.9 F (36.6 C)  SpO2: 98%   Vitals:   10/02/23 0841  Weight: 180 lb 6.4 oz (81.8 kg)  Height: 4\' 11"  (1.499 m)   Body mass index is 36.44 kg/m. Ideal Body Weight: Weight in (lb) to have BMI = 25: 123.5  GEN: no acute distress.  Obese, looks well  HEENT: Atraumatic, Normocephalic.  Bilateral TM wnl, oropharynx  normal.  PEERL,EOMI.   Ears and Nose: No external deformity. CV: RRR, No M/G/R. No JVD. No thrill. No extra heart sounds. PULM: CTA B, no wheezes, crackles, rhonchi. No retractions. No resp. distress. No accessory muscle use. ABD: S, NT, ND, +BS. No rebound. No HSM. EXTR: No c/c/e PSYCH: Normally interactive. Conversant.    Assessment and Plan: Type 2 diabetes mellitus without complication, without long-term current use of insulin (HCC) - Plan: Basic metabolic panel with GFR, Hemoglobin A1c  Hyperlipidemia associated with type 2 diabetes mellitus (HCC)  HYPERTENSION, MILD  Iron  deficiency anemia, unspecified iron  deficiency anemia type - Plan: Ferritin, CBC, CANCELED: CBC  Screening examination for pulmonary tuberculosis - Plan: QuantiFERON-TB Gold Plus  Seasonal allergic rhinitis due to pollen - Plan: montelukast (SINGULAIR) 10 MG tablet  Patient seen today for follow-up.  Will assess her diabetes control as above.  She is not on a statin and likely needs to be.  Blood pressure is under good control QuantiFERON gold pending for tuberculosis screening She has had a lot of issues with allergies, but does not wish to do immunotherapy at this time.  Will have her try adding Singulair  Signed Gates Kasal, MD  Received labs, message to patient  Results for orders placed or performed in visit on 10/02/23  Basic metabolic panel with GFR   Collection Time: 10/02/23  9:09 AM  Result Value Ref Range   Sodium 139 135 - 145 mEq/L   Potassium 4.0 3.5 - 5.1 mEq/L   Chloride 102 96 - 112 mEq/L   CO2 26 19 - 32 mEq/L    Glucose, Bld 147 (H) 70 - 99 mg/dL   BUN 10 6 - 23 mg/dL   Creatinine, Ser 4.09 0.40 - 1.20 mg/dL   GFR 811.91 >47.82 mL/min   Calcium  9.6 8.4 - 10.5 mg/dL  Hemoglobin N5A   Collection Time: 10/02/23  9:09 AM  Result Value Ref Range   Hgb A1c MFr Bld 7.9 (H) 4.6 - 6.5 %  Ferritin   Collection Time: 10/02/23  9:09 AM  Result Value Ref Range   Ferritin 158.2 10.0 - 291.0 ng/mL  CBC   Collection Time: 10/02/23  9:09 AM  Result Value Ref Range   WBC 6.8 4.0 - 10.5 K/uL   RBC 4.53 3.87 - 5.11 Mil/uL   Platelets 223.0 150.0 - 400.0 K/uL   Hemoglobin 12.7 12.0 - 15.0 g/dL   HCT 21.3 08.6 - 57.8 %   MCV 85.9 78.0 - 100.0 fl   MCHC 32.7 30.0 - 36.0 g/dL   RDW 46.9 62.9 - 52.8 %

## 2023-09-24 NOTE — Patient Instructions (Addendum)
 It was good to see you again today, I will be in touch with your blood work asap I will complete your form once your TB testing comes in and fax for you Please plan to see me in 6 months assuming all is well Continue OTC vitamin D   Add daily singulair to your allergy regimen as needed; if not helpful you don't have to continue taking it

## 2023-09-29 ENCOUNTER — Other Ambulatory Visit (HOSPITAL_BASED_OUTPATIENT_CLINIC_OR_DEPARTMENT_OTHER): Payer: Self-pay

## 2023-10-02 ENCOUNTER — Ambulatory Visit: Admitting: Family Medicine

## 2023-10-02 ENCOUNTER — Other Ambulatory Visit (HOSPITAL_BASED_OUTPATIENT_CLINIC_OR_DEPARTMENT_OTHER): Payer: Self-pay

## 2023-10-02 ENCOUNTER — Encounter: Payer: Self-pay | Admitting: Family Medicine

## 2023-10-02 VITALS — BP 132/80 | HR 63 | Temp 97.9°F | Resp 18 | Ht 59.0 in | Wt 180.4 lb

## 2023-10-02 DIAGNOSIS — E785 Hyperlipidemia, unspecified: Secondary | ICD-10-CM

## 2023-10-02 DIAGNOSIS — Z111 Encounter for screening for respiratory tuberculosis: Secondary | ICD-10-CM

## 2023-10-02 DIAGNOSIS — E119 Type 2 diabetes mellitus without complications: Secondary | ICD-10-CM

## 2023-10-02 DIAGNOSIS — D509 Iron deficiency anemia, unspecified: Secondary | ICD-10-CM

## 2023-10-02 DIAGNOSIS — E1169 Type 2 diabetes mellitus with other specified complication: Secondary | ICD-10-CM

## 2023-10-02 DIAGNOSIS — J301 Allergic rhinitis due to pollen: Secondary | ICD-10-CM

## 2023-10-02 DIAGNOSIS — I10 Essential (primary) hypertension: Secondary | ICD-10-CM

## 2023-10-02 LAB — HEMOGLOBIN A1C: Hgb A1c MFr Bld: 7.9 % — ABNORMAL HIGH (ref 4.6–6.5)

## 2023-10-02 LAB — CBC
HCT: 38.9 % (ref 36.0–46.0)
Hemoglobin: 12.7 g/dL (ref 12.0–15.0)
MCHC: 32.7 g/dL (ref 30.0–36.0)
MCV: 85.9 fl (ref 78.0–100.0)
Platelets: 223 10*3/uL (ref 150.0–400.0)
RBC: 4.53 Mil/uL (ref 3.87–5.11)
RDW: 14 % (ref 11.5–15.5)
WBC: 6.8 10*3/uL (ref 4.0–10.5)

## 2023-10-02 LAB — BASIC METABOLIC PANEL WITH GFR
BUN: 10 mg/dL (ref 6–23)
CO2: 26 meq/L (ref 19–32)
Calcium: 9.6 mg/dL (ref 8.4–10.5)
Chloride: 102 meq/L (ref 96–112)
Creatinine, Ser: 0.62 mg/dL (ref 0.40–1.20)
GFR: 100.36 mL/min (ref >60.00)
Glucose, Bld: 147 mg/dL — ABNORMAL HIGH (ref 70–99)
Potassium: 4 meq/L (ref 3.5–5.1)
Sodium: 139 meq/L (ref 135–145)

## 2023-10-02 LAB — FERRITIN: Ferritin: 158.2 ng/mL (ref 10.0–291.0)

## 2023-10-02 MED ORDER — MONTELUKAST SODIUM 10 MG PO TABS
10.0000 mg | ORAL_TABLET | Freq: Every day | ORAL | 1 refills | Status: DC
Start: 2023-10-02 — End: 2024-04-06
  Filled 2023-10-02: qty 90, 90d supply, fill #0
  Filled 2023-12-23: qty 90, 90d supply, fill #1

## 2023-10-03 ENCOUNTER — Other Ambulatory Visit (HOSPITAL_BASED_OUTPATIENT_CLINIC_OR_DEPARTMENT_OTHER): Payer: Self-pay

## 2023-10-03 MED ORDER — ROSUVASTATIN CALCIUM 10 MG PO TABS
10.0000 mg | ORAL_TABLET | Freq: Every day | ORAL | 3 refills | Status: AC
  Filled 2023-10-03: qty 90, 90d supply, fill #0
  Filled 2023-12-23: qty 90, 90d supply, fill #1
  Filled 2024-03-23: qty 90, 90d supply, fill #2
  Filled 2024-06-25: qty 90, 90d supply, fill #3

## 2023-10-04 ENCOUNTER — Other Ambulatory Visit (HOSPITAL_BASED_OUTPATIENT_CLINIC_OR_DEPARTMENT_OTHER): Payer: Self-pay | Admitting: Family Medicine

## 2023-10-04 ENCOUNTER — Encounter: Payer: Self-pay | Admitting: Family Medicine

## 2023-10-04 DIAGNOSIS — Z1231 Encounter for screening mammogram for malignant neoplasm of breast: Secondary | ICD-10-CM

## 2023-10-04 LAB — QUANTIFERON-TB GOLD PLUS
Mitogen-NIL: 8.32 [IU]/mL
NIL: 0.02 [IU]/mL
QuantiFERON-TB Gold Plus: NEGATIVE
TB1-NIL: <0 [IU]/mL
TB2-NIL: <0 [IU]/mL

## 2023-10-09 ENCOUNTER — Ambulatory Visit: Admitting: Family Medicine

## 2023-11-20 ENCOUNTER — Ambulatory Visit (HOSPITAL_BASED_OUTPATIENT_CLINIC_OR_DEPARTMENT_OTHER)
Admission: RE | Admit: 2023-11-20 | Discharge: 2023-11-20 | Disposition: A | Discharge status: Home / Self Care | Source: Ambulatory Visit | Attending: Family Medicine | Admitting: Family Medicine

## 2023-11-20 ENCOUNTER — Encounter (HOSPITAL_BASED_OUTPATIENT_CLINIC_OR_DEPARTMENT_OTHER): Payer: Self-pay

## 2023-11-20 DIAGNOSIS — Z1231 Encounter for screening mammogram for malignant neoplasm of breast: Secondary | ICD-10-CM | POA: Insufficient documentation

## 2023-12-25 ENCOUNTER — Other Ambulatory Visit: Payer: Self-pay

## 2023-12-25 ENCOUNTER — Other Ambulatory Visit (HOSPITAL_BASED_OUTPATIENT_CLINIC_OR_DEPARTMENT_OTHER): Payer: Self-pay

## 2024-02-16 ENCOUNTER — Encounter (HOSPITAL_BASED_OUTPATIENT_CLINIC_OR_DEPARTMENT_OTHER): Payer: Self-pay

## 2024-02-16 ENCOUNTER — Emergency Department (HOSPITAL_BASED_OUTPATIENT_CLINIC_OR_DEPARTMENT_OTHER)

## 2024-02-16 ENCOUNTER — Other Ambulatory Visit: Payer: Self-pay

## 2024-02-16 ENCOUNTER — Emergency Department (HOSPITAL_BASED_OUTPATIENT_CLINIC_OR_DEPARTMENT_OTHER): Admission: EM | Admit: 2024-02-16 | Discharge: 2024-02-16 | Disposition: A | Discharge status: Home / Self Care

## 2024-02-16 DIAGNOSIS — R0982 Postnasal drip: Secondary | ICD-10-CM | POA: Insufficient documentation

## 2024-02-16 DIAGNOSIS — Z91040 Latex allergy status: Secondary | ICD-10-CM | POA: Diagnosis not present

## 2024-02-16 DIAGNOSIS — M779 Enthesopathy, unspecified: Secondary | ICD-10-CM

## 2024-02-16 DIAGNOSIS — M6520 Calcific tendinitis, unspecified site: Secondary | ICD-10-CM | POA: Insufficient documentation

## 2024-02-16 DIAGNOSIS — M542 Cervicalgia: Secondary | ICD-10-CM | POA: Diagnosis present

## 2024-02-16 DIAGNOSIS — R519 Headache, unspecified: Secondary | ICD-10-CM | POA: Insufficient documentation

## 2024-02-16 LAB — RESP PANEL BY RT-PCR (RSV, FLU A&B, COVID)  RVPGX2
Influenza A by PCR: NEGATIVE
Influenza B by PCR: NEGATIVE
Resp Syncytial Virus by PCR: NEGATIVE
SARS Coronavirus 2 by RT PCR: NEGATIVE

## 2024-02-16 LAB — COMPREHENSIVE METABOLIC PANEL WITH GFR
ALT: 25 U/L (ref 0–44)
AST: 21 U/L (ref 15–41)
Albumin: 4.7 g/dL (ref 3.5–5.0)
Alkaline Phosphatase: 120 U/L (ref 38–126)
Anion gap: 12 (ref 5–15)
BUN: 12 mg/dL (ref 6–20)
CO2: 25 mmol/L (ref 22–32)
Calcium: 9.9 mg/dL (ref 8.9–10.3)
Chloride: 100 mmol/L (ref 98–111)
Creatinine, Ser: 0.8 mg/dL (ref 0.44–1.00)
GFR, Estimated: >60 mL/min (ref >60)
Glucose, Bld: 228 mg/dL — ABNORMAL HIGH (ref 70–99)
Potassium: 4.2 mmol/L (ref 3.5–5.1)
Sodium: 138 mmol/L (ref 135–145)
Total Bilirubin: 0.3 mg/dL (ref 0.0–1.2)
Total Protein: 7.8 g/dL (ref 6.5–8.1)

## 2024-02-16 LAB — CBC WITH DIFFERENTIAL/PLATELET
Abs Immature Granulocytes: 0.03 K/uL (ref 0.00–0.07)
Basophils Absolute: 0 K/uL (ref 0.0–0.1)
Basophils Relative: 0 %
Eosinophils Absolute: 0.3 K/uL (ref 0.0–0.5)
Eosinophils Relative: 3 %
HCT: 37.1 % (ref 36.0–46.0)
Hemoglobin: 12.1 g/dL (ref 12.0–15.0)
Immature Granulocytes: 0 %
Lymphocytes Relative: 30 %
Lymphs Abs: 2.8 K/uL (ref 0.7–4.0)
MCH: 27.4 pg (ref 26.0–34.0)
MCHC: 32.6 g/dL (ref 30.0–36.0)
MCV: 84.1 fL (ref 80.0–100.0)
Monocytes Absolute: 0.8 K/uL (ref 0.1–1.0)
Monocytes Relative: 9 %
Neutro Abs: 5.3 K/uL (ref 1.7–7.7)
Neutrophils Relative %: 58 %
Platelets: 229 K/uL (ref 150–400)
RBC: 4.41 MIL/uL (ref 3.87–5.11)
RDW: 13.2 % (ref 11.5–15.5)
WBC: 9.3 K/uL (ref 4.0–10.5)
nRBC: 0 % (ref 0.0–0.2)

## 2024-02-16 LAB — GROUP A STREP BY PCR: Group A Strep by PCR: NOT DETECTED

## 2024-02-16 MED ORDER — PREDNISONE 10 MG PO TABS: 30.0000 mg | ORAL_TABLET | Freq: Every day | ORAL | 0 refills | Status: DC

## 2024-02-16 MED ORDER — KETOROLAC TROMETHAMINE 15 MG/ML IJ SOLN
15.0000 mg | Freq: Once | INTRAMUSCULAR | Status: AC
  Administered 2024-02-16: 15 mg via INTRAMUSCULAR
  Filled 2024-02-16: qty 1

## 2024-02-16 MED ORDER — IOHEXOL 300 MG/ML  SOLN
75.0000 mL | Freq: Once | INTRAMUSCULAR | Status: AC | PRN
  Administered 2024-02-16: 75 mL via INTRAVENOUS

## 2024-02-16 MED ORDER — DEXAMETHASONE 10 MG/ML FOR PEDIATRIC ORAL USE
6.0000 mg | Freq: Once | INTRAMUSCULAR | Status: AC
  Administered 2024-02-16: 6 mg via ORAL
  Filled 2024-02-16: qty 1

## 2024-02-16 NOTE — ED Notes (Signed)
 Patient returned from CT

## 2024-02-16 NOTE — ED Provider Notes (Signed)
 [place] [facility] Provider Note   CSN: [id number] Arrival date & time: 02/16/24  1727     Patient presents with: Neck Pain and Headache   Deborah [name] is a 53 y.o. female.    Neck Pain Associated symptoms: headaches   Headache Associated symptoms: neck pain      Patient presents because of neck pain.  She is having bilateral neck pain on the anterior aspect of her neck.  Endorses a postnasal drip as well.  Endorses some pain whenever she swallows.  Endorses some pain whenever she moves her head back-and-forth.  No midline neck pain.  Is all in the anterior section.  No pain with flexion of her neck.  No fever no chills.  No light sensitivity.  Patient does endorse as above some postnasal drip.  Niece recently hospitalized due to COVID-19.  She is an alternate between Tylenol  and ibuprofen  with continued neck pain.  Able to tolerate secretions and tolerate p.o. no obvious swelling that she can appreciate  Previous medical history reviewed : Patient was last seen in the ED in June 2024 presented because of cough.  Negative workup at that time   Prior to Admission medications   Medication Sig Start Date End Date Taking? Authorizing Provider  predniSONE  (DELTASONE ) 10 MG tablet Take 3 tablets (30 mg total) by mouth daily. 02/16/24  Yes Simon Lavonia SAILOR, MD  allopurinol  (ZYLOPRIM ) 100 MG tablet Take 1 tablet (100 mg total) by mouth daily. 03/09/23   Copland, Harlene BROCKS, MD  amLODipine  (NORVASC ) 10 MG tablet Take 1 tablet (10 mg total) by mouth daily. 03/09/23   Copland, Harlene BROCKS, MD  Blood Glucose Monitoring Suppl (ONETOUCH VERIO FLEX SYSTEM) w/Device KIT Use to check blood sugars once daily. 06/27/23   Copland, Harlene BROCKS, MD  cetirizine  (ZYRTEC  ALLERGY) 10 MG tablet Take 1 tablet (10 mg total) by mouth daily. 11/12/22   Henderly, Britni A, PA-C  glipiZIDE  (GLUCOTROL  XL) 10 MG 24 hr tablet Take 1 tablet (10 mg total) by mouth daily with breakfast.  03/09/23 05/07/24  Copland, Harlene BROCKS, MD  glucose blood (ONETOUCH VERIO) test strip Use to check blood sugars once daily. 06/27/23   Copland, Harlene BROCKS, MD  lisinopril  (ZESTRIL ) 10 MG tablet Take 1 tablet (10 mg total) by mouth daily. 03/09/23   Copland, Harlene BROCKS, MD  metFORMIN  (GLUCOPHAGE ) 1000 MG tablet Take 1 tablet (1,000 mg total) by mouth 2 (two) times daily. 03/09/23   Copland, Harlene BROCKS, MD  montelukast  (SINGULAIR ) 10 MG tablet Take 1 tablet (10 mg total) by mouth at bedtime. 10/02/23   Copland, Harlene BROCKS, MD  OneTouch UltraSoft 2 Lancets MISC Use 1 lancet as directed. 06/27/23   Copland, Harlene BROCKS, MD  promethazine -dextromethorphan (PROMETHAZINE -DM) 6.25-15 MG/5ML syrup Take 5 mLs by mouth 4 (four) times daily as needed for cough. 03/09/23   Copland, Harlene BROCKS, MD  rosuvastatin  (CRESTOR ) 10 MG tablet Take 1 tablet (10 mg total) by mouth daily. 10/03/23   Copland, Harlene BROCKS, MD  SUMAtriptan  (IMITREX ) 50 MG tablet Take 1 tablet (50 mg total) by mouth as directed. May repeat in 2 hours if headache persists or recurs. 07/18/23   Copland, Harlene BROCKS, MD    Allergies: Adhesive [tape], Cephalexin, Hydrocodone , Neomycin-bacitracin zn-polymyx, Ferrous sulfate, Venofer  [iron  sucrose], Sertraline, and Latex    Review of Systems  Musculoskeletal:  Positive for neck pain.  Neurological:  Positive for headaches.    Updated Vital Signs BP (!) 160/85  Pulse 83   Temp 97.9 F (36.6 C)   Resp 16   Ht 4' 11 (1.499 m)   Wt 81.8 kg   SpO2 98%   BMI 36.42 kg/m   Physical Exam Vitals and nursing note reviewed.  Constitutional:      General: She is not in acute distress.    Appearance: She is well-developed.  HENT:     Head: Normocephalic and atraumatic.  Eyes:     Conjunctiva/sclera: Conjunctivae normal.  Neck:   Cardiovascular:     Rate and Rhythm: Normal rate and regular rhythm.     Heart sounds: No murmur heard. Pulmonary:     Effort: Pulmonary effort is normal. No respiratory distress.      Breath sounds: Normal breath sounds.  Abdominal:     Palpations: Abdomen is soft.     Tenderness: There is no abdominal tenderness.  Musculoskeletal:        General: No swelling.     Cervical back: Neck supple.  Skin:    General: Skin is warm and dry.     Capillary Refill: Capillary refill takes less than 2 seconds.  Neurological:     Mental Status: She is alert.  Psychiatric:        Mood and Affect: Mood normal.     (all labs ordered are listed, but only abnormal results are displayed) Labs Reviewed  COMPREHENSIVE METABOLIC PANEL WITH GFR - Abnormal; Notable for the following components:      Result Value   Glucose, Bld 228 (*)    All other components within normal limits  RESP PANEL BY RT-PCR (RSV, FLU A&B, COVID)  [id number]  GROUP A STREP BY PCR  CBC WITH DIFFERENTIAL/PLATELET    EKG: None  Radiology: CT Soft Tissue Neck W Contrast Result Date: 02/16/2024 CLINICAL DATA:  Initial evaluation for acute right neck pain. EXAM: CT NECK WITH CONTRAST TECHNIQUE: Multidetector CT imaging of the neck was performed using the standard protocol following the bolus administration of intravenous contrast. RADIATION DOSE REDUCTION: This exam was performed according to the departmental dose-optimization program which includes automated exposure control, adjustment of the mA and/or kV according to patient size and/or use of iterative reconstruction technique. CONTRAST:  75mL OMNIPAQUE  IOHEXOL  300 MG/ML  SOLN COMPARISON:  None Available. FINDINGS: Pharynx and larynx: Oral cavity within normal limits. Palatine tonsils symmetric and within normal limits. Parapharyngeal fat maintained. Nasopharynx normal. Small layering retropharyngeal effusion present (series 3, image 58 Smudgy calcifications noted just inferior to the anterior arch of C1. Findings suggestive of acute longus colli tendinitis. No loculated retropharyngeal collections. Supraglottic airway remains patent. Negative epiglottis. Glottis  normal. Subglottic airway clear. Salivary glands: Salivary glands including the parotid and submandibular glands are within normal limits. Thyroid : 7 mm left thyroid  nodule noted, of doubtful significance given size and patient age, no follow-up imaging recommended (ref: J Am Coll Radiol. 2015 Feb;12(2): 143-50). Lymph nodes: No enlarged or pathologic lymph nodes within the neck. Vascular: Normal intravascular enhancement seen within the neck. Mild aortic atherosclerosis. Limited intracranial: Unremarkable. Visualized orbits: Mild bilateral proptosis. Otherwise unremarkable. Mastoids and visualized paranasal sinuses: Mild scattered mucosal thickening present about the ethmoidal air cells. Paranasal sinuses are otherwise clear. Visualized mastoids and middle ear cavities are clear. Skeleton: No worrisome osseous lesions. Mild-to-moderate spondylosis at C5-6 and C6-7. Upper chest: No other acute finding. Other: None. IMPRESSION: 1. Small layering retropharyngeal effusion with smudgy calcifications just inferior to the anterior arch of C1. Findings most supportive of acute longus colli  tendinitis. Sequelae of acute infectious pharyngitis would be the primary differential consideration, but is felt to be less likely. No loculated retropharyngeal collections. 2. No other acute abnormality within the neck. 3. Aortic Atherosclerosis (ICD10-I70.0). Electronically Signed   By: Morene Hoard M.D.   On: 02/16/2024 20:38     Procedures   Medications Ordered in the ED  ketorolac  (TORADOL ) 15 MG/ML injection 15 mg (15 mg Intramuscular Given 02/16/24 1823)  iohexol  (OMNIPAQUE ) 300 MG/ML solution 75 mL (75 mLs Intravenous Contrast Given 02/16/24 2008)  dexamethasone  (DECADRON ) 10 MG/ML injection for Pediatric ORAL use 6 mg (6 mg Oral Given 02/16/24 2116)                                    Medical Decision Making Amount and/or Complexity of Data Reviewed Labs: ordered. Radiology: ordered.  Risk Prescription  drug management.     Patient presents because of neck pain.  She is having bilateral neck pain on the anterior aspect of her neck.  Endorses a postnasal drip as well.  Endorses some pain whenever she swallows.  Endorses some pain whenever she moves her head back-and-forth.  No midline neck pain.  Is all in the anterior section.  No pain with flexion of her neck.  No fever no chills.  No light sensitivity.  Patient does endorse as above some postnasal drip.  Niece recently hospitalized due to COVID-19.  She is an alternate between Tylenol  and ibuprofen  with continued neck pain.  Able to tolerate secretions and tolerate p.o. no obvious swelling that she can appreciate  Previous medical history reviewed : Patient was last seen in the ED in June 2024 presented because of cough.  Negative workup at that time  Upon exam, patient hemodynamically stable.  Afebrile.  ANO x 3 GCS 15.  No focal deficits.  No meningeal signs.  Negative Kernig and Brudzinski's.  No pain with flexion of the neck.  Afebrile.   Patient had a clear oropharynx.  No obvious peritonsillar abscess.  No concerns for Ludwig's angina.  Patient tolerating secretions without difficulty.  Initially obtain viral respiratory panel as well as strep testing.  This is unremarkable.  Given this, did obtain further laboratory workup as well as imaging.  Laboratory workup unremarkable.  No leukocytosis or left shift.  I did go ahead and obtain a CT scan of the patient's neck to rule out an, deep space infection or peritonsillar abscess though my suspicion for this was low.  Interestingly, findings consistent for acute longus colli tendinitis.  Patient received steroids here in the ED.  Also received Toradol .  Patient states that all of her symptoms subsequently resolved.  She is mentating well.  Tolerating secretions.  No muffled voice or any other concerning findings to be indicative of epiglottitis or other pathology.  Patient subsequent  discharged home prednisone .       Final diagnoses:  Neck pain  Tendonitis    ED Discharge Orders          Ordered    predniSONE  (DELTASONE ) 10 MG tablet  Daily        02/16/24 2157               Simon Lavonia SAILOR, MD 02/16/24 2258

## 2024-02-16 NOTE — ED Triage Notes (Signed)
 Patient arrives POV with complaints of worsening neck pain and pain with swallowing x4 days. Patient also reports pain with moving her neck as well.

## 2024-02-16 NOTE — ED Notes (Signed)
 Patient transported to CT

## 2024-02-16 NOTE — Discharge Instructions (Addendum)
 For pain, you can take 1000 mg of Tylenol  or 1 g of Tylenol  every 6-8 hours.  Do not exceed more than 4000 mg or 4 g in a 24-hour period. Once you finish the prednisone ,  you can also take ibuprofen  600 to 800 mg every 6-8 hours as well.  Do not take this high-dose ibuprofen  for greater than a week. Do not take the prednisone  and ibuprofen  at the same time.    If you have any, difficulty speaking, difficulty eating or drinking, voice changes, swelling of the throat, please come to the ED for further evaluation.

## 2024-03-04 ENCOUNTER — Other Ambulatory Visit (HOSPITAL_BASED_OUTPATIENT_CLINIC_OR_DEPARTMENT_OTHER): Payer: Self-pay

## 2024-03-04 MED ORDER — COMIRNATY 30 MCG/0.3ML IM SUSY
0.3000 mL | PREFILLED_SYRINGE | Freq: Once | INTRAMUSCULAR | 0 refills | Status: AC
  Filled 2024-03-04: qty 0.3, 1d supply, fill #0

## 2024-03-04 MED ORDER — FLUZONE 0.5 ML IM SUSY
0.5000 mL | PREFILLED_SYRINGE | Freq: Once | INTRAMUSCULAR | 0 refills | Status: AC
  Filled 2024-03-04: qty 0.5, 1d supply, fill #0

## 2024-03-23 ENCOUNTER — Other Ambulatory Visit: Payer: Self-pay | Admitting: Family Medicine

## 2024-03-23 DIAGNOSIS — E118 Type 2 diabetes mellitus with unspecified complications: Secondary | ICD-10-CM

## 2024-03-23 DIAGNOSIS — I10 Essential (primary) hypertension: Secondary | ICD-10-CM

## 2024-03-23 DIAGNOSIS — M1A09X0 Idiopathic chronic gout, multiple sites, without tophus (tophi): Secondary | ICD-10-CM

## 2024-03-25 ENCOUNTER — Other Ambulatory Visit (HOSPITAL_BASED_OUTPATIENT_CLINIC_OR_DEPARTMENT_OTHER): Payer: Self-pay

## 2024-03-25 ENCOUNTER — Other Ambulatory Visit: Payer: Self-pay

## 2024-03-25 MED ORDER — ALLOPURINOL 100 MG PO TABS
100.0000 mg | ORAL_TABLET | Freq: Every day | ORAL | 0 refills | Status: DC
  Filled 2024-03-25: qty 90, 90d supply, fill #0

## 2024-03-25 MED ORDER — AMLODIPINE BESYLATE 10 MG PO TABS
10.0000 mg | ORAL_TABLET | Freq: Every day | ORAL | 0 refills | Status: DC
  Filled 2024-03-25: qty 90, 90d supply, fill #0

## 2024-03-25 MED ORDER — LISINOPRIL 10 MG PO TABS
10.0000 mg | ORAL_TABLET | Freq: Every day | ORAL | 0 refills | Status: DC
  Filled 2024-03-25: qty 90, 90d supply, fill #0

## 2024-04-06 NOTE — Patient Instructions (Signed)
It was great to see you again today!  

## 2024-04-06 NOTE — Progress Notes (Addendum)
 [facility] [address], [address] [place], [place] [address] [phone]  Date:  04/08/2024   Name:  Deborah [name]   DOB:  09-23-1970   MRN:  [id number]  PCP:  Deborah Harlene BROCKS, MD    Chief Complaint: No chief complaint on file.   History of Present Illness:  Deborah [name] is a 53 y.o. very pleasant female patient who presents with the following:  Patient seen today for physical and follow-up of her chronic health concerns I saw her most recently in April History of diabetes, dyslipidemia, hypertension, anemia related to menstrual blood losses now menopausal, gout  She is studying social work at [employer] A1c in April 7.9%-I recommended adding a GLP-1 at that time but she did not wish to do so.  She did start on Crestor  however  Flu shot is up-to-date Can update labs-however she did have a CMP and CBC per the ER last month Needs urine micro  foot exam- update today  Mammogram, Pap up-to-date Colonoscopy completed 2020  Allopurinol  Amlodipine  Glipizide  10 Lisinopril  Metformin  thousand twice daily Crestor   Discussed the use of AI scribe software for clinical note transcription with the patient, who gave verbal consent to proceed.  History of Present Illness Deborah [name] is a 53 year old female who presents with a persistent cough and eye irritation.  She has been experiencing a persistent cough for the past month. Initially, she used daytime and nighttime cough syrup, which was later discontinued. A pharmacist recommended Sudafed, which alleviated her headache but did not relieve the cough. She has since tried nasal decongestant pills from [employer] and [employer], which have also been ineffective. She continues to use cough syrup to manage the cough and reports coughing up phlegm.  She reports eye irritation that began on Friday, characterized by itching, burning, and a sharp tingling pain on her eyelid. She has  been applying a warm compress at night due to her schedule with school and work. She wears glasses and reports her vision is normal despite the eye irritation.  Her current medications include allopurinol , amlodipine , glipizide , lisinopril , and metformin . She also takes over-the-counter vitamin D . She recently received her flu and COVID shots.     Patient Active Problem List   Diagnosis Date Noted   Diabetes (HCC) 10/05/2015   Hyperlipidemia associated with type 2 diabetes mellitus (HCC) 12/05/2008   Fatty liver 10/20/2008   NUMMULAR ECZEMA 05/22/2008   ANEMIA, IRON  DEFICIENCY 04/03/2007   SMOKER 11/28/2006   HYPERTENSION, MILD 11/28/2006   HEMORRHOIDS, INTERNAL W/O COMPLICATION 11/28/2006   RHINITIS, ALLERGIC, DUE TO POLLEN 11/28/2006   GERD 11/28/2006   DERMATITIS, ATOPIC 11/28/2006   COCAINE ABUSE, HX OF 11/28/2006   LIVER FUNCTION TESTS, ABNORMAL 09/14/2006   GOUT 06/25/2004   MORTON'S NEUROMA, 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    Past Medical History:  Diagnosis Date   Allergy    Anemia    Arthritis    Diabetes mellitus    Eczema    GERD (gastroesophageal reflux disease)    Gout    Hyperlipidemia    no meds   Hypertension    Neuromuscular disorder (HCC)    Plantar fasciitis    Polysubstance abuse (HCC)    ETOH and Cocaine   Seasonal allergies     Past Surgical History:  Procedure Laterality Date   ANAL RECTAL MANOMETRY N/A 04/20/2020   Procedure: ANO RECTAL MANOMETRY;  Surgeon: Deborah Lonni HERO,  MD;  Location: [facility];  Service: General;  Laterality: N/A;   HEMORRHOID SURGERY      Social History   Tobacco Use   Smoking status: Former    Current packs/day: 0.00    Types: Cigarettes    Quit date: 04/24/2013    Years since quitting: 10.9   Smokeless tobacco: Former    Quit date: 03/14/2013  Vaping Use   Vaping status: Never Used  Substance Use Topics   Alcohol use: No    Comment: none since 03-14-13   Drug use: Not Currently    Types: Marijuana, Cocaine     Comment: Quit 04/14/2013 per pt.     Family History  Problem Relation Age of Onset   Hypertension Father    Diabetes Father    Cancer Father    Hypertension Other    Diabetes Other    Diabetes Mother    Hypertension Mother    Diabetes Maternal Grandmother    Hypertension Maternal Grandmother    Diabetes Paternal Grandfather    Colon cancer Neg Hx    Colon polyps Neg Hx    Esophageal cancer Neg Hx    Rectal cancer Neg Hx    Stomach cancer Neg Hx     Allergies  Allergen Reactions   Adhesive [Tape] Hives and Rash   Cephalexin Hives and Rash    REACTION: stomach cramps and hives   Hydrocodone  Other (See Comments)    headache   Neomycin-Bacitracin Zn-Polymyx Hives and Rash    REACTION: burning   Ferrous Sulfate Other (See Comments)    Iron  Infusion caused pt to pass out due to blood pressure drop    Venofer  [Iron  Sucrose] Other (See Comments)    Per report, Pt lethargic and slumped over to side of chair. BP low. Pt states that she was nauseated and hot.    Sertraline Hives and Itching   Latex Itching and Rash    Medication list has been reviewed and updated.  Current Outpatient Medications on File Prior to Visit  Medication Sig Dispense Refill   allopurinol  (ZYLOPRIM ) 100 MG tablet Take 1 tablet (100 mg total) by mouth daily. Needs appt 90 tablet 0   amLODipine  (NORVASC ) 10 MG tablet Take 1 tablet (10 mg total) by mouth daily. Needs appt 90 tablet 0   Blood Glucose Monitoring Suppl (ONETOUCH VERIO FLEX SYSTEM) w/Device KIT Use to check blood sugars once daily. 1 kit 0   cetirizine  (ZYRTEC  ALLERGY) 10 MG tablet Take 1 tablet (10 mg total) by mouth daily. 30 tablet 0   glipiZIDE  (GLUCOTROL  XL) 10 MG 24 hr tablet Take 1 tablet (10 mg total) by mouth daily with breakfast. 90 tablet 3   glucose blood (ONETOUCH VERIO) test strip Use to check blood sugars once daily. 100 each 12   lisinopril  (ZESTRIL ) 10 MG tablet Take 1 tablet (10 mg total) by mouth daily. Needs appt 90 tablet  0   metFORMIN  (GLUCOPHAGE ) 1000 MG tablet Take 1 tablet (1,000 mg total) by mouth 2 (two) times daily. 180 tablet 3   OneTouch UltraSoft 2 Lancets MISC Use 1 lancet as directed. 100 each 12   rosuvastatin  (CRESTOR ) 10 MG tablet Take 1 tablet (10 mg total) by mouth daily. 90 tablet 3   SUMAtriptan  (IMITREX ) 50 MG tablet Take 1 tablet (50 mg total) by mouth as directed. May repeat in 2 hours if headache persists or recurs. 9 tablet 3   promethazine -dextromethorphan (PROMETHAZINE -DM) 6.25-15 MG/5ML syrup Take 5 mLs by mouth  4 (four) times daily as needed for cough. 118 mL 0   No current facility-administered medications on file prior to visit.    Review of Systems:  As per HPI- otherwise negative.   Physical Examination: Vitals:   04/08/24 1335  BP: 129/62  Pulse: 91  Resp: 16  Temp: 98.5 F (36.9 C)  SpO2: 100%   Vitals:   04/08/24 1335  Weight: 181 lb 6.4 oz (82.3 kg)  Height: 4' 11 (1.499 m)   Body mass index is 36.64 kg/m. Ideal Body Weight: Weight in (lb) to have BMI = 25: 123.5  GEN: no acute distress.  Obese, stye left upper lid  HEENT: Atraumatic, Normocephalic.  Bilateral TM wnl, oropharynx normal.  PEERL,EOMI.   Ears and Nose: No external deformity. CV: RRR, No M/G/R. No JVD. No thrill. No extra heart sounds. PULM: CTA B, no wheezes, crackles, rhonchi. No retractions. No resp. distress. No accessory muscle use. ABD: S, NT, ND, +BS. No rebound. No HSM. EXTR: No c/c/e PSYCH: Normally interactive. Conversant.  Normal foot exam   Assessment and Plan: Physical exam  Essential hypertension  Controlled type 2 diabetes mellitus with complication, without long-term current use of insulin (HCC) - Plan: Hemoglobin A1c, Microalbumin / creatinine urine ratio  Hyperlipidemia associated with type 2 diabetes mellitus (HCC) - Plan: Lipid panel  Thyroid  disorder screening - Plan: TSH  Acute bronchitis, unspecified organism - Plan: doxycycline  (VIBRAMYCIN ) 100 MG  capsule  Assessment & Plan Adult Wellness Visit Routine adult wellness visit conducted. Discussed recent symptoms including cough and eye irritation. Reviewed current medications and refills. - Check cholesterol and A1c levels. - Perform diabetes foot check. - Refill medications for 90 days.  Acute bronchitis Cough persisting for a month, possibly due to acute bronchitis. Previous use of over-the-counter medications provided temporary relief. Doxycycline  prescribed to address potential bronchitis and possibly aid in resolving the eye issue. - Prescribe doxycycline  for potential bronchitis. - Advise prn continuation of nasal decongestant and Benadryl  if this alleviates symptoms.  Hordeolum (sty) of eyelid Sty on the eyelid causing itching and discomfort. Symptoms not completely alleviated by current management. Warm compresses recommended to facilitate drainage. Doxycycline  prescribed, which may also help with the sty. - Advise warm compresses several times a day to promote drainage. - Prescribe doxycycline , which may also help with the stye.  Type 2 diabetes mellitus Type 2 diabetes mellitus managed with current medications. Routine monitoring and management discussed. - Continue current diabetes management plan.may need to again offer GLP1 - Monitor A1c levels.  Essential hypertension Essential hypertension managed with current medications. - Continue current hypertension management plan.  Hyperlipidemia Hyperlipidemia associated with type 2 diabetes mellitus. Monitoring and management discussed. - Check cholesterol levels.  General Health Maintenance General health maintenance discussed, including vaccinations and screenings. Pneumonia booster shot to be administered. Flu and COVID vaccinations confirmed up to date. - Administer pneumonia booster shot. - Confirm flu and COVID vaccinations are up to date.  Signed Harlene Schroeder, MD  Received labs as below, message to  patient  Results for orders placed or performed in visit on 04/08/24  Hemoglobin A1c   Collection Time: 04/08/24  1:53 PM  Result Value Ref Range   Hgb A1c MFr Bld 10.3 (H) 4.6 - 6.5 %  TSH   Collection Time: 04/08/24  1:53 PM  Result Value Ref Range   TSH 1.11 0.35 - 5.50 uIU/mL  Lipid panel   Collection Time: 04/08/24  1:53 PM  Result Value Ref Range  Cholesterol 110 0 - 200 mg/dL   Triglycerides 06.9 0.0 - 149.0 mg/dL   HDL 32.79 >60.99 mg/dL   VLDL 81.3 0.0 - 59.9 mg/dL   LDL Cholesterol 24 0 - 99 mg/dL   Total CHOL/HDL Ratio 2    NonHDL 42.89     "

## 2024-04-08 ENCOUNTER — Other Ambulatory Visit (HOSPITAL_BASED_OUTPATIENT_CLINIC_OR_DEPARTMENT_OTHER): Payer: Self-pay

## 2024-04-08 ENCOUNTER — Ambulatory Visit (INDEPENDENT_AMBULATORY_CARE_PROVIDER_SITE_OTHER): Admitting: Family Medicine

## 2024-04-08 ENCOUNTER — Other Ambulatory Visit: Payer: Self-pay | Admitting: Family Medicine

## 2024-04-08 VITALS — BP 129/62 | HR 91 | Temp 98.5°F | Resp 16 | Ht 59.0 in | Wt 181.4 lb

## 2024-04-08 DIAGNOSIS — E1169 Type 2 diabetes mellitus with other specified complication: Secondary | ICD-10-CM | POA: Diagnosis not present

## 2024-04-08 DIAGNOSIS — E118 Type 2 diabetes mellitus with unspecified complications: Secondary | ICD-10-CM

## 2024-04-08 DIAGNOSIS — E785 Hyperlipidemia, unspecified: Secondary | ICD-10-CM | POA: Diagnosis not present

## 2024-04-08 DIAGNOSIS — Z1329 Encounter for screening for other suspected endocrine disorder: Secondary | ICD-10-CM | POA: Diagnosis not present

## 2024-04-08 DIAGNOSIS — Z23 Encounter for immunization: Secondary | ICD-10-CM

## 2024-04-08 DIAGNOSIS — Z0000 Encounter for general adult medical examination without abnormal findings: Secondary | ICD-10-CM

## 2024-04-08 DIAGNOSIS — H00014 Hordeolum externum left upper eyelid: Secondary | ICD-10-CM

## 2024-04-08 DIAGNOSIS — I10 Essential (primary) hypertension: Secondary | ICD-10-CM | POA: Diagnosis not present

## 2024-04-08 DIAGNOSIS — J209 Acute bronchitis, unspecified: Secondary | ICD-10-CM

## 2024-04-08 MED ORDER — DOXYCYCLINE HYCLATE 100 MG PO CAPS
100.0000 mg | ORAL_CAPSULE | Freq: Two times a day (BID) | ORAL | 0 refills | Status: DC
  Filled 2024-04-08: qty 20, 10d supply, fill #0

## 2024-04-08 NOTE — Telephone Encounter (Signed)
Awaiting lab results from today. 

## 2024-04-09 ENCOUNTER — Other Ambulatory Visit: Payer: Self-pay

## 2024-04-09 ENCOUNTER — Encounter: Payer: Self-pay | Admitting: Family Medicine

## 2024-04-09 ENCOUNTER — Other Ambulatory Visit (HOSPITAL_BASED_OUTPATIENT_CLINIC_OR_DEPARTMENT_OTHER): Payer: Self-pay

## 2024-04-09 DIAGNOSIS — E118 Type 2 diabetes mellitus with unspecified complications: Secondary | ICD-10-CM

## 2024-04-09 LAB — LIPID PANEL
Cholesterol: 110 mg/dL (ref 0–200)
HDL: 67.2 mg/dL (ref >39.00)
LDL Cholesterol: 24 mg/dL (ref 0–99)
NonHDL: 42.89
Total CHOL/HDL Ratio: 2
Triglycerides: 93 mg/dL (ref 0.0–149.0)
VLDL: 18.6 mg/dL (ref 0.0–40.0)

## 2024-04-09 LAB — TSH: TSH: 1.11 u[IU]/mL (ref 0.35–5.50)

## 2024-04-09 LAB — HEMOGLOBIN A1C: Hgb A1c MFr Bld: 10.3 % — ABNORMAL HIGH (ref 4.6–6.5)

## 2024-04-09 MED ORDER — METFORMIN HCL 1000 MG PO TABS
1000.0000 mg | ORAL_TABLET | Freq: Two times a day (BID) | ORAL | 3 refills | Status: DC
  Filled 2024-04-09: qty 180, 90d supply, fill #0

## 2024-04-10 ENCOUNTER — Other Ambulatory Visit (HOSPITAL_BASED_OUTPATIENT_CLINIC_OR_DEPARTMENT_OTHER): Payer: Self-pay

## 2024-04-10 MED ORDER — RYBELSUS 3 MG PO TABS: 3.0000 mg | ORAL_TABLET | Freq: Every day | ORAL | 0 refills | Status: DC

## 2024-04-10 MED ORDER — RYBELSUS 3 MG PO TABS
3.0000 mg | ORAL_TABLET | Freq: Every day | ORAL | 0 refills | Status: DC
  Filled 2024-04-10 – 2024-04-12 (×2): qty 30, 30d supply, fill #0

## 2024-04-10 NOTE — Addendum Note (Signed)
 Addended by: WATT RAISIN C on: 04/10/2024 09:34 PM   Modules accepted: Orders

## 2024-04-10 NOTE — Addendum Note (Signed)
 Addended by: WATT RAISIN C on: 04/10/2024 11:49 AM   Modules accepted: Orders

## 2024-04-11 ENCOUNTER — Other Ambulatory Visit (HOSPITAL_BASED_OUTPATIENT_CLINIC_OR_DEPARTMENT_OTHER): Payer: Self-pay

## 2024-04-12 ENCOUNTER — Other Ambulatory Visit (HOSPITAL_BASED_OUTPATIENT_CLINIC_OR_DEPARTMENT_OTHER): Payer: Self-pay

## 2024-04-12 ENCOUNTER — Other Ambulatory Visit (HOSPITAL_COMMUNITY): Payer: Self-pay

## 2024-04-18 ENCOUNTER — Encounter: Admitting: Family Medicine

## 2024-05-08 ENCOUNTER — Telehealth: Payer: Self-pay | Admitting: Pharmacist

## 2024-05-08 ENCOUNTER — Other Ambulatory Visit: Payer: Self-pay

## 2024-05-08 ENCOUNTER — Other Ambulatory Visit (HOSPITAL_BASED_OUTPATIENT_CLINIC_OR_DEPARTMENT_OTHER): Payer: Self-pay

## 2024-05-08 MED ORDER — RYBELSUS 7 MG PO TABS
7.0000 mg | ORAL_TABLET | Freq: Every day | ORAL | 1 refills | Status: AC
  Filled 2024-05-08 – 2024-05-16 (×2): qty 90, 90d supply, fill #0

## 2024-05-08 NOTE — Telephone Encounter (Signed)
 Called [employer] to check on to see if Accu-Chek test strips would be covered since I have an Accu-Chek glucometer I would give her but no test strips. [employer]'s preferred family of glucometer is Freestyle - neo or precision. [employer] representative could not provide me with information about what the expected cost to patient might be though.  Sent patient a message about above. If she would like I would send in Rx for the Freestyle and we could see how much Deborah [name] would cover.

## 2024-05-08 NOTE — Addendum Note (Signed)
 Addended by: WATT RAISIN C on: 05/08/2024 12:27 PM   Modules accepted: Orders

## 2024-05-10 ENCOUNTER — Encounter: Payer: Self-pay | Admitting: Family

## 2024-05-10 ENCOUNTER — Other Ambulatory Visit (HOSPITAL_BASED_OUTPATIENT_CLINIC_OR_DEPARTMENT_OTHER): Payer: Self-pay

## 2024-05-10 ENCOUNTER — Other Ambulatory Visit: Payer: Self-pay | Admitting: Pharmacist

## 2024-05-10 MED ORDER — FREESTYLE PRECISION NEO SYSTEM W/DEVICE KIT
PACK | 0 refills | Status: AC
  Filled 2024-05-10: qty 1, 30d supply, fill #0

## 2024-05-10 MED ORDER — FREESTYLE PRECISION NEO SYSTEM W/DEVICE KIT: PACK | 0 refills | Status: DC

## 2024-05-10 MED ORDER — FREESTYLE LANCETS MISC: 12 refills | Status: DC

## 2024-05-10 MED ORDER — FREESTYLE PRECISION NEO TEST VI STRP: ORAL_STRIP | 12 refills | Status: DC

## 2024-05-10 MED ORDER — FREESTYLE LANCETS MISC
12 refills | Status: AC
  Filled 2024-05-10: qty 100, 50d supply, fill #0

## 2024-05-10 MED ORDER — FREESTYLE PRECISION NEO TEST VI STRP
ORAL_STRIP | 12 refills | Status: AC
  Filled 2024-05-10: qty 100, 50d supply, fill #0

## 2024-05-10 NOTE — Progress Notes (Signed)
 Provided pharmacy with updated Rx for Freestyle Neo glucometer and testing supply. Freestyle products are preferred by her insurance. Also supplied copay care to lower cost of Rybelsus  to $10.

## 2024-05-10 NOTE — Addendum Note (Signed)
 Addended by: CARLA MILLING B on: 05/10/2024 03:26 PM   Modules accepted: Orders

## 2024-05-15 ENCOUNTER — Other Ambulatory Visit (HOSPITAL_BASED_OUTPATIENT_CLINIC_OR_DEPARTMENT_OTHER): Payer: Self-pay

## 2024-05-16 ENCOUNTER — Other Ambulatory Visit (HOSPITAL_BASED_OUTPATIENT_CLINIC_OR_DEPARTMENT_OTHER): Payer: Self-pay

## 2024-05-16 ENCOUNTER — Telehealth: Payer: Self-pay | Admitting: Family Medicine

## 2024-05-16 ENCOUNTER — Encounter: Payer: Self-pay | Admitting: Family

## 2024-05-16 NOTE — Telephone Encounter (Signed)
 Pt is asking if copay card for rybelsus  is something she needs to take with her to the pharmacy every time or just this once. Please let pt know

## 2024-05-17 ENCOUNTER — Encounter: Payer: Self-pay | Admitting: Family Medicine

## 2024-05-17 NOTE — Addendum Note (Signed)
 Addended by: WATT RAISIN C on: 05/17/2024 02:25 PM   Modules accepted: Orders

## 2024-06-19 ENCOUNTER — Telehealth: Payer: Self-pay | Admitting: Pharmacist

## 2024-06-19 NOTE — Telephone Encounter (Signed)
 Weight is 164lbs Blood glucose 131, 134 - checking every morning.  Not taking Rybelsus  7mg  daily due to migraines  - she is taking every other day. Migraines are improved with every other day dosing. Since her blood glucose has been good - OK to continue to take Rybelsus  every other day. We could lower dose to 3mg  daily but the coupon card only covers the 7mg  or 14mg  dose.  I also reminded her to make sure she is drink water / fluids during the day to avoid dehydration.  Advised patient to check blood glucose 1 or 2 times per week AFTER a meal and record. Continue to take daily in thr morning .   Patient will be due to have A1c recheck in February - made appointment for follow up with PCP today.   Madelin Ray, PharmD Clinical Pharmacist [facility]

## 2024-06-25 ENCOUNTER — Other Ambulatory Visit: Payer: Self-pay | Admitting: Family Medicine

## 2024-06-25 DIAGNOSIS — M1A09X0 Idiopathic chronic gout, multiple sites, without tophus (tophi): Secondary | ICD-10-CM

## 2024-06-25 DIAGNOSIS — I10 Essential (primary) hypertension: Secondary | ICD-10-CM

## 2024-06-25 DIAGNOSIS — E118 Type 2 diabetes mellitus with unspecified complications: Secondary | ICD-10-CM

## 2024-06-26 ENCOUNTER — Other Ambulatory Visit: Payer: Self-pay

## 2024-06-26 ENCOUNTER — Other Ambulatory Visit (HOSPITAL_BASED_OUTPATIENT_CLINIC_OR_DEPARTMENT_OTHER): Payer: Self-pay

## 2024-06-26 MED ORDER — LISINOPRIL 10 MG PO TABS
10.0000 mg | ORAL_TABLET | Freq: Every day | ORAL | 0 refills | Status: AC
  Filled 2024-06-26: qty 90, 90d supply, fill #0

## 2024-06-26 MED ORDER — AMLODIPINE BESYLATE 10 MG PO TABS
10.0000 mg | ORAL_TABLET | Freq: Every day | ORAL | 0 refills | Status: AC
  Filled 2024-06-26: qty 90, 90d supply, fill #0

## 2024-06-26 MED ORDER — ALLOPURINOL 100 MG PO TABS
100.0000 mg | ORAL_TABLET | Freq: Every day | ORAL | 0 refills | Status: AC
  Filled 2024-06-26: qty 90, 90d supply, fill #0

## 2024-07-03 LAB — HM DIABETES EYE EXAM

## 2024-07-08 NOTE — Progress Notes (Unsigned)
 [facility] [address] [place], [place] [address] [phone]  Date:  07/10/2024   Name:  Deborah [name]   DOB:  Jan 31, 1971   MRN:  [id number]  PCP:  Watt Harlene BROCKS, MD    Chief Complaint: No chief complaint on file.   History of Present Illness:  Deborah [name] is a 54 y.o. very pleasant female patient who presents with the following:  Patient seen today for diabetes follow-up.  I saw her most recently in November History of diabetes, dyslipidemia, hypertension, anemia related to menstrual blood losses now menopausal, gout  She is studying social work at [employer]  At our visit in November her A1c was 10.3%-we tried to get her started on a GLP-1, she selected Rybelsus  due to preferring oral medication  Amlodipine  10 Lisinopril  10 Metformin  thousand twice daily Rybelsus  7 mg Crestor   Discussed the use of AI scribe software for clinical note transcription with the patient, who gave verbal consent to proceed.  History of Present Illness     Patient Active Problem List   Diagnosis Date Noted   Diabetes (HCC) 10/05/2015   Hyperlipidemia associated with type 2 diabetes mellitus (HCC) 12/05/2008   Fatty liver 10/20/2008   NUMMULAR ECZEMA 05/22/2008   ANEMIA, IRON  DEFICIENCY 04/03/2007   SMOKER 11/28/2006   HYPERTENSION, MILD 11/28/2006   HEMORRHOIDS, INTERNAL W/O COMPLICATION 11/28/2006   RHINITIS, ALLERGIC, DUE TO POLLEN 11/28/2006   GERD 11/28/2006   DERMATITIS, ATOPIC 11/28/2006   COCAINE ABUSE, HX OF 11/28/2006   LIVER FUNCTION TESTS, ABNORMAL 09/14/2006   GOUT 06/25/2004   MORTON'S NEUROMA, 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    Past Medical History:  Diagnosis Date   Allergy    Anemia    Arthritis    Diabetes mellitus    Eczema    GERD (gastroesophageal reflux disease)    Gout    Hyperlipidemia    no meds   Hypertension    Neuromuscular disorder (HCC)    Plantar fasciitis    Polysubstance abuse (HCC)     ETOH and Cocaine   Seasonal allergies     Past Surgical History:  Procedure Laterality Date   ANAL RECTAL MANOMETRY N/A 04/20/2020   Procedure: ANO RECTAL MANOMETRY;  Surgeon: Teresa Lonni HERO, MD;  Location: [facility];  Service: General;  Laterality: N/A;   HEMORRHOID SURGERY      Social History[1]  Family History  Problem Relation Age of Onset   Hypertension Father    Diabetes Father    Cancer Father    Hypertension Other    Diabetes Other    Diabetes Mother    Hypertension Mother    Diabetes Maternal Grandmother    Hypertension Maternal Grandmother    Diabetes Paternal Grandfather    Colon cancer Neg Hx    Colon polyps Neg Hx    Esophageal cancer Neg Hx    Rectal cancer Neg Hx    Stomach cancer Neg Hx     Allergies[2]  Medication list has been reviewed and updated.  Medications Ordered Prior to Encounter[3]  Review of Systems:  As per HPI- otherwise negative.   Physical Examination: There were no vitals filed for this visit. There were no vitals filed for this visit. There is no height or weight on file to calculate BMI. Ideal Body Weight:    GEN: no acute distress. HEENT: Atraumatic, Normocephalic.  Ears and Nose: No external deformity. CV: RRR, No M/G/R. No  JVD. No thrill. No extra heart sounds. PULM: CTA B, no wheezes, crackles, rhonchi. No retractions. No resp. distress. No accessory muscle use. ABD: S, NT, ND, +BS. No rebound. No HSM. EXTR: No c/c/e PSYCH: Normally interactive. Conversant.    Assessment and Plan: No diagnosis found.  Assessment & Plan   Signed Harlene Schroeder, MD    [1]  Social History Tobacco Use   Smoking status: Former    Current packs/day: 0.00    Average packs/day: 0.5 packs/day    Types: Cigarettes    Quit date: 04/24/2013    Years since quitting: 11.2   Smokeless tobacco: Former    Quit date: 03/14/2013  Vaping Use   Vaping status: Never Used  Substance Use Topics   Alcohol use: No     Comment: none since 03-14-13   Drug use: Not Currently    Types: Marijuana, Cocaine    Comment: Quit 04/14/2013 per pt.   [2]  Allergies Allergen Reactions   Adhesive [Tape] Hives and Rash   Cephalexin Hives and Rash    REACTION: stomach cramps and hives   Hydrocodone  Other (See Comments)    headache   Neomycin-Bacitracin Zn-Polymyx Hives and Rash    REACTION: burning   Ferrous Sulfate Other (See Comments)    Iron  Infusion caused pt to pass out due to blood pressure drop    Venofer  [Iron  Sucrose] Other (See Comments)    Per report, Pt lethargic and slumped over to side of chair. BP low. Pt states that she was nauseated and hot.    Sertraline Hives and Itching   Latex Itching and Rash  [3]  Current Outpatient Medications on File Prior to Visit  Medication Sig Dispense Refill   allopurinol  (ZYLOPRIM ) 100 MG tablet Take 1 tablet (100 mg total) by mouth daily. Needs appt 90 tablet 0   amLODipine  (NORVASC ) 10 MG tablet Take 1 tablet (10 mg total) by mouth daily. Needs appt 90 tablet 0   Blood Glucose Monitoring Suppl (FREESTYLE PRECISION NEO SYSTEM) w/Device KIT Use to check blood glucose up to twice a day 1 kit 0   cetirizine  (ZYRTEC  ALLERGY) 10 MG tablet Take 1 tablet (10 mg total) by mouth daily. 30 tablet 0   doxycycline  (VIBRAMYCIN ) 100 MG capsule Take 1 capsule (100 mg total) by mouth 2 (two) times daily. 20 capsule 0   glucose blood (FREESTYLE PRECISION NEO TEST) test strip Use to check blood glucose twice a day. 100 each 12   Lancets (FREESTYLE) lancets Use as instructed to test twice daily. 100 each 12   lisinopril  (ZESTRIL ) 10 MG tablet Take 1 tablet (10 mg total) by mouth daily. Needs appt 90 tablet 0   metFORMIN  (GLUCOPHAGE ) 1000 MG tablet Take 1 tablet (1,000 mg total) by mouth 2 (two) times daily. 180 tablet 3   promethazine -dextromethorphan (PROMETHAZINE -DM) 6.25-15 MG/5ML syrup Take 5 mLs by mouth 4 (four) times daily as needed for cough. 118 mL 0   rosuvastatin  (CRESTOR )  10 MG tablet Take 1 tablet (10 mg total) by mouth daily. 90 tablet 3   Semaglutide  (RYBELSUS ) 7 MG TABS Take 1 tablet (7 mg total) by mouth daily. (Patient taking differently: Take 7 mg by mouth every other day.) 90 tablet 1   SUMAtriptan  (IMITREX ) 50 MG tablet Take 1 tablet (50 mg total) by mouth as directed. May repeat in 2 hours if headache persists or recurs. 9 tablet 3   No current facility-administered medications on file prior to visit.   "

## 2024-07-09 NOTE — Patient Instructions (Signed)
 It was good to see you today, I will be in touch with your labs.  Lets plan to recheck in 3 to 4 months

## 2024-07-10 ENCOUNTER — Ambulatory Visit: Admitting: Family Medicine

## 2024-07-10 ENCOUNTER — Encounter: Payer: Self-pay | Admitting: Family Medicine

## 2024-07-10 ENCOUNTER — Other Ambulatory Visit (HOSPITAL_BASED_OUTPATIENT_CLINIC_OR_DEPARTMENT_OTHER): Payer: Self-pay

## 2024-07-10 VITALS — BP 118/72 | HR 84 | Ht 59.0 in | Wt 162.0 lb

## 2024-07-10 DIAGNOSIS — M79676 Pain in unspecified toe(s): Secondary | ICD-10-CM

## 2024-07-10 DIAGNOSIS — E118 Type 2 diabetes mellitus with unspecified complications: Secondary | ICD-10-CM

## 2024-07-10 LAB — MICROALBUMIN / CREATININE URINE RATIO
Creatinine,U: 203 mg/dL
Microalb Creat Ratio: 6.5 mg/g (ref 0.0–30.0)
Microalb, Ur: 1.3 mg/dL (ref 0.7–1.9)

## 2024-07-10 LAB — HEMOGLOBIN A1C: Hgb A1c MFr Bld: 6.8 % — ABNORMAL HIGH (ref 4.6–6.5)

## 2024-07-10 MED ORDER — METFORMIN HCL 1000 MG PO TABS
1000.0000 mg | ORAL_TABLET | Freq: Two times a day (BID) | ORAL | 3 refills | Status: AC
  Filled 2024-07-10: qty 180, 90d supply, fill #0

## 2024-07-17 ENCOUNTER — Ambulatory Visit: Admitting: Podiatry
